# Patient Record
Sex: Male | Born: 1950
Health system: Southern US, Community
[De-identification: ages and names within clinical notes are randomized; demographics above are authoritative.]

## PROBLEM LIST (undated history)

## (undated) DIAGNOSIS — M159 Polyosteoarthritis, unspecified: Secondary | ICD-10-CM

## (undated) DIAGNOSIS — M1611 Unilateral primary osteoarthritis, right hip: Secondary | ICD-10-CM

## (undated) DIAGNOSIS — S63106A Unspecified dislocation of unspecified thumb, initial encounter: Secondary | ICD-10-CM

## (undated) DIAGNOSIS — M879 Osteonecrosis, unspecified: Secondary | ICD-10-CM

## (undated) DIAGNOSIS — M1990 Unspecified osteoarthritis, unspecified site: Secondary | ICD-10-CM

## (undated) DIAGNOSIS — R011 Cardiac murmur, unspecified: Secondary | ICD-10-CM

## (undated) DIAGNOSIS — K219 Gastro-esophageal reflux disease without esophagitis: Secondary | ICD-10-CM

## (undated) DIAGNOSIS — I10 Essential (primary) hypertension: Secondary | ICD-10-CM

## (undated) HISTORY — DX: Cardiac murmur, unspecified: R01.1

## (undated) HISTORY — DX: Gastro-esophageal reflux disease without esophagitis: K21.9

## (undated) HISTORY — DX: Polyosteoarthritis, unspecified: M15.9

## (undated) HISTORY — DX: Unspecified dislocation of unspecified thumb, initial encounter: S63.106A

## (undated) HISTORY — PX: NO PAST SURGERIES: SHX2092

## (undated) HISTORY — DX: Unilateral primary osteoarthritis, right hip: M16.11

## (undated) HISTORY — DX: Essential (primary) hypertension: I10

## (undated) HISTORY — DX: Unspecified osteoarthritis, unspecified site: M19.90

## (undated) HISTORY — DX: Osteonecrosis, unspecified: M87.9

---

## 2016-06-19 ENCOUNTER — Encounter: Payer: Self-pay | Admitting: Family Medicine

## 2016-06-19 ENCOUNTER — Ambulatory Visit (INDEPENDENT_AMBULATORY_CARE_PROVIDER_SITE_OTHER): Payer: BLUE CROSS/BLUE SHIELD | Admitting: Family Medicine

## 2016-06-19 VITALS — BP 162/66 | HR 81 | Temp 98.2°F | Resp 20 | Ht 66.0 in | Wt 219.0 lb

## 2016-06-19 DIAGNOSIS — R030 Elevated blood-pressure reading, without diagnosis of hypertension: Secondary | ICD-10-CM | POA: Diagnosis not present

## 2016-06-19 DIAGNOSIS — Z7689 Persons encountering health services in other specified circumstances: Secondary | ICD-10-CM

## 2016-06-19 DIAGNOSIS — Z6835 Body mass index (BMI) 35.0-35.9, adult: Secondary | ICD-10-CM | POA: Diagnosis not present

## 2016-06-19 DIAGNOSIS — L989 Disorder of the skin and subcutaneous tissue, unspecified: Secondary | ICD-10-CM | POA: Diagnosis not present

## 2016-06-19 DIAGNOSIS — E669 Obesity, unspecified: Secondary | ICD-10-CM | POA: Insufficient documentation

## 2016-06-19 DIAGNOSIS — Z87891 Personal history of nicotine dependence: Secondary | ICD-10-CM | POA: Insufficient documentation

## 2016-06-19 NOTE — Patient Instructions (Signed)
It was a pleasure meeting you today.  I hope you have a great holiday season.  I will place a referral to dermatology for you and they will call to set up an appt.   If you would like to have your fasting labs drawn before your physical appt let us know and schedule a lab appt 2 days prior to physical appt.   Please help [place] help you:  It is a privilege to be able to take care of great patients such as yourself. We are honored you have chosen [facility] for your Primary Care home. Below you will find basic instructions that you may need to access in the future. Please help [place] help you by reading the instructions, which cover many of the frequent questions we experience.   Prescription refills and request:  -In order to allow more efficient response time, please call your pharmacy for all refills. They will forward the request electronically to [place]. This allows for the quickest possible response. Request left on a nurse line can take longer to refill, since these are checked as time allows between office patients and other phone calls.  - refill request can take up to 3-5 working days to complete.  - If request is sent electronically and request is appropiate, it is usually completed in 1-2 business days.  - all patients will need to be seen routinely for all chronic medical conditions requiring prescription medications (see follow-up below). If you are overdue for follow up on your condition, you will be asked to make an appointment and we will call in enough medication to cover you until your appointment (up to 30 days).  - all controlled substances will require a face to face visit to request/refill.  - if you desire your prescriptions to go through a new pharmacy, and have an active script at original pharmacy, you will need to call your pharmacy and have scripts transferred to new pharmacy. This is completed between the pharmacy locations and not by your provider.    Results: If any images  or labs were ordered, it can take up to 1 week to get results depending on the test ordered and the lab/facility running and resulting the test. - Normal or stable results, which do not need further discussion, will be released to your mychart immediately with attached note to you. A call will not be generated for normal results. Please make certain to sign up for mychart. If you have questions on how to activate your mychart you can call the front office.  - If your results need further discussion, our office will attempt to contact you via phone, and if unable to reach you after 2 attempts, we will release your abnormal result to your mychart with instructions.  - All results will be automatically released in mychart after 1 week.  - Your provider will provide you with explanation and instruction on all relevant material in your results. Please keep in mind, results and labs may appear confusing or abnormal to the untrained eye, but it does not mean they are actually abnormal for you personally. If you have any questions about your results that are not covered, or you desire more detailed explanation than what was provided, you should make an appointment with your provider to do so.   Our office handles many outgoing and incoming calls daily. If we have not contacted you within 1 week about your results, please check your mychart to see if there is a  message first and if not, then contact our office.  In helping with this matter, you help decrease call volume, and therefore allow [place] to be able to respond to patients needs more efficiently.   Acute office visits (sick visit):  An acute visit is intended for a new problem and are scheduled in shorter time slots to allow schedule openings for patients with new problems. This is the appropriate visit to discuss a new problem. In order to provide you with excellent quality medical care with proper time for you to explain your problem, have an exam and receive  treatment with instructions, these appointments should be limited to one new problem per visit. If you experience a new problem, in which you desire to be addressed, please make an acute office visit, we save openings on the schedule to accommodate you. Please do not save your new problem for any other type of visit, let us take care of it properly and quickly for you.   Follow up visits:  Depending on your condition(s) your provider will need to see you routinely in order to provide you with quality care and prescribe medication(s). Most chronic conditions (Example: hypertension, Diabetes, depression/anxiety... etc), require visits a couple times a year. Your provider will instruct you on proper follow up for your personal medical conditions and history. Please make certain to make follow up appointments for your condition as instructed. Failing to do so could result in lapse in your medication treatment/refills. If you request a refill, and are overdue to be seen on a condition, we will always provide you with a 30 day script (once) to allow you time to schedule.    Medicare wellness (well visit): - we have a wonderful Nurse Maudie Mercury), that will meet with you and provide you will yearly medicare wellness visits. These visits should occur yearly (can not be scheduled less than 1 calendar year apart) and cover preventive health, immunizations, advance directives and screenings you are entitled to yearly through your medicare benefits. Do not miss out on your entitled benefits, this is when medicare will pay for these benefits to be ordered for you.  These are strongly encouraged by your provider and is the appropriate type of visit to make certain you are up to date with all preventive health benefits. If you have not had your medicare wellness exam in the last 12 months, please make certain to schedule one by calling the office and schedule your medicare wellness with Maudie Mercury as soon as possible.   Yearly physical  (well visit):  - Adults are recommended to be seen yearly for physicals. Check with your insurance and date of your last physical, most insurances require one calendar year between physicals. Physicals include all preventive health topics, screenings, medical exam and labs that are appropriate for gender/age and history. You may have fasting labs needed at this visit. This is a well visit (not a sick visit), acute topics should not be covered during this visit.  - Pediatric patients are seen more frequently when they are younger. Your provider will advise you on well child visit timing that is appropriate for your their age. - This is not a medicare wellness visit. Medicare wellness exams do not have an exam portion to the visit. Some medicare companies allow for a physical, some do not allow a yearly physical. If your medicare allows a yearly physical you can schedule the medicare wellness with our nurse Maudie Mercury and have your physical with your provider after, on the  same day. Please check with insurance for your full benefits.   Late Policy/No Shows:  - all new patients should arrive 15-30 minutes earlier than appointment to allow [place] time  to  obtain all personal demographics,  insurance information and for you to complete office paperwork. - All established patients should arrive 10-15 minutes earlier than appointment time to update all information and be checked in .  - In our best efforts to run on time, if you are late for your appointment you will be asked to either reschedule or if able, we will work you back into the schedule. There will be a wait time to work you back in the schedule,  depending on availability.  - If you are unable to make it to your appointment as scheduled, please call 24 hours ahead of time to allow [place] to fill the time slot with someone else who needs to be seen. If you do not cancel your appointment ahead of time, you may be charged a no show fee.

## 2016-06-19 NOTE — Progress Notes (Signed)
Patient ID: Paul Phillips, male  DOB: January 29, 1951, 65 y.o.   MRN: [id number] Patient Care Team    Relationship Specialty Notifications Start End  Ma Hillock, DO PCP - General Family Medicine  06/19/16     Subjective:  Paul Phillips is a 65 y.o.  male present for new patient establishment with complaints of skin lesion of his forehead.  All past medical history, surgical history, allergies, family history, immunizations, medications and social history were obtained and entered in the electronic medical record today. All recent labs, ED visits and hospitalizations within the last year were reviewed.  Patient states he has had a skin lesion of his left forehead for at least 2.5 years. He was seen in 2015 for the lesions by prior provider and told it was likely precancerous lesion. He was prescribed aldara cream he used twice a week for 12 weeks for multiple skin lesions of his face. He feels it has become more crusty and a little larger since that time. He denies prior personal hx or family hx of skin cancer. He admits to a good deal of sun exposure in is life. He also has a similar lesion on his right shoulder area.   Health maintenance:  Colonoscopy: Never. Immunizations: only UTD with influenza 2017 Infectious disease screening: HIV and Hep C  Needs offered at [facility]PE PSA: No results found for: PSA   Depression screen [facility] 2/9 06/19/2016  Decreased Interest 0  Down, Depressed, Hopeless 0  PHQ - 2 Score 0     There is no immunization history on file for this patient.   Past Medical History:  Diagnosis Date  . Heart murmur    No Known Allergies Past Surgical History:  Procedure Laterality Date  . NO PAST SURGERIES     Family History  Problem Relation Age of Onset  . Arthritis Mother   . Diabetes Mother   . Hearing loss Mother   . Stroke Mother   . Hearing loss Father   . Heart disease Father   . Aneurysm Sister     brain  . Heart attack Brother     . Arthritis Maternal Grandmother   . Cancer Maternal Grandfather    Social History   Social History  . Marital status: Married    Spouse name: Paul Phillips  . Number of children: 1  . Years of education: HS   Occupational History  . Electrician    Social History Main Topics  . Smoking status: Former Smoker    Packs/day: 1.00    Years: 30.00    Types: Cigarettes    Quit date: 07/07/1999  . Smokeless tobacco: Never Used  . Alcohol use 6.0 oz/week    10 Cans of beer per week  . Drug use: No  . Sexual activity: Yes    Partners: Female     Comment: married   Other Topics Concern  . Not on file   Social History Narrative   Married to [place]. 1 adult child Paul Phillips.   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    Former smoker, quit 2001   Allergies as of 06/19/2016   No Known Allergies     Medication List    as of 06/19/2016 11:11 AM   You have not been prescribed any medications.      No results found for this or any previous visit (from the past 2160 hour(s)).  Patient was never admitted.   ROS: 14 pt  review of systems performed and negative (unless mentioned in an HPI)  Objective: BP (!) 162/66 (BP Location: Left Arm, Patient Position: Sitting, Cuff Size: Large)   Pulse 81   Temp 98.2 F (36.8 C)   Resp 20   Ht 5\' 6"  (1.676 m)   Wt 219 lb (99.3 kg)   SpO2 97%   BMI 35.35 kg/m  Gen: Afebrile. No acute distress. Nontoxic in appearance, well-developed, well-nourished, obese male.  HEAD/SKIN: [place]. Multiple small scaly plaques forehead. Left upper forehead with 1 cm by 1.25 cm thick scaly lesion. Right shoulder with approximately 1.5 cm x 1 cm red hyperpigmented lesion with excoriations. Eyes:Pupils Equal Round Reactive to light, Extraocular movements intact,  Conjunctiva without redness, discharge or icterus. Neck/lymp/endocrine: Supple,no lymphadenopathy CV: RRR no murmur, no edema, +2/4 P posterior tibialis pulses.  Chest: CTAB, no wheeze, rhonchi or  crackles.  Abd: Soft. NTND. BS present.tenderness or guarding. Neuro/Msk: Normal gait. PERLA. EOMi. Alert. Oriented x3.   Psych: Normal affect, dress and demeanor. Normal speech. Normal thought content and judgment.   Assessment/plan: Paul Phillips is a 65 y.o. male present for new pt establishment with concerns of forehead lesion.  Skin lesion of face - Appears to be multiple actinic keratosis, with a concerning left forehead lesion and right shoulder lesion for skin cancer. Discussed dermatology referral with patient today, and he is agreeable. - Ambulatory referral to Dermatology  BMI 35.0-35.9,adult Elevated blood pressure reading - Discussed with patient diet and exercise. He reports normal blood pressures at home, and checks them a few times a week. Patient will have physical scheduled within the next few weeks, blood pressure will be rechecked at that time. - He is also to monitor at home if any routine blood pressure readings consistently above 140/90 he is to be seen sooner.    Return in about 2 weeks (around 07/03/2016).  Electronically signed by: Howard Pouch, DO [facility]

## 2016-07-01 ENCOUNTER — Other Ambulatory Visit (INDEPENDENT_AMBULATORY_CARE_PROVIDER_SITE_OTHER): Payer: BLUE CROSS/BLUE SHIELD

## 2016-07-01 DIAGNOSIS — Z1159 Encounter for screening for other viral diseases: Secondary | ICD-10-CM | POA: Diagnosis not present

## 2016-07-01 DIAGNOSIS — Z114 Encounter for screening for human immunodeficiency virus [HIV]: Secondary | ICD-10-CM | POA: Diagnosis not present

## 2016-07-01 DIAGNOSIS — Z0000 Encounter for general adult medical examination without abnormal findings: Secondary | ICD-10-CM | POA: Diagnosis not present

## 2016-07-01 DIAGNOSIS — Z130 Encounter for screening for diseases of the blood and blood-forming organs and certain disorders involving the immune mechanism: Secondary | ICD-10-CM | POA: Diagnosis not present

## 2016-07-01 DIAGNOSIS — Z125 Encounter for screening for malignant neoplasm of prostate: Secondary | ICD-10-CM

## 2016-07-01 DIAGNOSIS — Z13220 Encounter for screening for lipoid disorders: Secondary | ICD-10-CM

## 2016-07-01 DIAGNOSIS — Z131 Encounter for screening for diabetes mellitus: Secondary | ICD-10-CM

## 2016-07-01 LAB — LIPID PANEL
Cholesterol: 178 mg/dL (ref 0–200)
HDL: 55.5 mg/dL (ref >39.00)
LDL Cholesterol: 105 mg/dL — ABNORMAL HIGH (ref 0–99)
NONHDL: 122.23
Total CHOL/HDL Ratio: 3
Triglycerides: 84 mg/dL (ref 0.0–149.0)
VLDL: 16.8 mg/dL (ref 0.0–40.0)

## 2016-07-01 LAB — CBC WITH DIFFERENTIAL/PLATELET
Basophils Absolute: 0 K/uL (ref 0.0–0.1)
Basophils Relative: 0.4 % (ref 0.0–3.0)
Eosinophils Absolute: 0.1 K/uL (ref 0.0–0.7)
Eosinophils Relative: 1.6 % (ref 0.0–5.0)
HCT: 46.5 % (ref 39.0–52.0)
Hemoglobin: 15.9 g/dL (ref 13.0–17.0)
Lymphocytes Relative: 32.2 % (ref 12.0–46.0)
Lymphs Abs: 1.6 K/uL (ref 0.7–4.0)
MCHC: 34.3 g/dL (ref 30.0–36.0)
MCV: 88.5 fl (ref 78.0–100.0)
Monocytes Absolute: 0.6 K/uL (ref 0.1–1.0)
Monocytes Relative: 12 % (ref 3.0–12.0)
Neutro Abs: 2.7 K/uL (ref 1.4–7.7)
Neutrophils Relative %: 53.8 % (ref 43.0–77.0)
Platelets: 130 K/uL — ABNORMAL LOW (ref 150.0–400.0)
RBC: 5.26 Mil/uL (ref 4.22–5.81)
RDW: 13.8 % (ref 11.5–15.5)
WBC: 5.1 K/uL (ref 4.0–10.5)

## 2016-07-01 LAB — COMPREHENSIVE METABOLIC PANEL
ALBUMIN: 4.1 g/dL (ref 3.5–5.2)
ALK PHOS: 62 U/L (ref 39–117)
ALT: 13 U/L (ref 0–53)
AST: 12 U/L (ref 0–37)
BILIRUBIN TOTAL: 0.6 mg/dL (ref 0.2–1.2)
BUN: 15 mg/dL (ref 6–23)
CO2: 26 mEq/L (ref 19–32)
Calcium: 9.7 mg/dL (ref 8.4–10.5)
Chloride: 105 mEq/L (ref 96–112)
Creatinine, Ser: 0.94 mg/dL (ref 0.40–1.50)
GFR: 86.06 mL/min (ref >60.00)
Glucose, Bld: 93 mg/dL (ref 70–99)
POTASSIUM: 4.8 meq/L (ref 3.5–5.1)
SODIUM: 138 meq/L (ref 135–145)
TOTAL PROTEIN: 6.4 g/dL (ref 6.0–8.3)

## 2016-07-01 LAB — HEMOGLOBIN A1C: HEMOGLOBIN A1C: 5.2 % (ref 4.6–6.5)

## 2016-07-01 LAB — HEPATITIS C ANTIBODY: HCV Ab: NEGATIVE

## 2016-07-01 LAB — PSA: PSA: 1.39 ng/mL (ref 0.10–4.00)

## 2016-07-01 NOTE — Progress Notes (Signed)
Labs drawn

## 2016-07-02 LAB — HIV ANTIBODY (ROUTINE TESTING W REFLEX): HIV: NONREACTIVE

## 2016-07-03 ENCOUNTER — Ambulatory Visit (INDEPENDENT_AMBULATORY_CARE_PROVIDER_SITE_OTHER): Payer: BLUE CROSS/BLUE SHIELD | Admitting: Family Medicine

## 2016-07-03 ENCOUNTER — Encounter: Payer: Self-pay | Admitting: Family Medicine

## 2016-07-03 VITALS — BP 139/79 | HR 79 | Temp 98.5°F | Resp 20 | Ht 66.0 in | Wt 223.8 lb

## 2016-07-03 DIAGNOSIS — R030 Elevated blood-pressure reading, without diagnosis of hypertension: Secondary | ICD-10-CM

## 2016-07-03 DIAGNOSIS — Z23 Encounter for immunization: Secondary | ICD-10-CM | POA: Diagnosis not present

## 2016-07-03 DIAGNOSIS — Z0000 Encounter for general adult medical examination without abnormal findings: Secondary | ICD-10-CM | POA: Insufficient documentation

## 2016-07-03 DIAGNOSIS — Z6835 Body mass index (BMI) 35.0-35.9, adult: Secondary | ICD-10-CM

## 2016-07-03 DIAGNOSIS — Z87891 Personal history of nicotine dependence: Secondary | ICD-10-CM

## 2016-07-03 DIAGNOSIS — Z1211 Encounter for screening for malignant neoplasm of colon: Secondary | ICD-10-CM

## 2016-07-03 MED ORDER — ZOSTER VACCINE LIVE 19400 UNT/0.65ML ~~LOC~~ SUSR: 0.6500 mL | Freq: Once | SUBCUTANEOUS | 0 refills | Status: AC

## 2016-07-03 NOTE — Patient Instructions (Signed)
It was a pleasure seeing you again today.  Low salt/carbohydrate diet.  Monitor BP when able and make certain below [id number], if you see higher results routinely would want to see you.   I referred you to gastroenterology to have your colonoscopy completed. They will call you to schedule.   Try starting flonase nasal spray daily to help with nose/allergies. Avoid use of Afrin.   You received your pneumonia vaccine (sedond part next year) and Tetanus today.  Prescription for your shingles shot printed today.  Call multiple Pharmacy locations and compare prices.   I would recommend a baby aspirin daily.  Tylenol for arthritis.    Health Maintenance, Male A healthy lifestyle and preventative care can promote health and wellness.  Maintain regular health, dental, and eye exams.  Eat a healthy diet. Foods like vegetables, fruits, whole grains, low-fat dairy products, and lean protein foods contain the nutrients you need and are low in calories. Decrease your intake of foods high in solid fats, added sugars, and salt. Get information about a proper diet from your health care provider, if necessary.  Regular physical exercise is one of the most important things you can do for your health. Most adults should get at least 150 minutes of moderate-intensity exercise (any activity that increases your heart rate and causes you to sweat) each week. In addition, most adults need muscle-strengthening exercises on 2 or more days a week.   Maintain a healthy weight. The body mass index (BMI) is a screening tool to identify possible weight problems. It provides an estimate of body fat based on height and weight. Your health care provider can find your BMI and can help you achieve or maintain a healthy weight. For males 20 years and older:  A BMI below 18.5 is considered underweight.  A BMI of 18.5 to 24.9 is normal.  A BMI of 25 to 29.9 is considered overweight.  A BMI of 30 and above is considered  obese.  Maintain normal blood lipids and cholesterol by exercising and minimizing your intake of saturated fat. Eat a balanced diet with plenty of fruits and vegetables. Blood tests for lipids and cholesterol should begin at age 69 and be repeated every 5 years. If your lipid or cholesterol levels are high, you are over age 21, or you are at high risk for heart disease, you may need your cholesterol levels checked more frequently.Ongoing high lipid and cholesterol levels should be treated with medicines if diet and exercise are not working.  If you smoke, find out from your health care provider how to quit. If you do not use tobacco, do not start.  Lung cancer screening is recommended for adults aged 28-80 years who are at high risk for developing lung cancer because of a history of smoking. A yearly low-dose CT scan of the lungs is recommended for people who have at least a 30-pack-year history of smoking and are current smokers or have quit within the past 15 years. A pack year of smoking is smoking an average of 1 pack of cigarettes a day for 1 year (for example, a 30-pack-year history of smoking could mean smoking 1 pack a day for 30 years or 2 packs a day for 15 years). Yearly screening should continue until the smoker has stopped smoking for at least 15 years. Yearly screening should be stopped for people who develop a health problem that would prevent them from having lung cancer treatment.  If you choose to drink alcohol, do  not have more than 2 drinks per day. One drink is considered to be 12 oz (360 mL) of beer, 5 oz (150 mL) of wine, or 1.5 oz (45 mL) of liquor.  Avoid the use of street drugs. Do not share needles with anyone. Ask for help if you need support or instructions about stopping the use of drugs.  High blood pressure causes heart disease and increases the risk of stroke. High blood pressure is more likely to develop in:  People who have blood pressure in the end of the normal  range (100-139/85-89 mm Hg).  People who are overweight or obese.  People who are African American.  If you are 64-71 years of age, have your blood pressure checked every 3-5 years. If you are 61 years of age or older, have your blood pressure checked every year. You should have your blood pressure measured twice-once when you are at a hospital or clinic, and once when you are not at a hospital or clinic. Record the average of the two measurements. To check your blood pressure when you are not at a hospital or clinic, you can use:  An automated blood pressure machine at a pharmacy.  A home blood pressure monitor.  If you are 44-24 years old, ask your health care provider if you should take aspirin to prevent heart disease.  Diabetes screening involves taking a blood sample to check your fasting blood sugar level. This should be done once every 3 years after age 30 if you are at a normal weight and without risk factors for diabetes. Testing should be considered at a younger age or be carried out more frequently if you are overweight and have at least 1 risk factor for diabetes.  Colorectal cancer can be detected and often prevented. Most routine colorectal cancer screening begins at the age of 67 and continues through age 86. However, your health care provider may recommend screening at an earlier age if you have risk factors for colon cancer. On a yearly basis, your health care provider may provide home test kits to check for hidden blood in the stool. A small camera at the end of a tube may be used to directly examine the colon (sigmoidoscopy or colonoscopy) to detect the earliest forms of colorectal cancer. Talk to your health care provider about this at age 40 when routine screening begins. A direct exam of the colon should be repeated every 5-10 years through age 10, unless early forms of precancerous polyps or small growths are found.  People who are at an increased risk for hepatitis B should  be screened for this virus. You are considered at high risk for hepatitis B if:  You were born in a country where hepatitis B occurs often. Talk with your health care provider about which countries are considered high risk.  Your parents were born in a high-risk country and you have not received a shot to protect against hepatitis B (hepatitis B vaccine).  You have HIV or AIDS.  You use needles to inject street drugs.  You live with, or have sex with, someone who has hepatitis B.  You are a man who has sex with other men (MSM).  You get hemodialysis treatment.  You take certain medicines for conditions like cancer, organ transplantation, and autoimmune conditions.  Hepatitis C blood testing is recommended for all people born from [birth year] through 1963 and any individual with known risk factors for hepatitis C.  Healthy men should no longer receive  prostate-specific antigen (PSA) blood tests as part of routine cancer screening. Talk to your health care provider about prostate cancer screening.  Testicular cancer screening is not recommended for adolescents or adult males who have no symptoms. Screening includes self-exam, a health care provider exam, and other screening tests. Consult with your health care provider about any symptoms you have or any concerns you have about testicular cancer.  Practice safe sex. Use condoms and avoid high-risk sexual practices to reduce the spread of sexually transmitted infections (STIs).  You should be screened for STIs, including gonorrhea and chlamydia if:  You are sexually active and are younger than 24 years.  You are older than 24 years, and your health care provider tells you that you are at risk for this type of infection.  Your sexual activity has changed since you were last screened, and you are at an increased risk for chlamydia or gonorrhea. Ask your health care provider if you are at risk.  If you are at risk of being infected with HIV, it  is recommended that you take a prescription medicine daily to prevent HIV infection. This is called pre-exposure prophylaxis (PrEP). You are considered at risk if:  You are a man who has sex with other men (MSM).  You are a heterosexual man who is sexually active with multiple partners.  You take drugs by injection.  You are sexually active with a partner who has HIV.  Talk with your health care provider about whether you are at high risk of being infected with HIV. If you choose to begin PrEP, you should first be tested for HIV. You should then be tested every 3 months for as long as you are taking PrEP.  Use sunscreen. Apply sunscreen liberally and repeatedly throughout the day. You should seek shade when your shadow is shorter than you. Protect yourself by wearing long sleeves, pants, a wide-brimmed hat, and sunglasses year round whenever you are outdoors.  Tell your health care provider of new moles or changes in moles, especially if there is a change in shape or color. Also, tell your health care provider if a mole is larger than the size of a pencil eraser.  A one-time screening for abdominal aortic aneurysm (AAA) and surgical repair of large AAAs by ultrasound is recommended for men aged 11-75 years who are current or former smokers.  Stay current with your vaccines (immunizations). This information is not intended to replace advice given to you by your health care provider. Make sure you discuss any questions you have with your health care provider. Document Released: 12/19/2007 Document Revised: 07/13/2014 Document Reviewed: 03/26/2015 Elsevier Interactive Patient Education  2017 [employer].  Please help [place] help you:  It is a privilege to be able to take care of great patients such as yourself. We are honored you have chosen [facility] for your Primary Care home. Below you will find basic instructions that you may need to access in the future. Please help [place] help you by  reading the instructions, which cover many of the frequent questions we experience.   Prescription refills and request:  -In order to allow more efficient response time, please call your pharmacy for all refills. They will forward the request electronically to [place]. This allows for the quickest possible response. Request left on a nurse line can take longer to refill, since these are checked as time allows between office patients and other phone calls.  - refill request can take up to  3-5 working days to complete.  - If request is sent electronically and request is appropiate, it is usually completed in 1-2 business days.  - all patients will need to be seen routinely for all chronic medical conditions requiring prescription medications (see follow-up below). If you are overdue for follow up on your condition, you will be asked to make an appointment and we will call in enough medication to cover you until your appointment (up to 30 days).  - all controlled substances will require a face to face visit to request/refill.  - if you desire your prescriptions to go through a new pharmacy, and have an active script at original pharmacy, you will need to call your pharmacy and have scripts transferred to new pharmacy. This is completed between the pharmacy locations and not by your provider.    Results: If any images or labs were ordered, it can take up to 1 week to get results depending on the test ordered and the lab/facility running and resulting the test. - Normal or stable results, which do not need further discussion, will be released to your mychart immediately with attached note to you. A call will not be generated for normal results. Please make certain to sign up for mychart. If you have questions on how to activate your mychart you can call the front office.  - If your results need further discussion, our office will attempt to contact you via phone, and if unable to reach you after 2 attempts, we  will release your abnormal result to your mychart with instructions.  - All results will be automatically released in mychart after 1 week.  - Your provider will provide you with explanation and instruction on all relevant material in your results. Please keep in mind, results and labs may appear confusing or abnormal to the untrained eye, but it does not mean they are actually abnormal for you personally. If you have any questions about your results that are not covered, or you desire more detailed explanation than what was provided, you should make an appointment with your provider to do so.   Our office handles many outgoing and incoming calls daily. If we have not contacted you within 1 week about your results, please check your mychart to see if there is a message first and if not, then contact our office.  In helping with this matter, you help decrease call volume, and therefore allow [place] to be able to respond to patients needs more efficiently.   Acute office visits (sick visit):  An acute visit is intended for a new problem and are scheduled in shorter time slots to allow schedule openings for patients with new problems. This is the appropriate visit to discuss a new problem. In order to provide you with excellent quality medical care with proper time for you to explain your problem, have an exam and receive treatment with instructions, these appointments should be limited to one new problem per visit. If you experience a new problem, in which you desire to be addressed, please make an acute office visit, we save openings on the schedule to accommodate you. Please do not save your new problem for any other type of visit, let us take care of it properly and quickly for you.   Follow up visits:  Depending on your condition(s) your provider will need to see you routinely in order to provide you with quality care and prescribe medication(s). Most chronic conditions (Example: hypertension, Diabetes,  depression/anxiety... etc), require  visits a couple times a year. Your provider will instruct you on proper follow up for your personal medical conditions and history. Please make certain to make follow up appointments for your condition as instructed. Failing to do so could result in lapse in your medication treatment/refills. If you request a refill, and are overdue to be seen on a condition, we will always provide you with a 30 day script (once) to allow you time to schedule.    Medicare wellness (well visit): - we have a wonderful Nurse Maudie Mercury), that will meet with you and provide you will yearly medicare wellness visits. These visits should occur yearly (can not be scheduled less than 1 calendar year apart) and cover preventive health, immunizations, advance directives and screenings you are entitled to yearly through your medicare benefits. Do not miss out on your entitled benefits, this is when medicare will pay for these benefits to be ordered for you.  These are strongly encouraged by your provider and is the appropriate type of visit to make certain you are up to date with all preventive health benefits. If you have not had your medicare wellness exam in the last 12 months, please make certain to schedule one by calling the office and schedule your medicare wellness with Maudie Mercury as soon as possible.   Yearly physical (well visit):  - Adults are recommended to be seen yearly for physicals. Check with your insurance and date of your last physical, most insurances require one calendar year between physicals. Physicals include all preventive health topics, screenings, medical exam and labs that are appropriate for gender/age and history. You may have fasting labs needed at this visit. This is a well visit (not a sick visit), acute topics should not be covered during this visit.  - Pediatric patients are seen more frequently when they are younger. Your provider will advise you on well child visit timing that  is appropriate for your their age. - This is not a medicare wellness visit. Medicare wellness exams do not have an exam portion to the visit. Some medicare companies allow for a physical, some do not allow a yearly physical. If your medicare allows a yearly physical you can schedule the medicare wellness with our nurse Maudie Mercury and have your physical with your provider after, on the same day. Please check with insurance for your full benefits.   Late Policy/No Shows:  - all new patients should arrive 15-30 minutes earlier than appointment to allow [place] time  to  obtain all personal demographics,  insurance information and for you to complete office paperwork. - All established patients should arrive 10-15 minutes earlier than appointment time to update all information and be checked in .  - In our best efforts to run on time, if you are late for your appointment you will be asked to either reschedule or if able, we will work you back into the schedule. There will be a wait time to work you back in the schedule,  depending on availability.  - If you are unable to make it to your appointment as scheduled, please call 24 hours ahead of time to allow [place] to fill the time slot with someone else who needs to be seen. If you do not cancel your appointment ahead of time, you may be charged a no show fee.

## 2016-07-03 NOTE — Progress Notes (Signed)
Patient ID: Paul Phillips, male  DOB: [birth year], 65 y.o.   MRN: [id number] Patient Care Team    Relationship Specialty Notifications Start End  Ma Hillock, DO PCP - General Family Medicine  06/19/16     Subjective:  Paul Phillips is a 65 y.o. male present for CPE. All past medical history, surgical history, allergies, family history, immunizations, medications and social history were in the electronic medical record today. All recent labs, ED visits and hospitalizations within the last year were reviewed.  Health maintenance:  Colonoscopy: Never.  No fhx. Referral made time.  Immunizations:  tdap given today, influenza UTD 2017, Prevnar 13, zostavax printed today Infectious disease screening: HIV and Hep C negative. Lab Results  Component Value Date   PSA 1.39 07/01/2016  , pt was counseled on prostate cancer screenings. Urinary stream unchanged.  Assistive device: none Oxygen LUN:GBMB Patient has a Dental home. Hospitalizations/ED visits: none  Immunization History  Administered Date(s) Administered  . Hepatitis B, ped/adol 09/22/2013  . Pneumococcal Conjugate-13 07/03/2016  . Tdap 07/03/2016     Past Medical History:  Diagnosis Date  . Heart murmur    No Known Allergies Past Surgical History:  Procedure Laterality Date  . NO PAST SURGERIES     Family History  Problem Relation Age of Onset  . Arthritis Mother   . Diabetes Mother   . Hearing loss Mother   . Stroke Mother   . Hearing loss Father   . Heart disease Father   . AAA (abdominal aortic aneurysm) Father   . Aneurysm Sister 22    brain, died from aneurysm suddenly.   [name] attack Brother   . Arthritis/Rheumatoid Maternal Grandmother   . Brain cancer Maternal Grandfather    Social History   Social History  . Marital status: Married    Spouse name: Paul Phillips  . Number of children: 1  . Years of education: 13   Occupational History  . Electrician    Social History  Main Topics  . Smoking status: Former Smoker    Packs/day: 1.00    Years: 30.00    Types: Cigarettes    Quit date: 07/07/1999  . Smokeless tobacco: Never Used  . Alcohol use 6.0 oz/week    10 Cans of beer per week  . Drug use: No  . Sexual activity: Yes    Partners: Female     Comment: married   Other Topics Concern  . Not on file   Social History Narrative   Married to [place]. 1 adult child [place].   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    Former smoker, quit 2001   Drinks caffeine, takes a daily vitamin.   Wear seatbelt. Smoke detector in the home. Firearms in the home.   safe in his relationships.   Allergies as of 07/03/2016   No Known Allergies     Medication List       Accurate as of 07/03/16 12:17 PM. Always use your most recent med list.          Zoster Vaccine Live (PF) 19400 UNT/0.65ML injection Commonly known as:  ZOSTAVAX Inject 19,400 Units into the skin once.        Recent Results (from the past 2160 hour(s))  Hepatitis C antibody     Status: None   Collection Time: 07/01/16 11:28 AM  Result Value Ref Range   HCV Ab NEGATIVE NEGATIVE  HIV antibody     Status:  None   Collection Time: 07/01/16 11:28 AM  Result Value Ref Range   HIV 1&2 Ab, 4th Generation NONREACTIVE NONREACTIVE    Comment:   HIV-1 antigen and HIV-1/HIV-2 antibodies were not detected.  There is no laboratory evidence of HIV infection.   HIV-1/2 Antibody Diff        Not indicated. HIV-1 RNA, Qual TMA          Not indicated.     PLEASE NOTE: This information has been disclosed to you from records whose confidentiality may be protected by state law. If your state requires such protection, then the state law prohibits you from making any further disclosure of the information without the specific written consent of the person to whom it pertains, or as otherwise permitted by law. A general authorization for the release of medical or other information is NOT sufficient  for this purpose.   The performance of this assay has not been clinically validated in patients less than 42 years old.   For additional information please refer to http://education.[facility].com/faq/FAQ106.  (This link is being provided for informational/educational purposes only.)     PSA     Status: None   Collection Time: 07/01/16 11:28 AM  Result Value Ref Range   PSA 1.39 0.10 - 4.00 ng/mL  CBC w/Diff     Status: Abnormal   Collection Time: 07/01/16 11:28 AM  Result Value Ref Range   WBC 5.1 4.0 - 10.5 K/uL   RBC 5.26 4.22 - 5.81 Mil/uL   Hemoglobin 15.9 13.0 - 17.0 g/dL   HCT 46.5 39.0 - 52.0 %   MCV 88.5 78.0 - 100.0 fl   MCHC 34.3 30.0 - 36.0 g/dL   RDW 13.8 11.5 - 15.5 %   Platelets 130.0 (L) 150.0 - 400.0 K/uL   Neutrophils Relative % 53.8 43.0 - 77.0 %   Lymphocytes Relative 32.2 12.0 - 46.0 %   Monocytes Relative 12.0 3.0 - 12.0 %   Eosinophils Relative 1.6 0.0 - 5.0 %   Basophils Relative 0.4 0.0 - 3.0 %   Neutro Abs 2.7 1.4 - 7.7 K/uL   Lymphs Abs 1.6 0.7 - 4.0 K/uL   Monocytes Absolute 0.6 0.1 - 1.0 K/uL   Eosinophils Absolute 0.1 0.0 - 0.7 K/uL   Basophils Absolute 0.0 0.0 - 0.1 K/uL  Comp Met (CMET)     Status: None   Collection Time: 07/01/16 11:28 AM  Result Value Ref Range   Sodium 138 135 - 145 mEq/L   Potassium 4.8 3.5 - 5.1 mEq/L   Chloride 105 96 - 112 mEq/L   CO2 26 19 - 32 mEq/L   Glucose, Bld 93 70 - 99 mg/dL   BUN 15 6 - 23 mg/dL   Creatinine, Ser 0.94 0.40 - 1.50 mg/dL   Total Bilirubin 0.6 0.2 - 1.2 mg/dL   Alkaline Phosphatase 62 39 - 117 U/L   AST 12 0 - 37 U/L   ALT 13 0 - 53 U/L   Total Protein 6.4 6.0 - 8.3 g/dL   Albumin 4.1 3.5 - 5.2 g/dL   Calcium 9.7 8.4 - 10.5 mg/dL   GFR 86.06 >60.00 mL/min  Lipid panel     Status: Abnormal   Collection Time: 07/01/16 11:28 AM  Result Value Ref Range   Cholesterol 178 0 - 200 mg/dL    Comment: ATP III Classification       Desirable:  < 200 mg/dL  Borderline High:   200 - 239 mg/dL          High:  > = 240 mg/dL   Triglycerides 84.0 0.0 - 149.0 mg/dL    Comment: Normal:  <150 mg/dLBorderline High:  150 - 199 mg/dL   HDL 55.50 >39.00 mg/dL   VLDL 16.8 0.0 - 40.0 mg/dL   LDL Cholesterol 105 (H) 0 - 99 mg/dL   Total CHOL/HDL Ratio 3     Comment:                Men          Women1/2 Average Risk     3.4          3.3Average Risk          5.0          4.42X Average Risk          9.6          7.13X Average Risk          15.0          11.0                       NonHDL 122.23     Comment: NOTE:  Non-HDL goal should be 30 mg/dL higher than patient's LDL goal (i.e. LDL goal of < 70 mg/dL, would have non-HDL goal of < 100 mg/dL)  HgB A1c     Status: None   Collection Time: 07/01/16 11:28 AM  Result Value Ref Range   Hgb A1c MFr Bld 5.2 4.6 - 6.5 %    Comment: Glycemic Control Guidelines for People with Diabetes:Non Diabetic:  <6%Goal of Therapy: <7%Additional Action Suggested:  >8%     Patient was never admitted.   ROS: 14 pt review of systems performed and negative (unless mentioned in an HPI)  Objective: BP 139/79 (BP Location: Right Arm, Patient Position: Sitting, Cuff Size: Large)   Pulse 79   Temp 98.5 F (36.9 C)   Resp 20   Ht '5\' 6"'$  (1.676 m)   Wt 223 lb 12 oz (101.5 kg)   SpO2 97%   BMI 36.11 kg/m  Gen: Afebrile. No acute distress. Nontoxic in appearance, well-developed, well-nourished,  Obese, pleasant male.  HENT: AT. [place]. Bilateral TM visualized and normal in appearance, normal external auditory canal. MMM, no oral lesions, adequate dentition. Bilateral nares within normal limits (mild erythema) . Throat without erythema, ulcerations or exudates. no Cough on exam, no hoarseness on exam. Eyes:Pupils Equal Round Reactive to light, Extraocular movements intact,  Conjunctiva without redness, discharge or icterus. Neck/lymp/endocrine: Supple,no lymphadenopathy, no thyromegaly CV: RRR , no edema, +2/4 P posterior tibialis pulses. no carotid bruits.  No JVD. Chest: CTAB, no wheeze, rhonchi or crackles. normal Respiratory effort. good Air movement. Abd: Soft. obese. NTND. BS present. no Masses palpated. No hepatosplenomegaly. No rebound tenderness or guarding. Skin: no rashes, purpura or petechiae. Warm and well-perfused. Skin intact. Neuro/Msk:  Normal gait. PERLA. EOMi. Alert. Oriented x3.  Cranial nerves II through XII intact. Muscle strength 5/5 upper/lower extremity. DTRs equal bilaterally. Psych: Normal affect, dress and demeanor. Normal speech. Normal thought content and judgment.   Assessment/plan: Manolo Bosket is a 65 y.o. male present for CPE. Encounter for preventive health examination Patient was encouraged to exercise greater than 150 minutes a week. Patient was encouraged to choose a diet filled with fresh fruits and vegetables, and lean meats. AVS provided to patient today  for education/recommendation on gender specific health and safety maintenance. Health maintenance:  Colonoscopy: Never.  No fhx. Referral made time.  Immunizations:  tdap given today, influenza UTD 2017, Prevnar 13 today, zostavax printed today Infectious disease screening: HIV and Hep C negative. Elevated blood pressure reading/BMI 35.0-35.9,adult - low salt, lower carb, exercise > 150 min week.  - pt to monitor and if routinely above 140/80 he is to make an appt.  - He would like to try to lose weight first, if he does not start to lose weight by March or gains weight, I would like to see him in March.  Former heavy tobacco smoker/FHX AAA - discussed AAA screen at 65 yo - consider baby ASA, if does not increase bleeding.  Colon cancer screening - Ambulatory referral to Gastroenterology; never screened  Return in about 1 year (around 07/03/2017) for CPE.  Electronically signed by: Howard Pouch, DO [place]

## 2016-08-12 DIAGNOSIS — L570 Actinic keratosis: Secondary | ICD-10-CM | POA: Diagnosis not present

## 2016-08-12 DIAGNOSIS — C44329 Squamous cell carcinoma of skin of other parts of face: Secondary | ICD-10-CM | POA: Diagnosis not present

## 2016-08-12 DIAGNOSIS — D2239 Melanocytic nevi of other parts of face: Secondary | ICD-10-CM | POA: Diagnosis not present

## 2016-08-12 DIAGNOSIS — L821 Other seborrheic keratosis: Secondary | ICD-10-CM | POA: Diagnosis not present

## 2016-08-14 ENCOUNTER — Encounter: Payer: Self-pay | Admitting: Gastroenterology

## 2016-10-12 ENCOUNTER — Ambulatory Visit (AMBULATORY_SURGERY_CENTER): Payer: Self-pay

## 2016-10-12 VITALS — Ht 67.0 in | Wt 215.0 lb

## 2016-10-12 DIAGNOSIS — Z1211 Encounter for screening for malignant neoplasm of colon: Secondary | ICD-10-CM

## 2016-10-12 MED ORDER — SUPREP BOWEL PREP KIT 17.5-3.13-1.6 GM/177ML PO SOLN: 1.0000 | Freq: Once | ORAL | 0 refills | Status: AC

## 2016-10-12 NOTE — Progress Notes (Signed)
No allergies to eggs or soy No diet meds No home oxygen No past problems with anesthesia  Registered emmi 

## 2016-10-16 ENCOUNTER — Encounter: Payer: Self-pay | Admitting: Gastroenterology

## 2016-10-26 ENCOUNTER — Encounter: Payer: Self-pay | Admitting: Gastroenterology

## 2016-10-26 ENCOUNTER — Ambulatory Visit (AMBULATORY_SURGERY_CENTER): Payer: BLUE CROSS/BLUE SHIELD | Admitting: Gastroenterology

## 2016-10-26 VITALS — BP 127/68 | HR 66 | Temp 99.1°F | Resp 11 | Ht 66.0 in | Wt 223.0 lb

## 2016-10-26 DIAGNOSIS — D122 Benign neoplasm of ascending colon: Secondary | ICD-10-CM | POA: Diagnosis not present

## 2016-10-26 DIAGNOSIS — K621 Rectal polyp: Secondary | ICD-10-CM

## 2016-10-26 DIAGNOSIS — K635 Polyp of colon: Secondary | ICD-10-CM | POA: Diagnosis not present

## 2016-10-26 DIAGNOSIS — D125 Benign neoplasm of sigmoid colon: Secondary | ICD-10-CM

## 2016-10-26 DIAGNOSIS — D123 Benign neoplasm of transverse colon: Secondary | ICD-10-CM | POA: Diagnosis not present

## 2016-10-26 DIAGNOSIS — Z1212 Encounter for screening for malignant neoplasm of rectum: Secondary | ICD-10-CM

## 2016-10-26 DIAGNOSIS — Z1211 Encounter for screening for malignant neoplasm of colon: Secondary | ICD-10-CM

## 2016-10-26 DIAGNOSIS — D124 Benign neoplasm of descending colon: Secondary | ICD-10-CM

## 2016-10-26 DIAGNOSIS — D128 Benign neoplasm of rectum: Secondary | ICD-10-CM

## 2016-10-26 HISTORY — PX: COLONOSCOPY: SHX174

## 2016-10-26 MED ORDER — SODIUM CHLORIDE 0.9 % IV SOLN: 500.0000 mL | INTRAVENOUS | Status: DC

## 2016-10-26 NOTE — Progress Notes (Signed)
A/ox3, pleased with MAC, report to RN 

## 2016-10-26 NOTE — Progress Notes (Signed)
Pt. Reports no change in medical or surgical history since pre-visit 10/12/2016.

## 2016-10-26 NOTE — Progress Notes (Signed)
Called to room to assist during endoscopic procedure.  Patient ID and intended procedure confirmed with present staff. Received instructions for my participation in the procedure from the performing physician.  

## 2016-10-26 NOTE — Patient Instructions (Signed)
YOU HAD AN ENDOSCOPIC PROCEDURE TODAY AT THE [facility]:   Refer to the procedure report that was given to you for any specific questions about what was found during the examination.  If the procedure report does not answer your questions, please call your gastroenterologist to clarify.  If you requested that your care partner not be given the details of your procedure findings, then the procedure report has been included in a sealed envelope for you to review at your convenience later.  YOU SHOULD EXPECT: Some feelings of bloating in the abdomen. Passage of more gas than usual.  Walking can help get rid of the air that was put into your GI tract during the procedure and reduce the bloating. If you had a lower endoscopy (such as a colonoscopy or flexible sigmoidoscopy) you may notice spotting of blood in your stool or on the toilet paper. If you underwent a bowel prep for your procedure, you may not have a normal bowel movement for a few days.  Please Note:  You might notice some irritation and congestion in your nose or some drainage.  This is from the oxygen used during your procedure.  There is no need for concern and it should clear up in a day or so.  SYMPTOMS TO REPORT IMMEDIATELY:   Following lower endoscopy (colonoscopy or flexible sigmoidoscopy):  Excessive amounts of blood in the stool  Significant tenderness or worsening of abdominal pains  Swelling of the abdomen that is new, acute  Fever of 100F or higher  For urgent or emergent issues, a gastroenterologist can be reached at any hour by calling [phone].   DIET:  We do recommend a small meal at first, but then you may proceed to your regular diet.  Drink plenty of fluids but you should avoid alcoholic beverages for 24 hours.  MEDICATIONS:  Continue present medications.  ACTIVITY:  You should plan to take it easy for the rest of today and you should NOT DRIVE or use heavy machinery until tomorrow (because of the  sedation medicines used during the test).    FOLLOW UP: Our staff will call the number listed on your records the next business day following your procedure to check on you and address any questions or concerns that you may have regarding the information given to you following your procedure. If we do not reach you, we will leave a message.  However, if you are feeling well and you are not experiencing any problems, there is no need to return our call.  We will assume that you have returned to your regular daily activities without incident.  If any biopsies were taken you will be contacted by phone or by letter within the next 1-3 weeks.  Please call us at [phone] if you have not heard about the biopsies in 3 weeks.   Thank you for allowing us to provide for your healthcare needs today.   SIGNATURES/CONFIDENTIALITY: You and/or your care partner have signed paperwork which will be entered into your electronic medical record.  These signatures attest to the fact that that the information above on your After Visit Summary has been reviewed and is understood.  Full responsibility of the confidentiality of this discharge information lies with you and/or your care-partner. 

## 2016-10-26 NOTE — Op Note (Signed)
[facility] Patient Name: Paul Phillips Procedure Date: 10/26/2016 10:30 AM MRN: [id number] Endoscopist: Ladene Artist , MD Age: 66 Referring MD:  Date of Birth: 07/24/1950 Gender: Male Account #: [id number] Procedure:                Colonoscopy Indications:              Screening for colorectal malignant neoplasm Medicines:                Monitored Anesthesia Care Procedure:                Pre-Anesthesia Assessment:                           - Prior to the procedure, a History and Physical                            was performed, and patient medications and                            allergies were reviewed. The patient's tolerance of                            previous anesthesia was also reviewed. The risks                            and benefits of the procedure and the sedation                            options and risks were discussed with the patient.                            All questions were answered, and informed consent                            was obtained. Prior Anticoagulants: The patient has                            taken no previous anticoagulant or antiplatelet                            agents. ASA Grade Assessment: II - A patient with                            mild systemic disease. After reviewing the risks                            and benefits, the patient was deemed in                            satisfactory condition to undergo the procedure.                           After obtaining informed consent, the colonoscope  was passed under direct vision. Throughout the                            procedure, the patient's blood pressure, pulse, and                            oxygen saturations were monitored continuously. The                            Model PCF-H190DL [id number]) scope was introduced                            through the anus and advanced to the the cecum,   identified by appendiceal orifice and ileocecal                            valve. The ileocecal valve, appendiceal orifice,                            and rectum were photographed. The quality of the                            bowel preparation was good. The colonoscopy was                            performed without difficulty. The patient tolerated                            the procedure well. Scope In: 10:35:24 AM Scope Out: 10:49:46 AM Scope Withdrawal Time: 0 hours 13 minutes 8 seconds  Total Procedure Duration: 0 hours 14 minutes 22 seconds  Findings:                 The perianal and digital rectal examinations were                            normal.                           Five sessile polyps were found in the rectum,                            sigmoid colon, descending colon, transverse colon                            and ascending colon. The polyps were 6 to 8 mm in                            size. These polyps were removed with a cold snare.                            Resection and retrieval were complete.                           Internal hemorrhoids were found during  retroflexion. The hemorrhoids were small and Grade                            I (internal hemorrhoids that do not prolapse).                           The exam was otherwise without abnormality on                            direct and retroflexion views. Complications:            No immediate complications. Estimated blood loss:                            None. Estimated Blood Loss:     Estimated blood loss: none. Impression:               - Five 6 to 8 mm polyps in the rectum, in the                            sigmoid colon, in the descending colon, in the                            transverse colon and in the ascending colon,                            removed with a cold snare. Resected and retrieved.                           - Internal hemorrhoids.                            - The examination was otherwise normal on direct                            and retroflexion views. Recommendation:           - Repeat colonoscopy in 3 - 5 years for                            surveillance pending pathology review.                           - Patient has a contact number available for                            emergencies. The signs and symptoms of potential                            delayed complications were discussed with the                            patient. Return to normal activities tomorrow.                            Written  discharge instructions were provided to the                            patient.                           - Resume previous diet.                           - Continue present medications.                           - Await pathology results. Ladene Artist, MD 10/26/2016 10:54:40 AM This report has been signed electronically.

## 2016-10-27 ENCOUNTER — Telehealth: Payer: Self-pay | Admitting: *Deleted

## 2016-10-27 NOTE — Telephone Encounter (Signed)
  Follow up Call-  Call back number 10/26/2016  Post procedure Call Back phone  # [phone]  or [phone]  Permission to leave phone message Yes  Some recent data might be hidden    [facility]

## 2016-10-27 NOTE — Telephone Encounter (Signed)
Message left at [phone] and [phone].

## 2016-11-12 ENCOUNTER — Encounter: Payer: Self-pay | Admitting: Gastroenterology

## 2016-11-16 ENCOUNTER — Encounter: Payer: Self-pay | Admitting: Family Medicine

## 2016-11-16 DIAGNOSIS — K635 Polyp of colon: Secondary | ICD-10-CM | POA: Insufficient documentation

## 2017-03-02 DIAGNOSIS — Z08 Encounter for follow-up examination after completed treatment for malignant neoplasm: Secondary | ICD-10-CM | POA: Diagnosis not present

## 2017-03-02 DIAGNOSIS — D485 Neoplasm of uncertain behavior of skin: Secondary | ICD-10-CM | POA: Diagnosis not present

## 2017-03-02 DIAGNOSIS — C44329 Squamous cell carcinoma of skin of other parts of face: Secondary | ICD-10-CM | POA: Diagnosis not present

## 2017-03-02 DIAGNOSIS — Z85828 Personal history of other malignant neoplasm of skin: Secondary | ICD-10-CM | POA: Diagnosis not present

## 2017-03-02 DIAGNOSIS — L570 Actinic keratosis: Secondary | ICD-10-CM | POA: Diagnosis not present

## 2017-03-02 DIAGNOSIS — L821 Other seborrheic keratosis: Secondary | ICD-10-CM | POA: Diagnosis not present

## 2017-05-04 DIAGNOSIS — C44329 Squamous cell carcinoma of skin of other parts of face: Secondary | ICD-10-CM | POA: Diagnosis not present

## 2017-07-05 ENCOUNTER — Encounter: Payer: BLUE CROSS/BLUE SHIELD | Admitting: Family Medicine

## 2017-07-07 ENCOUNTER — Ambulatory Visit (INDEPENDENT_AMBULATORY_CARE_PROVIDER_SITE_OTHER): Payer: BLUE CROSS/BLUE SHIELD | Admitting: Family Medicine

## 2017-07-07 ENCOUNTER — Encounter: Payer: Self-pay | Admitting: Family Medicine

## 2017-07-07 VITALS — BP 140/60 | HR 98 | Temp 99.1°F | Ht 65.8 in | Wt 228.0 lb

## 2017-07-07 DIAGNOSIS — Z13220 Encounter for screening for lipoid disorders: Secondary | ICD-10-CM

## 2017-07-07 DIAGNOSIS — Z0000 Encounter for general adult medical examination without abnormal findings: Secondary | ICD-10-CM

## 2017-07-07 DIAGNOSIS — K635 Polyp of colon: Secondary | ICD-10-CM | POA: Diagnosis not present

## 2017-07-07 DIAGNOSIS — E781 Pure hyperglyceridemia: Secondary | ICD-10-CM | POA: Insufficient documentation

## 2017-07-07 DIAGNOSIS — R030 Elevated blood-pressure reading, without diagnosis of hypertension: Secondary | ICD-10-CM | POA: Diagnosis not present

## 2017-07-07 DIAGNOSIS — Z125 Encounter for screening for malignant neoplasm of prostate: Secondary | ICD-10-CM

## 2017-07-07 DIAGNOSIS — Z87891 Personal history of nicotine dependence: Secondary | ICD-10-CM | POA: Diagnosis not present

## 2017-07-07 DIAGNOSIS — Z130 Encounter for screening for diseases of the blood and blood-forming organs and certain disorders involving the immune mechanism: Secondary | ICD-10-CM

## 2017-07-07 DIAGNOSIS — E669 Obesity, unspecified: Secondary | ICD-10-CM

## 2017-07-07 DIAGNOSIS — Z131 Encounter for screening for diabetes mellitus: Secondary | ICD-10-CM | POA: Diagnosis not present

## 2017-07-07 MED ORDER — ZOSTER VAC RECOMB ADJUVANTED 50 MCG/0.5ML IM SUSR: 0.5000 mL | Freq: Once | INTRAMUSCULAR | 1 refills | Status: AC

## 2017-07-07 NOTE — Progress Notes (Signed)
Patient ID: Paul Phillips, male  DOB: 09/07/1950, 67 y.o.   MRN: [id number] Patient Care Team    Relationship Specialty Notifications Start End  Ma Hillock, DO PCP - General Family Medicine  06/19/16     Subjective:  Paul Phillips is a 67 y.o. male present for CPE. All past medical history, surgical history, allergies, family history, immunizations, medications and social history were updated in the electronic medical record today. All recent labs, ED visits and hospitalizations within the last year were reviewed.  Health maintenance: Updated 07/07/2017 Colonoscopy: Completed 10/26/2016, by Dr. Fuller Plan. Multiple polyps, 3 year follow-up recommended. Immunizations:  tdap UTD 2017, influenza UTD 2018, Prevnar 13 07/03/2016 (PSV23 next year at 10) , shingrix printed today Infectious disease screening: HIV and Hep C negative. Lab Results  Component Value Date   PSA 1.2 07/07/2017   PSA 1.39 07/01/2016  , pt was counseled on prostate cancer screenings. Urinary stream unchanged.  Assistive device: none Oxygen GMW:NUUV Patient has a Dental home. Hospitalizations/ED visits: Reviewed  Immunization History  Administered Date(s) Administered  . Hepatitis B, ped/adol 09/22/2013  . Influenza-Unspecified 04/05/2017  . Pneumococcal Conjugate-13 07/03/2016  . Tdap 07/03/2016     Past Medical History:  Diagnosis Date  . Arthritis   . Dislocated thumb   . GERD (gastroesophageal reflux disease)   . Heart murmur    No Known Allergies Past Surgical History:  Procedure Laterality Date  . NO PAST SURGERIES     Family History  Problem Relation Age of Onset  . Arthritis Mother   . Diabetes Mother   . Hearing loss Mother   . Stroke Mother   . Hearing loss Father   . Heart disease Father   . AAA (abdominal aortic aneurysm) Father   . Aneurysm Sister 44       brain, died from aneurysm suddenly.   [name] attack Brother   . Arthritis/Rheumatoid Maternal  Grandmother   . Brain cancer Maternal Grandfather   . Colon cancer Neg Hx    Social History   Socioeconomic History  . Marital status: Married    Spouse name: Paul Phillips  . Number of children: 1  . Years of education: 58  . Highest education level: Not on file  Social Needs  . Financial resource strain: Not on file  . Food insecurity - worry: Not on file  . Food insecurity - inability: Not on file  . Transportation needs - medical: Not on file  . Transportation needs - non-medical: Not on file  Occupational History  . Occupation: Clinical biochemist  Tobacco Use  . Smoking status: Former Smoker    Packs/day: 1.00    Years: 30.00    Pack years: 30.00    Types: Cigarettes    Last attempt to quit: 07/07/1999    Years since quitting: 18.0  . Smokeless tobacco: Never Used  Substance and Sexual Activity  . Alcohol use: Yes    Alcohol/week: 6.0 oz    Types: 10 Cans of beer per week  . Drug use: No  . Sexual activity: Yes    Partners: Female    Comment: married  Other Topics Concern  . Not on file  Social History Narrative   Married to [place]. 1 adult child [place].   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    Former smoker, quit 2001   Drinks caffeine, takes a daily vitamin.   Wear seatbelt. Smoke detector in the home. Firearms in the  home.   safe in his relationships.   Allergies as of 07/07/2017   No Known Allergies     Medication List        Accurate as of 07/07/17 11:59 PM. Always use your most recent med list.          acetaminophen 325 MG tablet Commonly known as:  TYLENOL Take 650 mg by mouth every 8 (eight) hours.   aspirin EC 81 MG tablet Take 81 mg by mouth daily.   Biotin 10 MG Tabs Take by mouth.   ranitidine 150 MG capsule Commonly known as:  ZANTAC Take 150 mg by mouth 2 (two) times daily.   vitamin B-12 1000 MCG tablet Commonly known as:  CYANOCOBALAMIN Take 1,000 mcg by mouth daily.   Zoster Vaccine Adjuvanted injection Commonly known as:   SHINGRIX Inject 0.5 mLs into the muscle once for 1 dose. Repeat dose once in 2-6 months .        Recent Results (from the past 2160 hour(s))  CBC w/Diff     Status: Abnormal   Collection Time: 07/07/17  3:50 PM  Result Value Ref Range   WBC 6.7 3.8 - 10.8 Thousand/uL   RBC 5.42 4.20 - 5.80 Million/uL   Hemoglobin 16.3 13.2 - 17.1 g/dL   HCT 46.7 38.5 - 50.0 %   MCV 86.2 80.0 - 100.0 fL   MCH 30.1 27.0 - 33.0 pg   MCHC 34.9 32.0 - 36.0 g/dL   RDW 13.1 11.0 - 15.0 %   Platelets 124 (L) 140 - 400 Thousand/uL   MPV 11.3 7.5 - 12.5 fL   Neutro Abs 4,281 1,500 - 7,800 cells/uL   Lymphs Abs 1,588 850 - 3,900 cells/uL   WBC mixed population 724 200 - 950 cells/uL   Eosinophils Absolute 74 15 - 500 cells/uL   Basophils Absolute 34 0 - 200 cells/uL   Neutrophils Relative % 63.9 %   Total Lymphocyte 23.7 %   Monocytes Relative 10.8 %   Eosinophils Relative 1.1 %   Basophils Relative 0.5 %   Smear Review      Comment: No platelet clumps seen. Review of peripheral smear confirms automated results.   Comp Met (CMET)     Status: Abnormal   Collection Time: 07/07/17  3:50 PM  Result Value Ref Range   Glucose, Bld 98 65 - 99 mg/dL    Comment: .            Fasting reference interval .    BUN 27 (H) 7 - 25 mg/dL   Creat 1.16 0.70 - 1.25 mg/dL    Comment: For patients >44 years of age, the reference limit for Creatinine is approximately 13% higher for people identified as African-American. .    BUN/Creatinine Ratio 23 (H) 6 - 22 (calc)   Sodium 138 135 - 146 mmol/L   Potassium 4.3 3.5 - 5.3 mmol/L   Chloride 109 98 - 110 mmol/L   CO2 21 20 - 32 mmol/L   Calcium 10.1 8.6 - 10.3 mg/dL   Total Protein 6.9 6.1 - 8.1 g/dL   Albumin 4.3 3.6 - 5.1 g/dL   Globulin 2.6 1.9 - 3.7 g/dL (calc)   AG Ratio 1.7 1.0 - 2.5 (calc)   Total Bilirubin 0.9 0.2 - 1.2 mg/dL   Alkaline phosphatase (APISO) 53 40 - 115 U/L   AST 16 10 - 35 U/L   ALT 14 9 - 46 U/L  Lipid panel     Status:  Abnormal     Collection Time: 07/07/17  3:50 PM  Result Value Ref Range   Cholesterol 198 <200 mg/dL   HDL 49 >40 mg/dL   Triglycerides 250 (H) <150 mg/dL   LDL Cholesterol (Calc) 113 (H) mg/dL (calc)    Comment: Reference range: <100 . Desirable range <100 mg/dL for primary prevention;   <70 mg/dL for patients with CHD or diabetic patients  with > or = 2 CHD risk factors. Marland Kitchen LDL-C is now calculated using the Martin-Hopkins  calculation, which is a validated novel method providing  better accuracy than the Friedewald equation in the  estimation of LDL-C.  Cresenciano Genre et al. Annamaria Helling. 5809;983(38): 2061-2068  (http://education.[facility].com/faq/FAQ164)    Total CHOL/HDL Ratio 4.0 <5.0 (calc)   Non-HDL Cholesterol (Calc) 149 (H) <130 mg/dL (calc)    Comment: For patients with diabetes plus 1 major ASCVD risk  factor, treating to a non-HDL-C goal of <100 mg/dL  (LDL-C of <70 mg/dL) is considered a therapeutic  option.   HgB A1c     Status: None   Collection Time: 07/07/17  3:50 PM  Result Value Ref Range   Hgb A1c MFr Bld 5.0 <5.7 % of total Hgb    Comment: For the purpose of screening for the presence of diabetes: . <5.7%       Consistent with the absence of diabetes 5.7-6.4%    Consistent with increased risk for diabetes             (prediabetes) > or =6.5%  Consistent with diabetes . This assay result is consistent with a decreased risk of diabetes. . Currently, no consensus exists regarding use of hemoglobin A1c for diagnosis of diabetes in children. . According to American Diabetes Association (ADA) guidelines, hemoglobin A1c <7.0% represents optimal control in non-pregnant diabetic patients. Different metrics may apply to specific patient populations.  Standards of Medical Care in Diabetes(ADA). .    Mean Plasma Glucose 97 (calc)   eAG (mmol/L) 5.4 (calc)  PSA     Status: None   Collection Time: 07/07/17  3:50 PM  Result Value Ref Range   PSA 1.2 < OR = 4.0 ng/mL     Comment: The total PSA value from this assay system is  standardized against the WHO standard. The test  result will be approximately 20% lower when compared  to the equimolar-standardized total PSA (Beckman  Coulter). Comparison of serial PSA results should be  interpreted with this fact in mind. . This test was performed using the Siemens  chemiluminescent method. Values obtained from  different assay methods cannot be used interchangeably. PSA levels, regardless of value, should not be interpreted as absolute evidence of the presence or absence of disease.     Patient was never admitted.   ROS: 14 pt review of systems performed and negative (unless mentioned in an HPI)  Objective: BP 140/60 (BP Location: Left Arm, Cuff Size: Normal)   Pulse 98   Temp 99.1 F (37.3 C) (Oral)   Ht 5' 5.8" (1.671 m)   Wt 228 lb (103.4 kg)   SpO2 96%   BMI 37.02 kg/m  Gen: Afebrile. No acute distress. Nontoxic in appearance, well-developed, well-nourished, Caucasian male. Obese. HENT: AT. [place]. Bilateral TM visualized and normal in appearance. MMM. Bilateral nares without erythema or bulging. Throat without erythema or exudates. No cough, no hoarseness. Eyes:Pupils Equal Round Reactive to light, Extraocular movements intact,  Conjunctiva without redness, discharge or icterus. Neck/lymp/endocrine: Supple, no lymphadenopathy, no thyromegaly CV: RRR  no murmur, no edema, +2/4 P posterior tibialis pulses Chest: CTAB, no wheeze or crackles Abd: Soft. Obese. NTND. BS present. No Masses palpated.  Skin: No rashes, purpura or petechiae. Intact. Warm , well-perfused. Neuro/msk:  Normal gait. PERLA. EOMi. Alert. Oriented x3. Cranial nerves II through XII intact.  DTRs equal bilaterally. Psych: Normal affect, dress and demeanor. Normal speech. Normal thought content and judgment.   Assessment/plan: Marselino Slayton is a 67 y.o. male present for CPE. Obesity (BMI 30-39.9) - exercise and dietary  modifications encouraged. Pt reports vague symptoms of shortness of breath without chest pain. Mostly when bending over to tie shoe.  Possibly secondary to weight gain. Encourage increasing exercise slowly to > 150 min a week. If not noticing improvement, or if worsening he is to be seen immediately. Monitor for red flags: chest pain, dizziness, syncope etc.  - Lipid panel - HgB A1c - TSH Elevated blood pressure reading Blood pressure reading today is borderline. Patient will monitor and outpatient setting and if routinely above 140/90 will call to make an appointment to be seen. - CBC w/Diff - Comp Met (CMET) - TSH Polyp of colon, unspecified part of colon, unspecified type Follow-up colonoscopy due 2021 Diabetes mellitus screening - HgB A1c Screening for deficiency anemia - CBC w/Diff Prostate cancer screening - PSA Encounter for preventive health examination Patient was encouraged to exercise greater than 150 minutes a week. Patient was encouraged to choose a diet filled with fresh fruits and vegetables, and lean meats. AVS provided to patient today for education/recommendation on gender specific health and safety maintenance. Colonoscopy: Completed 10/26/2016, by Dr. Fuller Plan. Multiple polyps, 3 year follow-up recommended. Immunizations:  tdap UTD 2017, influenza UTD 2018, Prevnar 13 07/03/2016 (PSV23 next year at 55) , shingrix printed today Infectious disease screening: HIV and Hep C negative. Former heavy smoker ( quit 2001)/fhx of AAA in father--> offer AAA screen at 38 (next year)  Return in about 6 months (around 01/04/2018) for CPE.  Electronically signed by: Howard Pouch, DO [place]

## 2017-07-07 NOTE — Patient Instructions (Addendum)
Your blood pressure is borderline. Increase your exercise > 150 minute a week. Lose of weight will help with your complaints today and improve your BP.  Monitor Blood pressure and if > 140/90 routinely please be seen to start medicine.  Low salt, high fiber diet.    Health Maintenance, Male A healthy lifestyle and preventive care is important for your health and wellness. Ask your health care provider about what schedule of regular examinations is right for you. What should I know about weight and diet? Eat a Healthy Diet  Eat plenty of vegetables, fruits, whole grains, low-fat dairy products, and lean protein.  Do not eat a lot of foods high in solid fats, added sugars, or salt.  Maintain a Healthy Weight Regular exercise can help you achieve or maintain a healthy weight. You should:  Do at least 150 minutes of exercise each week. The exercise should increase your heart rate and make you sweat (moderate-intensity exercise).  Do strength-training exercises at least twice a week.  Watch Your Levels of Cholesterol and Blood Lipids  Have your blood tested for lipids and cholesterol every 5 years starting at 65 years of age. If you are at high risk for heart disease, you should start having your blood tested when you are 65 years old. You may need to have your cholesterol levels checked more often if: ? Your lipid or cholesterol levels are high. ? You are older than 65 years of age. ? You are at high risk for heart disease.  What should I know about cancer screening? Many types of cancers can be detected early and may often be prevented. Lung Cancer  You should be screened every year for lung cancer if: ? You are a current smoker who has smoked for at least 30 years. ? You are a former smoker who has quit within the past 15 years.  Talk to your health care provider about your screening options, when you should start screening, and how often you should be screened.  Colorectal  Cancer  Routine colorectal cancer screening usually begins at 65 years of age and should be repeated every 5-10 years until you are 65 years old. You may need to be screened more often if early forms of precancerous polyps or small growths are found. Your health care provider may recommend screening at an earlier age if you have risk factors for colon cancer.  Your health care provider may recommend using home test kits to check for hidden blood in the stool.  A small camera at the end of a tube can be used to examine your colon (sigmoidoscopy or colonoscopy). This checks for the earliest forms of colorectal cancer.  Prostate and Testicular Cancer  Depending on your age and overall health, your health care provider may do certain tests to screen for prostate and testicular cancer.  Talk to your health care provider about any symptoms or concerns you have about testicular or prostate cancer.  Skin Cancer  Check your skin from head to toe regularly.  Tell your health care provider about any new moles or changes in moles, especially if: ? There is a change in a mole's size, shape, or color. ? You have a mole that is larger than a pencil eraser.  Always use sunscreen. Apply sunscreen liberally and repeat throughout the day.  Protect yourself by wearing long sleeves, pants, a wide-brimmed hat, and sunglasses when outside.  What should I know about heart disease, diabetes, and high blood pressure?  If you are 67-58 years of age, have your blood pressure checked every 3-5 years. If you are 21 years of age or older, have your blood pressure checked every year. You should have your blood pressure measured twice-once when you are at a hospital or clinic, and once when you are not at a hospital or clinic. Record the average of the two measurements. To check your blood pressure when you are not at a hospital or clinic, you can use: ? An automated blood pressure machine at a pharmacy. ? A home blood  pressure monitor.  Talk to your health care provider about your target blood pressure.  If you are between 13-53 years old, ask your health care provider if you should take aspirin to prevent heart disease.  Have regular diabetes screenings by checking your fasting blood sugar level. ? If you are at a normal weight and have a low risk for diabetes, have this test once every three years after the age of 53. ? If you are overweight and have a high risk for diabetes, consider being tested at a younger age or more often.  A one-time screening for abdominal aortic aneurysm (AAA) by ultrasound is recommended for men aged 36-75 years who are current or former smokers. What should I know about preventing infection? Hepatitis B If you have a higher risk for hepatitis B, you should be screened for this virus. Talk with your health care provider to find out if you are at risk for hepatitis B infection. Hepatitis C Blood testing is recommended for:  Everyone born from [birth year] through 1963.  Anyone with known risk factors for hepatitis C.  Sexually Transmitted Diseases (STDs)  You should be screened each year for STDs including gonorrhea and chlamydia if: ? You are sexually active and are younger than 65 years of age. ? You are older than 65 years of age and your health care provider tells you that you are at risk for this type of infection. ? Your sexual activity has changed since you were last screened and you are at an increased risk for chlamydia or gonorrhea. Ask your health care provider if you are at risk.  Talk with your health care provider about whether you are at high risk of being infected with HIV. Your health care provider may recommend a prescription medicine to help prevent HIV infection.  What else can I do?  Schedule regular health, dental, and eye exams.  Stay current with your vaccines (immunizations).  Do not use any tobacco products, such as cigarettes, chewing tobacco, and  e-cigarettes. If you need help quitting, ask your health care provider.  Limit alcohol intake to no more than 2 drinks per day. One drink equals 12 ounces of beer, 5 ounces of wine, or 1 ounces of hard liquor.  Do not use street drugs.  Do not share needles.  Ask your health care provider for help if you need support or information about quitting drugs.  Tell your health care provider if you often feel depressed.  Tell your health care provider if you have ever been abused or do not feel safe at home. This information is not intended to replace advice given to you by your health care provider. Make sure you discuss any questions you have with your health care provider. Document Released: 12/19/2007 Document Revised: 02/19/2016 Document Reviewed: 03/26/2015 Elsevier Interactive Patient Education  [employer].

## 2017-07-08 LAB — PSA: PSA: 1.2 ng/mL (ref <=4.0)

## 2017-07-08 LAB — COMPREHENSIVE METABOLIC PANEL
AG RATIO: 1.7 (calc) (ref 1.0–2.5)
ALT: 14 U/L (ref 9–46)
AST: 16 U/L (ref 10–35)
Albumin: 4.3 g/dL (ref 3.6–5.1)
Alkaline phosphatase (APISO): 53 U/L (ref 40–115)
BUN/Creatinine Ratio: 23 (calc) — ABNORMAL HIGH (ref 6–22)
BUN: 27 mg/dL — ABNORMAL HIGH (ref 7–25)
CO2: 21 mmol/L (ref 20–32)
CREATININE: 1.16 mg/dL (ref 0.70–1.25)
Calcium: 10.1 mg/dL (ref 8.6–10.3)
Chloride: 109 mmol/L (ref 98–110)
GLOBULIN: 2.6 g/dL (ref 1.9–3.7)
GLUCOSE: 98 mg/dL (ref 65–99)
Potassium: 4.3 mmol/L (ref 3.5–5.3)
SODIUM: 138 mmol/L (ref 135–146)
Total Bilirubin: 0.9 mg/dL (ref 0.2–1.2)
Total Protein: 6.9 g/dL (ref 6.1–8.1)

## 2017-07-08 LAB — TSH: TSH: 0.85 u[IU]/mL (ref 0.35–4.50)

## 2017-07-08 LAB — LIPID PANEL
Cholesterol: 198 mg/dL (ref <200)
HDL: 49 mg/dL (ref >40)
LDL CHOLESTEROL (CALC): 113 mg/dL — AB
Non-HDL Cholesterol (Calc): 149 mg/dL (calc) — ABNORMAL HIGH (ref <130)
TRIGLYCERIDES: 250 mg/dL — AB (ref <150)
Total CHOL/HDL Ratio: 4 (calc) (ref <5.0)

## 2017-07-08 LAB — CBC WITH DIFFERENTIAL/PLATELET
BASOS ABS: 34 {cells}/uL (ref 0–200)
BASOS PCT: 0.5 %
EOS ABS: 74 {cells}/uL (ref 15–500)
Eosinophils Relative: 1.1 %
HEMATOCRIT: 46.7 % (ref 38.5–50.0)
Hemoglobin: 16.3 g/dL (ref 13.2–17.1)
Lymphs Abs: 1588 cells/uL (ref 850–3900)
MCH: 30.1 pg (ref 27.0–33.0)
MCHC: 34.9 g/dL (ref 32.0–36.0)
MCV: 86.2 fL (ref 80.0–100.0)
MPV: 11.3 fL (ref 7.5–12.5)
Monocytes Relative: 10.8 %
NEUTROS ABS: 4281 {cells}/uL (ref 1500–7800)
Neutrophils Relative %: 63.9 %
Platelets: 124 10*3/uL — ABNORMAL LOW (ref 140–400)
RBC: 5.42 10*6/uL (ref 4.20–5.80)
RDW: 13.1 % (ref 11.0–15.0)
TOTAL LYMPHOCYTE: 23.7 %
WBC: 6.7 10*3/uL (ref 3.8–10.8)
WBCMIX: 724 {cells}/uL (ref 200–950)

## 2017-07-08 LAB — HEMOGLOBIN A1C
EAG (MMOL/L): 5.4 (calc)
Hgb A1c MFr Bld: 5 % of total Hgb (ref <5.7)
MEAN PLASMA GLUCOSE: 97 (calc)

## 2017-07-09 ENCOUNTER — Telehealth: Payer: Self-pay | Admitting: Family Medicine

## 2017-07-09 NOTE — Telephone Encounter (Signed)
Spoke with patient reviewed lab results and instructions. Patient verbalized understanding. Scheduled patient's follow up appt.

## 2017-07-09 NOTE — Telephone Encounter (Signed)
Please call patient: -His labs look good with the exception of his triglycerides, which is a portion of his cholesterol panel. His triglycerides have always been normal in the past. - Diet and exercise as we discussed would help lower his triglycerides. Eating more fiber will also help. I recommend he start a fish oil supplement 2000-3000 milligrams a day. I think he reported taking an over-the-counter fish oil, however dosage was not known. - Please remind him to monitor his blood pressure as we discussed and if elevated above 135/85 routinely then to make appointment to be seen for evaluation. - If his stomach and breathing concerns do not improve, or worsen with the start of dietary changes and exercise then I would like to see him concerning those matters. This is especially important given his family history of heart disease and his father's history of AAA. - Follow-up appointment with provider in 6 months to recheck cholesterol and follow-up on blood pressure. Please make sure he is aware to be fasting for that appointment.

## 2017-07-28 DIAGNOSIS — Z85828 Personal history of other malignant neoplasm of skin: Secondary | ICD-10-CM | POA: Diagnosis not present

## 2017-07-28 DIAGNOSIS — L821 Other seborrheic keratosis: Secondary | ICD-10-CM | POA: Diagnosis not present

## 2017-07-28 DIAGNOSIS — Z08 Encounter for follow-up examination after completed treatment for malignant neoplasm: Secondary | ICD-10-CM | POA: Diagnosis not present

## 2017-07-28 DIAGNOSIS — D0439 Carcinoma in situ of skin of other parts of face: Secondary | ICD-10-CM | POA: Diagnosis not present

## 2017-07-28 DIAGNOSIS — L570 Actinic keratosis: Secondary | ICD-10-CM | POA: Diagnosis not present

## 2017-09-17 ENCOUNTER — Encounter (HOSPITAL_BASED_OUTPATIENT_CLINIC_OR_DEPARTMENT_OTHER): Payer: Self-pay | Admitting: Emergency Medicine

## 2017-09-17 ENCOUNTER — Other Ambulatory Visit: Payer: Self-pay

## 2017-09-17 ENCOUNTER — Emergency Department (HOSPITAL_BASED_OUTPATIENT_CLINIC_OR_DEPARTMENT_OTHER)
Admission: EM | Admit: 2017-09-17 | Discharge: 2017-09-17 | Disposition: A | Discharge status: Home / Self Care | Payer: Worker's Compensation | Attending: Emergency Medicine | Admitting: Emergency Medicine

## 2017-09-17 DIAGNOSIS — Z79899 Other long term (current) drug therapy: Secondary | ICD-10-CM | POA: Insufficient documentation

## 2017-09-17 DIAGNOSIS — S61210A Laceration without foreign body of right index finger without damage to nail, initial encounter: Secondary | ICD-10-CM | POA: Insufficient documentation

## 2017-09-17 DIAGNOSIS — Y990 Civilian activity done for income or pay: Secondary | ICD-10-CM | POA: Diagnosis not present

## 2017-09-17 DIAGNOSIS — Y9389 Activity, other specified: Secondary | ICD-10-CM | POA: Diagnosis not present

## 2017-09-17 DIAGNOSIS — Y929 Unspecified place or not applicable: Secondary | ICD-10-CM | POA: Diagnosis not present

## 2017-09-17 DIAGNOSIS — Z7982 Long term (current) use of aspirin: Secondary | ICD-10-CM | POA: Diagnosis not present

## 2017-09-17 DIAGNOSIS — Z87891 Personal history of nicotine dependence: Secondary | ICD-10-CM | POA: Diagnosis not present

## 2017-09-17 DIAGNOSIS — W230XXA Caught, crushed, jammed, or pinched between moving objects, initial encounter: Secondary | ICD-10-CM | POA: Insufficient documentation

## 2017-09-17 DIAGNOSIS — S6991XA Unspecified injury of right wrist, hand and finger(s), initial encounter: Secondary | ICD-10-CM | POA: Diagnosis present

## 2017-09-17 MED ORDER — TETANUS-DIPHTH-ACELL PERTUSSIS 5-2.5-18.5 LF-MCG/0.5 IM SUSP
0.5000 mL | Freq: Once | INTRAMUSCULAR | Status: DC
  Filled 2017-09-17: qty 0.5

## 2017-09-17 MED ORDER — LIDOCAINE HCL 2 % IJ SOLN
10.0000 mL | Freq: Once | INTRAMUSCULAR | Status: AC
  Administered 2017-09-17: 200 mg
  Filled 2017-09-17: qty 20

## 2017-09-17 MED ORDER — CEPHALEXIN 500 MG PO CAPS: 500.0000 mg | ORAL_CAPSULE | Freq: Four times a day (QID) | ORAL | 0 refills | Status: DC

## 2017-09-17 MED ORDER — CEPHALEXIN 250 MG PO CAPS
500.0000 mg | ORAL_CAPSULE | Freq: Once | ORAL | Status: AC
  Administered 2017-09-17: 500 mg via ORAL
  Filled 2017-09-17: qty 2

## 2017-09-17 MED FILL — CEPHALEXIN 500 MG CAPSULE: 500 | 5 days supply | Qty: 20 | Fill #0

## 2017-09-17 NOTE — ED Notes (Signed)
ED Provider at bedside. 

## 2017-09-17 NOTE — Discharge Instructions (Signed)
Keep your wound covered for 24 hours. After this time, change your dressing daily and clean with warm water and mild soap. Do not apply peroxide to the area as this could prolong healing. Take Keflex as prescribed to prevent infection. You may return to the ED for worsening symptoms or if signs of infection develop. You stitches do not need to be removed as they will dissolve on their own.

## 2017-09-17 NOTE — ED Triage Notes (Signed)
Patient states that he went to the urgent care and was seen for a finger laceration to his right hand. They sent him here for further evaluation. He reports that he had his work UDS at the [facility] - the patient finger is wrapped.

## 2017-09-17 NOTE — ED Provider Notes (Signed)
[facility] EMERGENCY DEPARTMENT Provider Note   CSN: [id number] Arrival date & time: 09/17/17  1204     History   Chief Complaint Chief Complaint  Patient presents with  . Finger Injury    HPI Paul Phillips is a 67 y.o. male.  67 year old male presents to the emergency department for evaluation of a laceration to the index finger of his right hand.  He was sent from [facility]. for additional evaluation.  He reports that the injury occurred while he was at work.  He was using different machinery and a piece of machinery caught his finger causing his laceration.  He has no associated decreased range of motion, numbness, paresthesias.  Bleeding controlled with pressure prior to arrival.  Mild discomfort at laceration site aggravated by palpation.  Last Tdap was 2 years ago.      Past Medical History:  Diagnosis Date  . Arthritis   . Dislocated thumb   . GERD (gastroesophageal reflux disease)   . Heart murmur     Patient Active Problem List   Diagnosis Date Noted  . Hypertriglyceridemia 07/07/2017  . Colon polyps 11/16/2016  . Encounter for preventive health examination 07/03/2016  . Obesity (BMI 30-39.9) 06/19/2016  . Elevated blood pressure reading 06/19/2016  . Former heavy tobacco smoker 06/19/2016    Past Surgical History:  Procedure Laterality Date  . NO PAST SURGERIES         Home Medications    Prior to Admission medications   Medication Sig Start Date End Date Taking? Authorizing Provider  acetaminophen (TYLENOL) 325 MG tablet Take 650 mg by mouth every 8 (eight) hours.    [provider]  aspirin EC 81 MG tablet Take 81 mg by mouth daily.    [provider]  Biotin 10 MG TABS Take by mouth.    [provider]  cephALEXin (KEFLEX) 500 MG capsule Take 1 capsule (500 mg total) by mouth 4 (four) times daily. 09/17/17   Antonietta Breach, PA-C  ranitidine (ZANTAC) 150 MG capsule Take 150 mg by mouth 2 (two)  times daily.    [provider]  vitamin B-12 (CYANOCOBALAMIN) 1000 MCG tablet Take 1,000 mcg by mouth daily.    [provider]    Family History Family History  Problem Relation Age of Onset  . Arthritis Mother   . Diabetes Mother   . Hearing loss Mother   . Stroke Mother   . Hearing loss Father   . Heart disease Father   . AAA (abdominal aortic aneurysm) Father   . Aneurysm Sister 58       brain, died from aneurysm suddenly.   [name] attack Brother   . Arthritis/Rheumatoid Maternal Grandmother   . Brain cancer Maternal Grandfather   . Colon cancer Neg Hx     Social History Social History   Tobacco Use  . Smoking status: Former Smoker    Packs/day: 1.00    Years: 30.00    Pack years: 30.00    Types: Cigarettes    Last attempt to quit: 07/07/1999    Years since quitting: 18.2  . Smokeless tobacco: Never Used  Substance Use Topics  . Alcohol use: Yes    Alcohol/week: 6.0 oz    Types: 10 Cans of beer per week  . Drug use: No     Allergies   Patient has no known allergies.   Review of Systems Review of Systems Ten systems reviewed and are negative for  acute change, except as noted in the HPI.    Physical Exam Updated Vital Signs BP 137/64 (BP Location: Left Arm)   Pulse 90   Temp 99 F (37.2 C) (Oral)   Resp 18   Ht 5\' 7"  (1.702 m)   Wt 99.8 kg (220 lb)   SpO2 100%   BMI 34.46 kg/m   Physical Exam  Constitutional: He is oriented to person, place, and time. He appears well-developed and well-nourished. No distress.  Nontoxic appearing and in NAD  HENT:  Head: Normocephalic and atraumatic.  Eyes: Conjunctivae and EOM are normal. No scleral icterus.  Neck: Normal range of motion.  Cardiovascular: Normal rate, regular rhythm and intact distal pulses.  Capillary refill brisk at the distal tip of the affected digit.  Pulmonary/Chest: Effort normal. No stridor. No respiratory distress.  Musculoskeletal: Normal range of motion.        Right hand: He exhibits laceration. He exhibits normal range of motion, no bony tenderness and no swelling. Normal sensation noted.       Hands: Laceration to the fat pad of the R index finger; approx 1cm. Bleeding controlled. Normal ROM the affected digit.  Neurological: He is alert and oriented to person, place, and time. He exhibits normal muscle tone. Coordination normal.  Sensation to light touch intact in all digits of the R hand.  Skin: Skin is warm and dry. No rash noted. He is not diaphoretic. No erythema. No pallor.  Psychiatric: He has a normal mood and affect. His behavior is normal.  Nursing note and vitals reviewed.    ED Treatments / Results  Labs (all labs ordered are listed, but only abnormal results are displayed) Labs Reviewed - No data to display  EKG  EKG Interpretation None       Radiology No results found.  Procedures Procedures (including critical care time)  LACERATION REPAIR Performed by: Antonietta Breach Authorized by: Antonietta Breach Consent: Verbal consent obtained. Risks and benefits: risks, benefits and alternatives were discussed Consent given by: patient Patient identity confirmed: provided demographic data Prepped and Draped in normal sterile fashion Wound explored  Laceration Location: R index finger   Laceration Length: 1cm  No Foreign Bodies seen or palpated  Anesthesia: digital block  Local anesthetic: lidocaine 2% without epinephrine  Anesthetic total: 5 ml  Irrigation method: syringe Amount of cleaning: standard  Skin closure: 5-0 chromic  Number of sutures: 4  Technique: simple interrupted  Patient tolerance: Patient tolerated the procedure well with no immediate complications.   Medications Ordered in ED Medications  lidocaine (XYLOCAINE) 2 % (with pres) injection 200 mg (200 mg Infiltration Given 09/17/17 1239)  cephALEXin (KEFLEX) capsule 500 mg (500 mg Oral Given 09/17/17 1323)     Initial Impression / Assessment  and Plan / ED Course  I have reviewed the triage vital signs and the nursing notes.  Pertinent labs & imaging results that were available during my care of the patient were reviewed by me and considered in my medical decision making (see chart for details).     Tdap booster UTD. Patient neurovascularly intact. Laceration occurred < 8 hours prior to repair which was well tolerated. Pt has no comorbidities to effect normal wound healing. Discussed sutured wound care with patient and answered questions. Pt to follow up for wound recheck in 1 week with is PCP. Pt is hemodynamically stable with no complaints prior to discharge.    Final Clinical Impressions(s) / ED Diagnoses   Final diagnoses:  Laceration  of right index finger without foreign body without damage to nail, initial encounter    ED Discharge Orders        Ordered    cephALEXin (KEFLEX) 500 MG capsule  4 times daily     09/17/17 1345       Antonietta Breach, PA-C 09/17/17 1348    Hayden Rasmussen, MD 09/18/17 [phone]

## 2017-09-20 ENCOUNTER — Ambulatory Visit: Payer: Self-pay

## 2017-09-20 ENCOUNTER — Encounter: Payer: Self-pay | Admitting: Family Medicine

## 2017-09-20 ENCOUNTER — Ambulatory Visit: Payer: BLUE CROSS/BLUE SHIELD | Admitting: Family Medicine

## 2017-09-20 VITALS — BP 138/65 | HR 89 | Temp 98.8°F | Resp 20 | Ht 67.0 in | Wt 224.0 lb

## 2017-09-20 DIAGNOSIS — H1131 Conjunctival hemorrhage, right eye: Secondary | ICD-10-CM

## 2017-09-20 MED ORDER — ERYTHROMYCIN 5 MG/GM OP OINT: 1.0000 "application " | TOPICAL_OINTMENT | Freq: Every day | OPHTHALMIC | 0 refills | Status: DC

## 2017-09-20 NOTE — Telephone Encounter (Signed)
Patient called, left VM to return call to the office to discuss the red, swollen eye.

## 2017-09-20 NOTE — Telephone Encounter (Addendum)
Pt called with complaints of the sclera of his right eye is red; this has been worsening since Thursday 09/16/17; pt states that he is at work and can not remove contact lenses;  recommendations made per nurse protocol to include seeing a physician within 3 days; pt offered and accepted appointment with Dr Raoul Pitch at [facility] today at 1515; pt verbalizes understanding; will route to [place] for notification of upcoming appointment.  Answer Assessment - Initial Assessment Questions 1. LOCATION: Location: "What's red, the eyeball or the outer eyelids?" (Note: when callers say the eye is red, they usually mean the sclera is red)       Right eyeball in corner next to nose 2. REDNESS OF SCLERA: "Is the redness in one or both eyes?" "When did the redness start?"      Thursday 09/16/17 3. ONSET: "When did the eye become red?" (e.g., hours, days)      days 4. EYELIDS: "Are the eyelids red or swollen?" If so, ask: "How much?"      no 5. VISION: "Is there any difficulty seeing clearly?"      yes 6. ITCHING: "Does it feel itchy?" If so ask: "How bad is it" (e.g., Scale 1-10; or mild, moderate, severe)     no 7. PAIN: "Is there any pain? If so, ask: "How bad is it?" (e.g., Scale 1-10; or mild, moderate, severe)     no 8. CONTACT LENS: "Do you wear contacts?"     yes 9. CAUSE: "What do you think is causing the redness?"     unknown 10. OTHER SYMPTOMS: "Do you have any other symptoms?" (e.g., fever, runny nose, cough, vomiting)        no  Protocols used: EYE - RED WITHOUT PUS-A-AH

## 2017-09-20 NOTE — Progress Notes (Signed)
Paul Phillips, Paul Phillips June 08, 1951, 67 y.o., male MRN: [id number] Patient Care Team    Relationship Specialty Notifications Start End  Ma Hillock, DO PCP - General Family Medicine  06/19/16     Chief Complaint  Patient presents with  . Eye Problem     right     Subjective: Pt presents for an OV with complaints of right thigh redness of 4 days duration.  Associated symptoms include nothing.  Patient denies drainage, visual changes or tenderness.  States if he had not seen it in the mirror he would not even though his eye was red.  Denies any current/recent injury.  He does admit to something flying into his eye approximately 1-1/2 months ago.  He recently sustained an injury to his finger, he states that the eye symptoms occurred the day before so it is not contributory.  Patient did contact lens wearer.  He Does not have an ophthalmologist.  Depression screen [facility] 2/9 07/07/2017 07/03/2016 06/19/2016  Decreased Interest 0 0 0  Down, Depressed, Hopeless 0 0 0  PHQ - 2 Score 0 0 0   No Known Allergies Social History   Tobacco Use  . Smoking status: Former Smoker    Packs/day: 1.00    Years: 30.00    Pack years: 30.00    Types: Cigarettes    Last attempt to quit: 07/07/1999    Years since quitting: 18.2  . Smokeless tobacco: Never Used  Substance Use Topics  . Alcohol use: Yes    Alcohol/week: 6.0 oz    Types: 10 Cans of beer per week   Past Medical History:  Diagnosis Date  . Arthritis   . Dislocated thumb   . GERD (gastroesophageal reflux disease)   . Heart murmur    Past Surgical History:  Procedure Laterality Date  . NO PAST SURGERIES     Family History  Problem Relation Age of Onset  . Arthritis Mother   . Diabetes Mother   . Hearing loss Mother   . Stroke Mother   . Hearing loss Father   . Heart disease Father   . AAA (abdominal aortic aneurysm) Father   . Aneurysm Sister 58       brain, died from aneurysm suddenly.   [name] attack Brother   .  Arthritis/Rheumatoid Maternal Grandmother   . Brain cancer Maternal Grandfather   . Colon cancer Neg Hx    Allergies as of 09/20/2017   No Known Allergies     Medication List        Accurate as of 09/20/17  3:21 PM. Always use your most recent med list.          acetaminophen 325 MG tablet Commonly known as:  TYLENOL Take 650 mg by mouth every 8 (eight) hours.   aspirin EC 81 MG tablet Take 81 mg by mouth daily.   Biotin 10 MG Tabs Take by mouth.   cephALEXin 500 MG capsule Commonly known as:  KEFLEX Take 1 capsule (500 mg total) by mouth 4 (four) times daily.   ranitidine 150 MG capsule Commonly known as:  ZANTAC Take 150 mg by mouth 2 (two) times daily.   vitamin B-12 1000 MCG tablet Commonly known as:  CYANOCOBALAMIN Take 1,000 mcg by mouth daily.       All past medical history, surgical history, allergies, family history, immunizations andmedications were updated in the EMR today and reviewed under the history and medication portions of their EMR.  ROS: Negative, with the exception of above mentioned in HPI   Objective:  BP 138/65 (BP Location: Left Arm, Patient Position: Sitting, Cuff Size: Large)   Pulse 89   Temp 98.8 F (37.1 C)   Resp 20   Ht 5\' 7"  (1.702 m)   Wt 224 lb (101.6 kg)   SpO2 97%   BMI 35.08 kg/m  Body mass index is 35.08 kg/m. Gen: Afebrile. No acute distress. Nontoxic in appearance, well developed, well nourished.  HENT: AT. [place].MMM, no oral lesions.  Eyes:Pupils Equal Round Reactive to light, Extraocular movements intact,  Conjunctiva with medial subconjunctival l hemorrhage left eye.  2 small punctate areas conjunctival.  Possible small ulceration.  No discharge or icterus.  No tenderness.  No sensitivity to light.   No exam data present No results found. No results found for this or any previous visit (from the past 24 hour(s)).  Assessment/Plan: Paul Phillips is a 67 y.o. male present for OV for    Subconjunctival hemorrhage of right eye -Concern for eye injury.  Patient reports foreign body of the eye approximately 1 month ago.  On exam possible small area of ulceration along with 2 punctate areas surrounding subconjunctival hemorrhage. -Considered possible blood pressure as a cause, however patient's blood pressures have always been normal to borderline. -Erythromycin ophthalmic ointment provided nightly. - Avoid contact use until evaluated by ophthalmology. - Ambulatory referral to Ophthalmology -Follow-up as needed   Reviewed expectations re: course of current medical issues.  Discussed self-management of symptoms.  Outlined signs and symptoms indicating need for more acute intervention.  Patient verbalized understanding and all questions were answered.  Patient received an After-Visit Summary.    No orders of the defined types were placed in this encounter.    Note is dictated utilizing voice recognition software. Although note has been proof read prior to signing, occasional typographical errors still can be missed. If any questions arise, please do not hesitate to call for verification.   electronically signed by:  Howard Pouch, DO  [place]

## 2017-09-20 NOTE — Telephone Encounter (Signed)
  Reason for Disposition . [1] Only 1 eye is red AND [2] present > 48 hours  Protocols used: EYE - RED WITHOUT PUS-A-AH

## 2017-09-20 NOTE — Patient Instructions (Signed)
Place ointment in eye before bed.  Avoid contacts.  They will call you to set up eye doctor appt to have magnified exam.    Subconjunctival Hemorrhage Subconjunctival hemorrhage is bleeding that happens between the white part of your eye (sclera) and the clear membrane that covers the outside of your eye (conjunctiva). There are many tiny blood vessels near the surface of your eye. A subconjunctival hemorrhage happens when one or more of these vessels breaks and bleeds, causing a red patch to appear on your eye. This is similar to a bruise. Depending on the amount of bleeding, the red patch may only cover a small area of your eye or it may cover the entire visible part of the sclera. If a lot of blood collects under the conjunctiva, there may also be swelling. Subconjunctival hemorrhages do not affect your vision or cause pain, but your eye may feel irritated if there is swelling. Subconjunctival hemorrhages usually do not require treatment, and they disappear on their own within two weeks. What are the causes? This condition may be caused by:  Mild trauma, such as rubbing your eye too hard.  Severe trauma or blunt injuries.  Coughing, sneezing, or vomiting.  Straining, such as when lifting a heavy object.  High blood pressure.  Recent eye surgery.  A history of diabetes.  Certain medicines, especially blood thinners (anticoagulants).  Other conditions, such as eye tumors, bleeding disorders, or blood vessel abnormalities.  Subconjunctival hemorrhages can happen without an obvious cause. What are the signs or symptoms? Symptoms of this condition include:  A bright red or dark red patch on the white part of the eye. ? The red area may spread out to cover a larger area of the eye before it goes away. ? The red area may turn brownish-yellow before it goes away.  Swelling.  Mild eye irritation.  How is this diagnosed? This condition is diagnosed with a physical exam. If your  subconjunctival hemorrhage was caused by trauma, your health care provider may refer you to an eye specialist (ophthalmologist) or another specialist to check for other injuries. You may have other tests, including:  An eye exam.  A blood pressure check.  Blood tests to check for bleeding disorders.  If your subconjunctival hemorrhage was caused by trauma, X-rays or a CT scan may be done to check for other injuries. How is this treated? Usually, no treatment is needed. Your health care provider may recommend eye drops or cold compresses to help with discomfort. Follow these instructions at home:  Take over-the-counter and prescription medicines only as directed by your health care provider.  Use eye drops or cold compresses to help with discomfort as directed by your health care provider.  Avoid activities, things, and environments that may irritate or injure your eye.  Keep all follow-up visits as told by your health care provider. This is important. Contact a health care provider if:  You have pain in your eye.  The bleeding does not go away within 3 weeks.  You keep getting new subconjunctival hemorrhages. Get help right away if:  Your vision changes or you have difficulty seeing.  You suddenly develop severe sensitivity to light.  You develop a severe headache, persistent vomiting, confusion, or abnormal tiredness (lethargy).  Your eye seems to bulge or protrude from your eye socket.  You develop unexplained bruises on your body.  You have unexplained bleeding in another area of your body. This information is not intended to replace advice given  to you by your health care provider. Make sure you discuss any questions you have with your health care provider. Document Released: 06/22/2005 Document Revised: 02/16/2016 Document Reviewed: 08/29/2014 Elsevier Interactive Patient Education  [employer].

## 2017-09-22 DIAGNOSIS — H1131 Conjunctival hemorrhage, right eye: Secondary | ICD-10-CM | POA: Diagnosis not present

## 2017-09-22 DIAGNOSIS — H25813 Combined forms of age-related cataract, bilateral: Secondary | ICD-10-CM | POA: Diagnosis not present

## 2017-09-22 DIAGNOSIS — H02834 Dermatochalasis of left upper eyelid: Secondary | ICD-10-CM | POA: Diagnosis not present

## 2017-09-22 DIAGNOSIS — H02831 Dermatochalasis of right upper eyelid: Secondary | ICD-10-CM | POA: Diagnosis not present

## 2017-09-23 ENCOUNTER — Encounter: Payer: Self-pay | Admitting: Family Medicine

## 2017-10-18 DIAGNOSIS — L570 Actinic keratosis: Secondary | ICD-10-CM | POA: Diagnosis not present

## 2017-12-06 DIAGNOSIS — L820 Inflamed seborrheic keratosis: Secondary | ICD-10-CM | POA: Diagnosis not present

## 2017-12-06 DIAGNOSIS — Z85828 Personal history of other malignant neoplasm of skin: Secondary | ICD-10-CM | POA: Diagnosis not present

## 2017-12-06 DIAGNOSIS — Z08 Encounter for follow-up examination after completed treatment for malignant neoplasm: Secondary | ICD-10-CM | POA: Diagnosis not present

## 2017-12-10 ENCOUNTER — Encounter: Payer: Self-pay | Admitting: Family Medicine

## 2017-12-10 ENCOUNTER — Ambulatory Visit: Payer: BLUE CROSS/BLUE SHIELD | Admitting: Family Medicine

## 2017-12-10 VITALS — BP 162/73 | HR 82 | Resp 16 | Ht 67.0 in | Wt 212.0 lb

## 2017-12-10 DIAGNOSIS — D696 Thrombocytopenia, unspecified: Secondary | ICD-10-CM | POA: Insufficient documentation

## 2017-12-10 DIAGNOSIS — I10 Essential (primary) hypertension: Secondary | ICD-10-CM

## 2017-12-10 DIAGNOSIS — Z8249 Family history of ischemic heart disease and other diseases of the circulatory system: Secondary | ICD-10-CM | POA: Diagnosis not present

## 2017-12-10 DIAGNOSIS — Z87891 Personal history of nicotine dependence: Secondary | ICD-10-CM | POA: Insufficient documentation

## 2017-12-10 DIAGNOSIS — E781 Pure hyperglyceridemia: Secondary | ICD-10-CM | POA: Diagnosis not present

## 2017-12-10 MED ORDER — LISINOPRIL 10 MG PO TABS: 10.0000 mg | ORAL_TABLET | Freq: Every day | ORAL | 0 refills | Status: DC

## 2017-12-10 NOTE — Progress Notes (Signed)
Paul Phillips, Paul Phillips 07/07/1950, 67 y.o., male MRN: [id number] Patient Care Team    Relationship Specialty Notifications Start End  Ma Hillock, DO PCP - General Family Medicine  06/19/16     Chief Complaint  Patient presents with  . Hypertension     Subjective:   HTN/HLD/Morbid obesity/thrombocytopenia: Blood pressures ranges at home not routinely checked, but elevated above normal when they are.. Patient denies chest pain, shortness of breath or lower extremity edema. Pt takes a daily baby ASA. Pt is not prescribed statin.  He is taking fish oil supplementation since his last visit. BMP: 07/07/2017 creatinine 1.16 CBC: 07/07/2017, normal limits with exception of chronic thrombocytopenia with platelets 124 Lipids: 07/07/2017 total cholesterol 198, HDL 49, LDL 113, triglycerides 250. TSH: 07/07/2017 within normal limits Diet: Started watching his diet very closely. Exercise: Is exercising routinely.  He has lost 12 pounds. RF: Hypertension, hypertriglyceridemia, former smoker, strong family history of heart disease, stroke and aneurysms  AAA screen:  FHX AAA in father. Brain aneurysm in his sister.  Former smoker.  67 year old male.  Shared decision making appointment completed concerning AAA screening once he turns 76 which will be at the end of August.  Patient is asymptomatic.  Depression screen [facility] 2/9 12/10/2017 07/07/2017 07/03/2016 06/19/2016  Decreased Interest 0 0 0 0  Down, Depressed, Hopeless - 0 0 0  PHQ - 2 Score 0 0 0 0    No Known Allergies Social History   Tobacco Use  . Smoking status: Former Smoker    Packs/day: 1.00    Years: 30.00    Pack years: 30.00    Types: Cigarettes    Last attempt to quit: 07/07/1999    Years since quitting: 18.4  . Smokeless tobacco: Never Used  Substance Use Topics  . Alcohol use: Yes    Alcohol/week: 6.0 oz    Types: 10 Cans of beer per week   Past Medical History:  Diagnosis Date  . Arthritis   . Dislocated thumb   .  GERD (gastroesophageal reflux disease)   . Heart murmur    Past Surgical History:  Procedure Laterality Date  . NO PAST SURGERIES     Family History  Problem Relation Age of Onset  . Arthritis Mother   . Diabetes Mother   . Hearing loss Mother   . Stroke Mother   . Hearing loss Father   . Heart disease Father   . AAA (abdominal aortic aneurysm) Father   . Aneurysm Sister 64       brain, died from aneurysm suddenly.   [name] attack Brother   . Arthritis/Rheumatoid Maternal Grandmother   . Brain cancer Maternal Grandfather   . Colon cancer Neg Hx    Allergies as of 12/10/2017   No Known Allergies     Medication List        Accurate as of 12/10/17 11:59 PM. Always use your most recent med list.          aspirin EC 81 MG tablet Take 81 mg by mouth daily.   Biotin 10 MG Tabs Take by mouth.   lisinopril 10 MG tablet Commonly known as:  PRINIVIL,ZESTRIL Take 1 tablet (10 mg total) by mouth daily.   ranitidine 150 MG capsule Commonly known as:  ZANTAC Take 150 mg by mouth 2 (two) times daily.   vitamin B-12 1000 MCG tablet Commonly known as:  CYANOCOBALAMIN Take 1,000 mcg by mouth daily.  All past medical history, surgical history, allergies, family history, immunizations andmedications were updated in the EMR today and reviewed under the history and medication portions of their EMR.     ROS: Negative, with the exception of above mentioned in HPI   Objective:  BP (!) 162/73   Pulse 82   Resp 16   Ht 5\' 7"  (1.702 m)   Wt 212 lb (96.2 kg)   SpO2 96%   BMI 33.20 kg/m  Body mass index is 33.2 kg/m. Gen: Afebrile. No acute distress. Nontoxic in appearance, well developed, well nourished.  HENT: AT. [place]. Marland Kitchen MMM Eyes:Pupils Equal Round Reactive to light, Extraocular movements intact,  Conjunctiva without redness, discharge or icterus. Neck/lymp/endocrine: Supple,no lymphadenopathy CV: RRR no murmur, no edema, no carotid bruit  Chest: CTAB, no wheeze or  crackles. .  Abd: Soft. NTND. BS present. no Masses palpated.  Skin: no rashes, purpura or petechiae.  Neuro:  Normal gait. PERLA. EOMi. Alert. Oriented x3   No exam data present No results found. No results found for this or any previous visit (from the past 24 hour(s)).  Assessment/Plan: Paul Phillips is a 67 y.o. male present for OV for   Hypertriglyceridemia/Morbid obesity (HCC)/Essential hypertension Declined statin, taking fish oil. Lost 12 lbs. Would benefit from statin if normal give fhx.  - Lipid panel -continue diet and exercise, doing great.  Lost 12 pounds. -Start lisinopril 10 mg daily -Aspirin continue -Consider statin for CV protection given family history, patient had declined in the past -Follow-up 2 weeks on recheck after starting lisinopril. Former smoker/Family history of abdominal aortic aneurysm (AAA) - Shared decision-making with patient completed today.  He is agreeable to AAA screening in September when he will be 67 years old. Asymptomatic.  - US ABDOMINAL AORTA SCREENING AAA; Future Thrombocytopenia (HCC) - Chronic. Stable. Continue to monitor yearly.    Reviewed expectations re: course of current medical issues.  Discussed self-management of symptoms.  Outlined signs and symptoms indicating need for more acute intervention.  Patient verbalized understanding and all questions were answered.  Patient received an After-Visit Summary.    Orders Placed This Encounter  Procedures  . US ABDOMINAL AORTA SCREENING AAA  . Lipid panel     Note is dictated utilizing voice recognition software. Although note has been proof read prior to signing, occasional typographical errors still can be missed. If any questions arise, please do not hesitate to call for verification.   electronically signed by:  Howard Pouch, DO  [place]

## 2017-12-10 NOTE — Patient Instructions (Addendum)
Good job on weight loss.  Continue fish oil supplement.  If still elevated today will discuss medications.   Start lisinopril 1 tab daily.  Follow up in 2 weeks.     Hypertension Hypertension is another name for high blood pressure. High blood pressure forces your heart to work harder to pump blood. This can cause problems over time. There are two numbers in a blood pressure reading. There is a top number (systolic) over a bottom number (diastolic). It is best to have a blood pressure below 120/80. Healthy choices can help lower your blood pressure. You may need medicine to help lower your blood pressure if:  Your blood pressure cannot be lowered with healthy choices.  Your blood pressure is higher than 130/80.  Follow these instructions at home: Eating and drinking  If directed, follow the DASH eating plan. This diet includes: ? Filling half of your plate at each meal with fruits and vegetables. ? Filling one quarter of your plate at each meal with whole grains. Whole grains include whole wheat pasta, brown rice, and whole grain bread. ? Eating or drinking low-fat dairy products, such as skim milk or low-fat yogurt. ? Filling one quarter of your plate at each meal with low-fat (lean) proteins. Low-fat proteins include fish, skinless chicken, eggs, beans, and tofu. ? Avoiding fatty meat, cured and processed meat, or chicken with skin. ? Avoiding premade or processed food.  Eat less than 1,500 mg of salt (sodium) a day.  Limit alcohol use to no more than 1 drink a day for nonpregnant women and 2 drinks a day for men. One drink equals 12 oz of beer, 5 oz of wine, or 1 oz of hard liquor. Lifestyle  Work with your doctor to stay at a healthy weight or to lose weight. Ask your doctor what the best weight is for you.  Get at least 30 minutes of exercise that causes your heart to beat faster (aerobic exercise) most days of the week. This may include walking, swimming, or biking.  Get at  least 30 minutes of exercise that strengthens your muscles (resistance exercise) at least 3 days a week. This may include lifting weights or pilates.  Do not use any products that contain nicotine or tobacco. This includes cigarettes and e-cigarettes. If you need help quitting, ask your doctor.  Check your blood pressure at home as told by your doctor.  Keep all follow-up visits as told by your doctor. This is important. Medicines  Take over-the-counter and prescription medicines only as told by your doctor. Follow directions carefully.  Do not skip doses of blood pressure medicine. The medicine does not work as well if you skip doses. Skipping doses also puts you at risk for problems.  Ask your doctor about side effects or reactions to medicines that you should watch for. Contact a doctor if:  You think you are having a reaction to the medicine you are taking.  You have headaches that keep coming back (recurring).  You feel dizzy.  You have swelling in your ankles.  You have trouble with your vision. Get help right away if:  You get a very bad headache.  You start to feel confused.  You feel weak or numb.  You feel faint.  You get very bad pain in your: ? Chest. ? Belly (abdomen).  You throw up (vomit) more than once.  You have trouble breathing. Summary  Hypertension is another name for high blood pressure.  Making healthy choices can  help lower blood pressure. If your blood pressure cannot be controlled with healthy choices, you may need to take medicine. This information is not intended to replace advice given to you by your health care provider. Make sure you discuss any questions you have with your health care provider. Document Released: 12/09/2007 Document Revised: 05/20/2016 Document Reviewed: 05/20/2016 Elsevier Interactive Patient Education  [employer].

## 2017-12-11 LAB — LIPID PANEL
CHOL/HDL RATIO: 3.3 (calc) (ref <5.0)
Cholesterol: 167 mg/dL (ref <200)
HDL: 51 mg/dL (ref >40)
LDL CHOLESTEROL (CALC): 97 mg/dL
Non-HDL Cholesterol (Calc): 116 mg/dL (calc) (ref <130)
TRIGLYCERIDES: 92 mg/dL (ref <150)

## 2017-12-13 ENCOUNTER — Encounter: Payer: Self-pay | Admitting: Family Medicine

## 2017-12-24 ENCOUNTER — Ambulatory Visit: Payer: BLUE CROSS/BLUE SHIELD | Admitting: Family Medicine

## 2017-12-27 ENCOUNTER — Encounter: Payer: Self-pay | Admitting: Family Medicine

## 2017-12-27 ENCOUNTER — Ambulatory Visit: Payer: BLUE CROSS/BLUE SHIELD | Admitting: Family Medicine

## 2017-12-27 VITALS — BP 128/54 | HR 68 | Temp 98.4°F | Resp 20 | Ht 67.0 in | Wt 221.0 lb

## 2017-12-27 DIAGNOSIS — Z87891 Personal history of nicotine dependence: Secondary | ICD-10-CM | POA: Diagnosis not present

## 2017-12-27 DIAGNOSIS — I10 Essential (primary) hypertension: Secondary | ICD-10-CM | POA: Insufficient documentation

## 2017-12-27 DIAGNOSIS — E781 Pure hyperglyceridemia: Secondary | ICD-10-CM | POA: Diagnosis not present

## 2017-12-27 DIAGNOSIS — D696 Thrombocytopenia, unspecified: Secondary | ICD-10-CM

## 2017-12-27 DIAGNOSIS — Z8249 Family history of ischemic heart disease and other diseases of the circulatory system: Secondary | ICD-10-CM | POA: Diagnosis not present

## 2017-12-27 MED ORDER — LISINOPRIL 10 MG PO TABS
10.0000 mg | ORAL_TABLET | Freq: Every day | ORAL | 0 refills | Status: DC
Start: 2017-12-27 — End: 2018-03-09

## 2017-12-27 NOTE — Patient Instructions (Signed)
Your repeat BP looked great.  128/54!! Continue the lisinopril 10 mg a day. There is a refill up at the pharmacy when you finish current bottle, but I have them holding it for you.    See ya in 6 months! Keep up the good work.    Hypertension Hypertension is another name for high blood pressure. High blood pressure forces your heart to work harder to pump blood. This can cause problems over time. There are two numbers in a blood pressure reading. There is a top number (systolic) over a bottom number (diastolic). It is best to have a blood pressure below 120/80. Healthy choices can help lower your blood pressure. You may need medicine to help lower your blood pressure if:  Your blood pressure cannot be lowered with healthy choices.  Your blood pressure is higher than 130/80.  Follow these instructions at home: Eating and drinking  If directed, follow the DASH eating plan. This diet includes: ? Filling half of your plate at each meal with fruits and vegetables. ? Filling one quarter of your plate at each meal with whole grains. Whole grains include whole wheat pasta, brown rice, and whole grain bread. ? Eating or drinking low-fat dairy products, such as skim milk or low-fat yogurt. ? Filling one quarter of your plate at each meal with low-fat (lean) proteins. Low-fat proteins include fish, skinless chicken, eggs, beans, and tofu. ? Avoiding fatty meat, cured and processed meat, or chicken with skin. ? Avoiding premade or processed food.  Eat less than 1,500 mg of salt (sodium) a day.  Limit alcohol use to no more than 1 drink a day for nonpregnant women and 2 drinks a day for men. One drink equals 12 oz of beer, 5 oz of wine, or 1 oz of hard liquor. Lifestyle  Work with your doctor to stay at a healthy weight or to lose weight. Ask your doctor what the best weight is for you.  Get at least 30 minutes of exercise that causes your heart to beat faster (aerobic exercise) most days of  the week. This may include walking, swimming, or biking.  Get at least 30 minutes of exercise that strengthens your muscles (resistance exercise) at least 3 days a week. This may include lifting weights or pilates.  Do not use any products that contain nicotine or tobacco. This includes cigarettes and e-cigarettes. If you need help quitting, ask your doctor.  Check your blood pressure at home as told by your doctor.  Keep all follow-up visits as told by your doctor. This is important. Medicines  Take over-the-counter and prescription medicines only as told by your doctor. Follow directions carefully.  Do not skip doses of blood pressure medicine. The medicine does not work as well if you skip doses. Skipping doses also puts you at risk for problems.  Ask your doctor about side effects or reactions to medicines that you should watch for. Contact a doctor if:  You think you are having a reaction to the medicine you are taking.  You have headaches that keep coming back (recurring).  You feel dizzy.  You have swelling in your ankles.  You have trouble with your vision. Get help right away if:  You get a very bad headache.  You start to feel confused.  You feel weak or numb.  You feel faint.  You get very bad pain in your: ? Chest. ? Belly (abdomen).  You throw up (vomit) more than once.  You have trouble  Summary  Hypertension is another name for high blood pressure.  Making healthy choices can help lower blood pressure. If your blood pressure cannot be controlled with healthy choices, you may need to take medicine. This information is not intended to replace advice given to you by your health care provider. Make sure you discuss any questions you have with your health care provider. Document Released: 12/09/2007 Document Revised: 05/20/2016 Document Reviewed: 05/20/2016 Elsevier Interactive Patient Education  2018 Elsevier Inc.  

## 2017-12-27 NOTE — Progress Notes (Signed)
Paul Phillips, Paul Phillips 1950/11/27, 67 y.o., male MRN: [id number] Patient Care Team    Relationship Specialty Notifications Start End  Ma Hillock, DO PCP - General Family Medicine  06/19/16     Chief Complaint  Patient presents with  . Hypertension     Subjective:   HTN/HLD/Morbid obesity/thrombocytopenia: Blood pressures ranges at home not routinely checked. Tolerating the lisnopril 10 mg QD. Patient denies chest pain, shortness of breath, dizziness or lower extremity edema.  Pt takes a daily baby ASA. Pt is not prescribed statin.  He is taking fish oil supplementation since his last visit. BMP: 07/07/2017 creatinine 1.16 CBC: 07/07/2017, normal limits with exception of chronic thrombocytopenia with platelets 124 Lipids:12/10/2017 Lipdids 167 ch, 51, HDL, 97 LDL, tg 92 TSH: 07/07/2017 within normal limits Diet: Started watching his diet very closely. Exercise: Is exercising routinely.  He has lost 12 pounds. RF: Hypertension, hypertriglyceridemia, former smoker, strong family history of heart disease, stroke and aneurysms  AAA screen:  [place] AAA in father. Brain aneurysm in his sister.  Former smoker.  67 year old male.  Shared decision making appointment completed concerning AAA screening once he turns 75 which will be at the end of August.  Patient is asymptomatic.  Depression screen [facility] 2/9 12/10/2017 07/07/2017 07/03/2016 06/19/2016  Decreased Interest 0 0 0 0  Down, Depressed, Hopeless - 0 0 0  PHQ - 2 Score 0 0 0 0    No Known Allergies Social History   Tobacco Use  . Smoking status: Former Smoker    Packs/day: 1.00    Years: 30.00    Pack years: 30.00    Types: Cigarettes    Last attempt to quit: 07/07/1999    Years since quitting: 18.4  . Smokeless tobacco: Never Used  Substance Use Topics  . Alcohol use: Yes    Alcohol/week: 6.0 oz    Types: 10 Cans of beer per week   Past Medical History:  Diagnosis Date  . Arthritis   . Dislocated thumb   . GERD  (gastroesophageal reflux disease)   . Heart murmur    Past Surgical History:  Procedure Laterality Date  . NO PAST SURGERIES     Family History  Problem Relation Age of Onset  . Arthritis Mother   . Diabetes Mother   . Hearing loss Mother   . Stroke Mother   . Hearing loss Father   . Heart disease Father   . AAA (abdominal aortic aneurysm) Father   . Aneurysm Sister 53       brain, died from aneurysm suddenly.   [name] attack Brother   . Arthritis/Rheumatoid Maternal Grandmother   . Brain cancer Maternal Grandfather   . Colon cancer Neg Hx    Allergies as of 12/27/2017   No Known Allergies     Medication List        Accurate as of 12/27/17  4:37 PM. Always use your most recent med list.          aspirin EC 81 MG tablet Take 81 mg by mouth daily.   Biotin 10 MG Tabs Take by mouth.   lisinopril 10 MG tablet Commonly known as:  PRINIVIL,ZESTRIL Take 1 tablet (10 mg total) by mouth daily.   ranitidine 150 MG capsule Commonly known as:  ZANTAC Take 150 mg by mouth 2 (two) times daily.   vitamin B-12 1000 MCG tablet Commonly known as:  CYANOCOBALAMIN Take 1,000 mcg by mouth daily.  All past medical history, surgical history, allergies, family history, immunizations andmedications were updated in the EMR today and reviewed under the history and medication portions of their EMR.     ROS: Negative, with the exception of above mentioned in HPI   Objective:  BP (!) 128/54 (BP Location: Right Arm, Patient Position: Sitting, Cuff Size: Large)   Pulse 68   Temp 98.4 F (36.9 C)   Resp 20   Ht 5\' 7"  (1.702 m)   Wt 221 lb (100.2 kg)   SpO2 97%   BMI 34.61 kg/m  Body mass index is 34.61 kg/m. Gen: Afebrile. No acute distress. Nontoxic in appearance, well developed, well nourished.  HENT: AT. [place].MMM.  Eyes:Pupils Equal Round Reactive to light, Extraocular movements intact,  Conjunctiva without redness, discharge or icterus. Neck/lymp/endocrine: Supple,no  lymphadenopathy, no thyromegaly CV: RRR no murmur, no edema, +2/4 P posterior tibialis pulses Chest: CTAB, no wheeze or crackles Abd: Soft. NTND. BS present. no Masses palpated.  Skin: no rashes, purpura or petechiae.  Neuro:  Normal gait. PERLA. EOMi. Alert. Oriented x3    No exam data present No results found. No results found for this or any previous visit (from the past 24 hour(s)).  Assessment/Plan: [name] is a 67 y.o. male present for OV for   Hypertriglyceridemia/Morbid obesity (HCC)/Essential hypertension - Bp much improved on lisinopril 10 mg QD. Declined statin, taking fish oil. Lost 12 lbs. Would benefit from statin if normal give fhx.  -continue diet and exercise, doing great.  Lost 12 pounds. continue lisinopril 10 mg daily Qd, refills provided today (on hold) -Aspirin continue - f/u 6 months Former smoker/Family history of abdominal aortic aneurysm (AAA) - Shared decision-making with patient completed today.  He is agreeable to AAA screening in September when he will be 67 years old. Asymptomatic.  - [place] ABDOMINAL AORTA SCREENING AAA; Future Thrombocytopenia (HCC) - Chronic. Stable. Continue to monitor yearly.    Reviewed expectations re: course of current medical issues.  Discussed self-management of symptoms.  Outlined signs and symptoms indicating need for more acute intervention.  Patient verbalized understanding and all questions were answered.  Patient received an After-Visit Summary.    No orders of the defined types were placed in this encounter.    Note is dictated utilizing voice recognition software. Although note has been proof read prior to signing, occasional typographical errors still can be missed. If any questions arise, please do not hesitate to call for verification.   electronically signed by:  Howard Pouch, DO  [place]

## 2018-02-15 DIAGNOSIS — C44329 Squamous cell carcinoma of skin of other parts of face: Secondary | ICD-10-CM | POA: Diagnosis not present

## 2018-03-09 ENCOUNTER — Telehealth: Payer: Self-pay | Admitting: Family Medicine

## 2018-03-09 MED ORDER — LISINOPRIL 10 MG PO TABS: 10.0000 mg | ORAL_TABLET | Freq: Every day | ORAL | 0 refills | Status: DC

## 2018-03-09 NOTE — Telephone Encounter (Signed)
Copied from [place] [id number]. Topic: Quick Communication - Rx Refill/Question >> Mar 09, 2018  1:19 PM Cecelia Byars, NT wrote: Medication: lisinopril (PRINIVIL,ZESTRIL) 10 MG tablet  Has the patient contacted their pharmacy? no: (Agent: If no, request that the patient contact the pharmacy for the refill. (Agent: If yes, when and what did the pharmacy advise?  Preferred Pharmacy (with phone number or street name [facility] [address], [place] - [place] [phone] (Phone) [phone] (Fax)    Agent: Please be advised that RX refills may take up to 3 business days. We ask that you follow-up with your pharmacy.

## 2018-03-21 ENCOUNTER — Telehealth: Payer: Self-pay | Admitting: Family Medicine

## 2018-03-21 ENCOUNTER — Ambulatory Visit
Admission: RE | Admit: 2018-03-21 | Discharge: 2018-03-21 | Disposition: A | Discharge status: Home / Self Care | Payer: BLUE CROSS/BLUE SHIELD | Source: Ambulatory Visit | Attending: Family Medicine | Admitting: Family Medicine

## 2018-03-21 DIAGNOSIS — Z87891 Personal history of nicotine dependence: Secondary | ICD-10-CM

## 2018-03-21 DIAGNOSIS — Z136 Encounter for screening for cardiovascular disorders: Secondary | ICD-10-CM | POA: Diagnosis not present

## 2018-03-21 DIAGNOSIS — Z8249 Family history of ischemic heart disease and other diseases of the circulatory system: Secondary | ICD-10-CM

## 2018-03-21 DIAGNOSIS — C44329 Squamous cell carcinoma of skin of other parts of face: Secondary | ICD-10-CM | POA: Diagnosis not present

## 2018-03-21 NOTE — Telephone Encounter (Signed)
Called patient left message for patient to return call. Ok for [facility] to review results.

## 2018-03-21 NOTE — Telephone Encounter (Signed)
Please inform patient the following information: His AAA screen resulted with very mild dilation of the aorta over the normal. Recommendations are rpt [place] in 5 years to keep close monitoring.

## 2018-03-22 NOTE — Telephone Encounter (Signed)
Pt given results per notes of Dr Raoul Pitch on 03/21/18. Unable to document in result note due to result note not being routed to [facility].

## 2018-03-28 DIAGNOSIS — L905 Scar conditions and fibrosis of skin: Secondary | ICD-10-CM | POA: Diagnosis not present

## 2018-04-18 DIAGNOSIS — L570 Actinic keratosis: Secondary | ICD-10-CM | POA: Diagnosis not present

## 2018-04-18 DIAGNOSIS — Z85828 Personal history of other malignant neoplasm of skin: Secondary | ICD-10-CM | POA: Diagnosis not present

## 2018-04-18 DIAGNOSIS — Z08 Encounter for follow-up examination after completed treatment for malignant neoplasm: Secondary | ICD-10-CM | POA: Diagnosis not present

## 2018-06-22 ENCOUNTER — Ambulatory Visit: Payer: BLUE CROSS/BLUE SHIELD | Admitting: Family Medicine

## 2018-06-22 ENCOUNTER — Encounter: Payer: Self-pay | Admitting: Family Medicine

## 2018-06-22 VITALS — BP 124/60 | HR 71 | Temp 98.2°F | Resp 16 | Ht 67.0 in | Wt 217.0 lb

## 2018-06-22 DIAGNOSIS — D696 Thrombocytopenia, unspecified: Secondary | ICD-10-CM

## 2018-06-22 DIAGNOSIS — I10 Essential (primary) hypertension: Secondary | ICD-10-CM | POA: Diagnosis not present

## 2018-06-22 DIAGNOSIS — M1990 Unspecified osteoarthritis, unspecified site: Secondary | ICD-10-CM | POA: Insufficient documentation

## 2018-06-22 DIAGNOSIS — Z23 Encounter for immunization: Secondary | ICD-10-CM

## 2018-06-22 DIAGNOSIS — E781 Pure hyperglyceridemia: Secondary | ICD-10-CM

## 2018-06-22 DIAGNOSIS — G479 Sleep disorder, unspecified: Secondary | ICD-10-CM | POA: Insufficient documentation

## 2018-06-22 DIAGNOSIS — Z8249 Family history of ischemic heart disease and other diseases of the circulatory system: Secondary | ICD-10-CM

## 2018-06-22 HISTORY — DX: Sleep disorder, unspecified: G47.9

## 2018-06-22 MED ORDER — DICLOFENAC SODIUM 75 MG PO TBEC: 75.0000 mg | DELAYED_RELEASE_TABLET | Freq: Two times a day (BID) | ORAL | 5 refills | Status: DC

## 2018-06-22 MED ORDER — LISINOPRIL 10 MG PO TABS: 10.0000 mg | ORAL_TABLET | Freq: Every day | ORAL | 1 refills | Status: DC

## 2018-06-22 NOTE — Progress Notes (Signed)
Paul Phillips, Paul Phillips 1951/06/23, 67 y.o., male MRN: [id number] Patient Care Team    Relationship Specialty Notifications Start End  Ma Hillock, DO PCP - General Family Medicine  06/19/16     Chief Complaint  Patient presents with  . Follow-up    HTN, Lipid, Pt is fasting. he notes that he has problems falling asleep and staying      Subjective:  HTN/HLD/Morbid obesity/thrombocytopenia: Pt reports compliance with  lisnopril 10 mg QD. Patient denies chest pain, shortness of breath, dizziness or lower extremity edema.  Pt takes a daily baby ASA. Pt is not prescribed statin-he refused.  He is taking fish oil supplementation since his last visit. BMP: 07/07/2017 creatinine 1.16 CBC: 07/07/2017, normal limits with exception of chronic thrombocytopenia with platelets 124 Lipids:12/10/2017 Lipdids 167 ch, 51, HDL, 97 LDL, tg 92 TSH: 07/07/2017 within normal limits Diet: Started watching his diet very closely. Exercise: Is exercising routinely.   RF: Hypertension, hypertriglyceridemia, former smoker, strong family history of heart disease, stroke and aneurysms   Sleep disturbance: pt reports he is not able to sleep well. He is waking many times throughout the night secondary to joint discomfort.  He states sometimes it is his back, neck or legs that wake him. He does not sleep on his back because he snores and feels he can not breath well. He takes multiple doses of advil and tylenol throughout the day. He has a fhx of rheumatoid arthritis.  Body mass index is 33.99 kg/m.   AAA screen:  [place] AAA in father. Brain aneurysm in his sister.  Former smoker. Screen completed for him, next due 03/2023.  Depression screen [facility] 2/9 12/10/2017 07/07/2017 07/03/2016 06/19/2016  Decreased Interest 0 0 0 0  Down, Depressed, Hopeless - 0 0 0  PHQ - 2 Score 0 0 0 0    No Known Allergies Social History   Tobacco Use  . Smoking status: Former Smoker    Packs/day: 1.00    Years: 30.00    Pack years:  30.00    Types: Cigarettes    Last attempt to quit: 07/07/1999    Years since quitting: 18.9  . Smokeless tobacco: Never Used  Substance Use Topics  . Alcohol use: Yes    Alcohol/week: 10.0 standard drinks    Types: 10 Cans of beer per week   Past Medical History:  Diagnosis Date  . Arthritis   . Dislocated thumb   . GERD (gastroesophageal reflux disease)   . Heart murmur    Past Surgical History:  Procedure Laterality Date  . NO PAST SURGERIES     Family History  Problem Relation Age of Onset  . Arthritis Mother   . Diabetes Mother   . Hearing loss Mother   . Stroke Mother   . Hearing loss Father   . Heart disease Father   . AAA (abdominal aortic aneurysm) Father   . Aneurysm Sister 61       brain, died from aneurysm suddenly.   [name] attack Brother   . Arthritis/Rheumatoid Maternal Grandmother   . Brain cancer Maternal Grandfather   . Colon cancer Neg Hx    Allergies as of 06/22/2018   No Known Allergies     Medication List       Accurate as of June 22, 2018  2:45 PM. Always use your most recent med list.        aspirin EC 81 MG tablet Take 81 mg by mouth daily.  Biotin 10 MG Tabs Take by mouth.   lisinopril 10 MG tablet Commonly known as:  PRINIVIL,ZESTRIL Take 1 tablet (10 mg total) by mouth daily.   ranitidine 150 MG capsule Commonly known as:  ZANTAC Take 150 mg by mouth 2 (two) times daily.   vitamin B-12 1000 MCG tablet Commonly known as:  CYANOCOBALAMIN Take 1,000 mcg by mouth daily.       All past medical history, surgical history, allergies, family history, immunizations andmedications were updated in the EMR today and reviewed under the history and medication portions of their EMR.     ROS: Negative, with the exception of above mentioned in HPI   Objective:  BP 124/60 (BP Location: Left Arm, Patient Position: Sitting, Cuff Size: Large)   Pulse 71   Temp 98.2 F (36.8 C) (Oral)   Resp 16   Ht 5\' 7"  (1.702 m)   Wt 217 lb  (98.4 kg)   SpO2 97%   BMI 33.99 kg/m  Body mass index is 33.99 kg/m. Gen: Afebrile. No acute distress. Nontoxic in appearance. Well developed caucasian obese male.  HENT: AT. [place].  MMM.  Eyes:Pupils Equal Round Reactive to light, Extraocular movements intact,  Conjunctiva without redness, discharge or icterus. Neck/lymp/endocrine: Supple,no lymphadenopathy, no thyromegaly CV: RRR no murmur, no edema, +2/4 P posterior tibialis pulses Chest: CTAB, no wheeze or crackles Abd: Soft. obese. NTND. BS present. no Masses palpated.  Neuro:  Normal gait. PERLA. EOMi. Alert. Oriented x3    No exam data present No results found. No results found for this or any previous visit (from the past 24 hour(s)).  Assessment/Plan: Dearius Hoffmann is a 67 y.o. male present for OV for   Hypertriglyceridemia/Morbid obesity (HCC)/Essential hypertension - Bp much improved on lisinopril 10 mg QD. Declined statin, taking fish oil. Lost 12 lbs. Would benefit from statin given fhx.  -continue diet and exercise continue lisinopril 10 mg daily Qd, refills provided today -Aspirin continue - CBC, TSH, lipid, CMP - f/u 6 months Former smoker/Family history of abdominal aortic aneurysm (AAA) - completed, rpt due 03/2023 Thrombocytopenia (HCC) - Mild Chronic. Has been stable despite daily use of NSAID.  - CBC collected today Arthritis/Sleep disturbance - discussed different options with him today. He is at high risk of OSA by STOP BANG criteria, however he is not interested in pursuing work up at this time.  He seems to be awakening because of discomfort mostly- so discussed stopping all tylenol and advil--> start diclofenac BID. - if not helping he needs to follow up sooner- not waiting until next appt. Would consider rheumatoid work up.  Immunization due - Pneumococcal polysaccharide vaccine 23-valent greater than or equal to 2yo subcutaneous/IM  F/U 6 months [facility]   Reviewed expectations re: course of  current medical issues.  Discussed self-management of symptoms.  Outlined signs and symptoms indicating need for more acute intervention.  Patient verbalized understanding and all questions were answered.  Patient received an After-Visit Summary.    No orders of the defined types were placed in this encounter.    Note is dictated utilizing voice recognition software. Although note has been proof read prior to signing, occasional typographical errors still can be missed. If any questions arise, please do not hesitate to call for verification.   electronically signed by:  Howard Pouch, DO  [place]

## 2018-06-22 NOTE — Patient Instructions (Addendum)
Start diclofenac every 12 hours daily- this is a daily arthritis medicine. This may help you sleep better. Stop tylenol and ibuprofen.   Your Blood pressure looks really good. I have refilled your meds.   Follow up in 6 months.   Please help [place] help you:  We are honored you have chosen [facility] for your Primary Care home. Below you will find basic instructions that you may need to access in the future. Please help [place] help you by reading the instructions, which cover many of the frequent questions we experience.   Prescription refills and request:  -In order to allow more efficient response time, please call your pharmacy for all refills. They will forward the request electronically to [place]. This allows for the quickest possible response. Request left on a nurse line can take longer to refill, since these are checked as time allows between office patients and other phone calls.  - refill request can take up to 3-5 working days to complete.  - If request is sent electronically and request is appropiate, it is usually completed in 1-2 business days.  - all patients will need to be seen routinely for all chronic medical conditions requiring prescription medications (see follow-up below). If you are overdue for follow up on your condition, you will be asked to make an appointment and we will call in enough medication to cover you until your appointment (up to 30 days).  - all controlled substances will require a face to face visit to request/refill.  - if you desire your prescriptions to go through a new pharmacy, and have an active script at original pharmacy, you will need to call your pharmacy and have scripts transferred to new pharmacy. This is completed between the pharmacy locations and not by your provider.    Results: If any images or labs were ordered, it can take up to 1 week to get results depending on the test ordered and the lab/facility running and resulting the test. - Normal or  stable results, which do not need further discussion, may be released to your mychart immediately with attached note to you. A call may not be generated for normal results. Please make certain to sign up for mychart. If you have questions on how to activate your mychart you can call the front office.  - If your results need further discussion, our office will attempt to contact you via phone, and if unable to reach you after 2 attempts, we will release your abnormal result to your mychart with instructions.  - All results will be automatically released in mychart after 1 week.  - Your provider will provide you with explanation and instruction on all relevant material in your results. Please keep in mind, results and labs may appear confusing or abnormal to the untrained eye, but it does not mean they are actually abnormal for you personally. If you have any questions about your results that are not covered, or you desire more detailed explanation than what was provided, you should make an appointment with your provider to do so.   Our office handles many outgoing and incoming calls daily. If we have not contacted you within 1 week about your results, please check your mychart to see if there is a message first and if not, then contact our office.  In helping with this matter, you help decrease call volume, and therefore allow [place] to be able to respond to patients needs more efficiently.   Acute office visits (  sick visit):  An acute visit is intended for a new problem and are scheduled in shorter time slots to allow schedule openings for patients with new problems. This is the appropriate visit to discuss a new problem. Problems will not be addressed by phone call or Echart message. Appointment is needed if requesting treatment. In order to provide you with excellent quality medical care with proper time for you to explain your problem, have an exam and receive treatment with instructions, these appointments  should be limited to one new problem per visit. If you experience a new problem, in which you desire to be addressed, please make an acute office visit, we save openings on the schedule to accommodate you. Please do not save your new problem for any other type of visit, let us take care of it properly and quickly for you.   Follow up visits:  Depending on your condition(s) your provider will need to see you routinely in order to provide you with quality care and prescribe medication(s). Most chronic conditions (Example: hypertension, Diabetes, depression/anxiety... etc), require visits a couple times a year. Your provider will instruct you on proper follow up for your personal medical conditions and history. Please make certain to make follow up appointments for your condition as instructed. Failing to do so could result in lapse in your medication treatment/refills. If you request a refill, and are overdue to be seen on a condition, we will always provide you with a 30 day script (once) to allow you time to schedule.    Medicare wellness (well visit): - we have a wonderful Nurse Maudie Mercury), that will meet with you and provide you will yearly medicare wellness visits. These visits should occur yearly (can not be scheduled less than 1 calendar year apart) and cover preventive health, immunizations, advance directives and screenings you are entitled to yearly through your medicare benefits. Do not miss out on your entitled benefits, this is when medicare will pay for these benefits to be ordered for you.  These are strongly encouraged by your provider and is the appropriate type of visit to make certain you are up to date with all preventive health benefits. If you have not had your medicare wellness exam in the last 12 months, please make certain to schedule one by calling the office and schedule your medicare wellness with Maudie Mercury as soon as possible.   Yearly physical (well visit):  - Adults are recommended to be  seen yearly for physicals. Check with your insurance and date of your last physical, most insurances require one calendar year between physicals. Physicals include all preventive health topics, screenings, medical exam and labs that are appropriate for gender/age and history. You may have fasting labs needed at this visit. This is a well visit (not a sick visit), new problems should not be covered during this visit (see acute visit).  - Pediatric patients are seen more frequently when they are younger. Your provider will advise you on well child visit timing that is appropriate for your their age. - This is not a medicare wellness visit. Medicare wellness exams do not have an exam portion to the visit. Some medicare companies allow for a physical, some do not allow a yearly physical. If your medicare allows a yearly physical you can schedule the medicare wellness with our nurse Maudie Mercury and have your physical with your provider after, on the same day. Please check with insurance for your full benefits.   Late Policy/No Shows:  - all new  patients should arrive 15-30 minutes earlier than appointment to allow [place] time  to  obtain all personal demographics,  insurance information and for you to complete office paperwork. - All established patients should arrive 10-15 minutes earlier than appointment time to update all information and be checked in .  - In our best efforts to run on time, if you are late for your appointment you will be asked to either reschedule or if able, we will work you back into the schedule. There will be a wait time to work you back in the schedule,  depending on availability.  - If you are unable to make it to your appointment as scheduled, please call 24 hours ahead of time to allow [place] to fill the time slot with someone else who needs to be seen. If you do not cancel your appointment ahead of time, you may be charged a no show fee.

## 2018-06-23 LAB — CBC
HCT: 45 % (ref 38.5–50.0)
Hemoglobin: 15.1 g/dL (ref 13.2–17.1)
MCH: 29.4 pg (ref 27.0–33.0)
MCHC: 33.6 g/dL (ref 32.0–36.0)
MCV: 87.7 fL (ref 80.0–100.0)
MPV: 10.7 fL (ref 7.5–12.5)
PLATELETS: 169 10*3/uL (ref 140–400)
RBC: 5.13 10*6/uL (ref 4.20–5.80)
RDW: 13.5 % (ref 11.0–15.0)
WBC: 5.9 10*3/uL (ref 3.8–10.8)

## 2018-06-23 LAB — COMPREHENSIVE METABOLIC PANEL
AG RATIO: 1.6 (calc) (ref 1.0–2.5)
ALT: 12 U/L (ref 9–46)
AST: 13 U/L (ref 10–35)
Albumin: 4.1 g/dL (ref 3.6–5.1)
Alkaline phosphatase (APISO): 58 U/L (ref 40–115)
BILIRUBIN TOTAL: 0.7 mg/dL (ref 0.2–1.2)
BUN: 24 mg/dL (ref 7–25)
CHLORIDE: 110 mmol/L (ref 98–110)
CO2: 21 mmol/L (ref 20–32)
Calcium: 10.1 mg/dL (ref 8.6–10.3)
Creat: 1.04 mg/dL (ref 0.70–1.25)
GLOBULIN: 2.6 g/dL (ref 1.9–3.7)
GLUCOSE: 81 mg/dL (ref 65–99)
POTASSIUM: 4.7 mmol/L (ref 3.5–5.3)
SODIUM: 140 mmol/L (ref 135–146)
TOTAL PROTEIN: 6.7 g/dL (ref 6.1–8.1)

## 2018-06-23 LAB — LIPID PANEL
Cholesterol: 179 mg/dL (ref <200)
HDL: 47 mg/dL (ref >40)
LDL Cholesterol (Calc): 111 mg/dL (calc) — ABNORMAL HIGH
Non-HDL Cholesterol (Calc): 132 mg/dL (calc) — ABNORMAL HIGH (ref <130)
TRIGLYCERIDES: 105 mg/dL (ref <150)
Total CHOL/HDL Ratio: 3.8 (calc) (ref <5.0)

## 2018-06-23 LAB — TSH: TSH: 0.87 m[IU]/L (ref 0.40–4.50)

## 2018-11-22 ENCOUNTER — Telehealth: Payer: Self-pay | Admitting: Family Medicine

## 2018-11-22 NOTE — Telephone Encounter (Signed)
Pt wife said social security needs on our letterhead the pt full name, dob , our address and patient phone number and his full address. Please mail letter to home address of patient

## 2018-11-22 NOTE — Telephone Encounter (Signed)
Patient's wife states she spoke with our office yesterday afternoon about paperwork for social security. Please call patient back.

## 2018-11-30 NOTE — Telephone Encounter (Signed)
Pt's wife stated to call her when ready and she will come and pick it up.

## 2018-11-30 NOTE — Telephone Encounter (Signed)
Spoke with patient, I told him I was unaware of the form that he needed to provide to Medicare. He explained to me  Medicare did not have his sir name as being a "Paul Phillips."  He is now eligible for Medicare. I  He will verify form details with Medicare and call me back this afternoon.

## 2018-12-02 NOTE — Telephone Encounter (Signed)
Patient brought in form from [employer] requesting patient print out from PCP office. Patient mailing form. Printed after visit summary from last OV. Scanned form to documents.

## 2018-12-20 DIAGNOSIS — D485 Neoplasm of uncertain behavior of skin: Secondary | ICD-10-CM | POA: Diagnosis not present

## 2018-12-20 DIAGNOSIS — E508 Other manifestations of vitamin A deficiency: Secondary | ICD-10-CM | POA: Diagnosis not present

## 2018-12-20 DIAGNOSIS — C44329 Squamous cell carcinoma of skin of other parts of face: Secondary | ICD-10-CM | POA: Diagnosis not present

## 2018-12-20 DIAGNOSIS — L570 Actinic keratosis: Secondary | ICD-10-CM | POA: Diagnosis not present

## 2018-12-21 ENCOUNTER — Other Ambulatory Visit: Payer: Self-pay

## 2018-12-21 ENCOUNTER — Ambulatory Visit: Payer: Self-pay | Admitting: Family Medicine

## 2018-12-21 ENCOUNTER — Encounter: Payer: Self-pay | Admitting: Family Medicine

## 2018-12-21 ENCOUNTER — Ambulatory Visit (INDEPENDENT_AMBULATORY_CARE_PROVIDER_SITE_OTHER): Payer: BC Managed Care – PPO | Admitting: Family Medicine

## 2018-12-21 VITALS — BP 122/60 | HR 76 | Temp 98.6°F | Resp 18 | Wt 221.4 lb

## 2018-12-21 DIAGNOSIS — E781 Pure hyperglyceridemia: Secondary | ICD-10-CM | POA: Diagnosis not present

## 2018-12-21 DIAGNOSIS — I10 Essential (primary) hypertension: Secondary | ICD-10-CM

## 2018-12-21 DIAGNOSIS — Z8249 Family history of ischemic heart disease and other diseases of the circulatory system: Secondary | ICD-10-CM

## 2018-12-21 DIAGNOSIS — D696 Thrombocytopenia, unspecified: Secondary | ICD-10-CM

## 2018-12-21 DIAGNOSIS — Z5320 Procedure and treatment not carried out because of patient's decision for unspecified reasons: Secondary | ICD-10-CM

## 2018-12-21 DIAGNOSIS — M1990 Unspecified osteoarthritis, unspecified site: Secondary | ICD-10-CM

## 2018-12-21 MED ORDER — DICLOFENAC SODIUM 75 MG PO TBEC: 75.0000 mg | DELAYED_RELEASE_TABLET | Freq: Two times a day (BID) | ORAL | 1 refills | Status: DC

## 2018-12-21 MED ORDER — LISINOPRIL 10 MG PO TABS: 10.0000 mg | ORAL_TABLET | Freq: Every day | ORAL | 1 refills | Status: DC

## 2018-12-21 NOTE — Patient Instructions (Addendum)
Follow up in 6 months for physical and chronic conditions It was nice to see you  Today.  Your BP looks great! I refilled meds.   Stay safe!  Please help [place] help you:  We are honored you have chosen [facility] for your Primary Care home. Below you will find basic instructions that you may need to access in the future. Please help [place] help you by reading the instructions, which cover many of the frequent questions we experience.   Prescription refills and request:  -In order to allow more efficient response time, please call your pharmacy for all refills. They will forward the request electronically to [place]. This allows for the quickest possible response. Request left on a nurse line can take longer to refill, since these are checked as time allows between office patients and other phone calls.  - refill request can take up to 3-5 working days to complete.  - If request is sent electronically and request is appropiate, it is usually completed in 1-2 business days.  - all patients will need to be seen routinely for all chronic medical conditions requiring prescription medications (see follow-up below). If you are overdue for follow up on your condition, you will be asked to make an appointment and we will call in enough medication to cover you until your appointment (up to 30 days).  - all controlled substances will require a face to face visit to request/refill.  - if you desire your prescriptions to go through a new pharmacy, and have an active script at original pharmacy, you will need to call your pharmacy and have scripts transferred to new pharmacy. This is completed between the pharmacy locations and not by your provider.    Results: If any images or labs were ordered, it can take up to 1 week to get results depending on the test ordered and the lab/facility running and resulting the test. - Normal or stable results, which do not need further discussion, may be released to your mychart  immediately with attached note to you. A call may not be generated for normal results. Please make certain to sign up for mychart. If you have questions on how to activate your mychart you can call the front office.  - If your results need further discussion, our office will attempt to contact you via phone, and if unable to reach you after 2 attempts, we will release your abnormal result to your mychart with instructions.  - All results will be automatically released in mychart after 1 week.  - Your provider will provide you with explanation and instruction on all relevant material in your results. Please keep in mind, results and labs may appear confusing or abnormal to the untrained eye, but it does not mean they are actually abnormal for you personally. If you have any questions about your results that are not covered, or you desire more detailed explanation than what was provided, you should make an appointment with your provider to do so.   Our office handles many outgoing and incoming calls daily. If we have not contacted you within 1 week about your results, please check your mychart to see if there is a message first and if not, then contact our office.  In helping with this matter, you help decrease call volume, and therefore allow [place] to be able to respond to patients needs more efficiently.   Acute office visits (sick visit):  An acute visit is intended for a new problem and  are scheduled in shorter time slots to allow schedule openings for patients with new problems. This is the appropriate visit to discuss a new problem. Problems will not be addressed by phone call or Echart message. Appointment is needed if requesting treatment. In order to provide you with excellent quality medical care with proper time for you to explain your problem, have an exam and receive treatment with instructions, these appointments should be limited to one new problem per visit. If you experience a new problem, in  which you desire to be addressed, please make an acute office visit, we save openings on the schedule to accommodate you. Please do not save your new problem for any other type of visit, let us take care of it properly and quickly for you.   Follow up visits:  Depending on your condition(s) your provider will need to see you routinely in order to provide you with quality care and prescribe medication(s). Most chronic conditions (Example: hypertension, Diabetes, depression/anxiety... etc), require visits a couple times a year. Your provider will instruct you on proper follow up for your personal medical conditions and history. Please make certain to make follow up appointments for your condition as instructed. Failing to do so could result in lapse in your medication treatment/refills. If you request a refill, and are overdue to be seen on a condition, we will always provide you with a 30 day script (once) to allow you time to schedule.    Medicare wellness (well visit): - we have a wonderful Nurse Maudie Mercury), that will meet with you and provide you will yearly medicare wellness visits. These visits should occur yearly (can not be scheduled less than 1 calendar year apart) and cover preventive health, immunizations, advance directives and screenings you are entitled to yearly through your medicare benefits. Do not miss out on your entitled benefits, this is when medicare will pay for these benefits to be ordered for you.  These are strongly encouraged by your provider and is the appropriate type of visit to make certain you are up to date with all preventive health benefits. If you have not had your medicare wellness exam in the last 12 months, please make certain to schedule one by calling the office and schedule your medicare wellness with Maudie Mercury as soon as possible.   Yearly physical (well visit):  - Adults are recommended to be seen yearly for physicals. Check with your insurance and date of your last physical,  most insurances require one calendar year between physicals. Physicals include all preventive health topics, screenings, medical exam and labs that are appropriate for gender/age and history. You may have fasting labs needed at this visit. This is a well visit (not a sick visit), new problems should not be covered during this visit (see acute visit).  - Pediatric patients are seen more frequently when they are younger. Your provider will advise you on well child visit timing that is appropriate for your their age. - This is not a medicare wellness visit. Medicare wellness exams do not have an exam portion to the visit. Some medicare companies allow for a physical, some do not allow a yearly physical. If your medicare allows a yearly physical you can schedule the medicare wellness with our nurse Maudie Mercury and have your physical with your provider after, on the same day. Please check with insurance for your full benefits.   Late Policy/No Shows:  - all new patients should arrive 15-30 minutes earlier than appointment to allow [place] time  to  obtain all personal demographics,  insurance information and for you to complete office paperwork. - All established patients should arrive 10-15 minutes earlier than appointment time to update all information and be checked in .  - In our best efforts to run on time, if you are late for your appointment you will be asked to either reschedule or if able, we will work you back into the schedule. There will be a wait time to work you back in the schedule,  depending on availability.  - If you are unable to make it to your appointment as scheduled, please call 24 hours ahead of time to allow [place] to fill the time slot with someone else who needs to be seen. If you do not cancel your appointment ahead of time, you may be charged a no show fee.

## 2018-12-21 NOTE — Progress Notes (Signed)
Paul Phillips, [place] 04/09/1951, 68 y.o., male MRN: [id number] Patient Care Team    Relationship Specialty Notifications Start End  Ma Hillock, DO PCP - General Family Medicine  06/19/16   Orbie Hurst, MD Referring Physician Dermatology  12/21/18     Chief Complaint  Patient presents with  . 6 month f/u     Subjective:  HTN/HLD/Morbid obesity/thrombocytopenia: Pt reports compliance with  lisnopril 10 mg QD.Patient denies chest pain, shortness of breath, dizziness or lower extremity edema.  Pt takes a daily baby ASA. Pt is not prescribed statin-he refused.  He is taking fish oil supplementation since his last visit. BMP: 06/2018 WNL CBC: 06/2018, normal limits Lipids: 06/2018  TSH: 06/2018 within normal limits Diet: Started watching his diet very closely. Exercise: Is exercising routinely.   RF: Hypertension, hypertriglyceridemia, former smoker, strong family history of heart disease, stroke and aneurysms   Depression screen [facility] 2/9 12/21/2018 12/10/2017 07/07/2017 07/03/2016 06/19/2016  Decreased Interest 0 0 0 0 0  Down, Depressed, Hopeless 0 - 0 0 0  PHQ - 2 Score 0 0 0 0 0    No Known Allergies Social History   Tobacco Use  . Smoking status: Former Smoker    Packs/day: 1.00    Years: 30.00    Pack years: 30.00    Types: Cigarettes    Quit date: 07/07/1999    Years since quitting: 19.4  . Smokeless tobacco: Never Used  Substance Use Topics  . Alcohol use: Yes    Alcohol/week: 10.0 standard drinks    Types: 10 Cans of beer per week   Past Medical History:  Diagnosis Date  . Arthritis   . Dislocated thumb   . GERD (gastroesophageal reflux disease)   . Heart murmur    Past Surgical History:  Procedure Laterality Date  . NO PAST SURGERIES     Family History  Problem Relation Age of Onset  . Arthritis Mother   . Diabetes Mother   . Hearing loss Mother   . Stroke Mother   . Hearing loss Father   . Heart disease Father   . AAA (abdominal aortic  aneurysm) Father   . Aneurysm Sister 37       brain, died from aneurysm suddenly.   [name] attack Brother   . Arthritis/Rheumatoid Maternal Grandmother   . Brain cancer Maternal Grandfather   . Colon cancer Neg Hx    Allergies as of 12/21/2018   No Known Allergies     Medication List       Accurate as of December 21, 2018  2:55 PM. If you have any questions, ask your nurse or doctor.        aspirin EC 81 MG tablet Take 81 mg by mouth daily.   Biotin 10 MG Tabs Take by mouth.   diclofenac 75 MG EC tablet Commonly known as: VOLTAREN Take 1 tablet (75 mg total) by mouth 2 (two) times daily.   lisinopril 10 MG tablet Commonly known as: ZESTRIL Take 1 tablet (10 mg total) by mouth daily.   vitamin B-12 1000 MCG tablet Commonly known as: CYANOCOBALAMIN Take 1,000 mcg by mouth daily.       All past medical history, surgical history, allergies, family history, immunizations andmedications were updated in the EMR today and reviewed under the history and medication portions of their EMR.     ROS: Negative, with the exception of above mentioned in HPI   Objective:  BP 122/60 (BP  Location: Left Arm, Patient Position: Sitting, Cuff Size: Normal)   Pulse 76   Temp 98.6 F (37 C) (Oral)   Resp 18   Wt 221 lb 6 oz (100.4 kg)   SpO2 97%   BMI 34.67 kg/m  Body mass index is 34.67 kg/m. Gen: Afebrile. No acute distress.  HENT: AT. .MMM.  Eyes:Pupils Equal Round Reactive to light, Extraocular movements intact,  Conjunctiva without redness, discharge or icterus. Neck/lymp/endocrine: Supple,no lymphadenopathy, no thyromegaly CV: RRR no  murmur, no edema, +2/4 P posterior tibialis pulses Chest: CTAB, no wheeze or crackles Abd: Soft. obese. NTND. BS present.  Neuro:  Normal gait. PERLA. EOMi. Alert. Oriented.    No exam data present No results found. No results found for this or any previous visit (from the past 24 hour(s)).  Assessment/Plan: Paul Phillips is a  68 y.o. male present for OV for   Hypertriglyceridemia/Morbid obesity (HCC)/Essential hypertension - Stable. Continue  lisinopril 10 mg QD. Refills provided today Declined statin, taking fish oil.  Would benefit from statin given fhx- still refuses. -continue diet and exercise -Aspirin continue - f/u 6 months for CPE Former smoker/Family history of abdominal aortic aneurysm (AAA) - completed, rpt due 03/2023 Thrombocytopenia (HCC) - Mild Chronic. Has been stable despite daily use of NSAID.  Arthritis Stable with Voltaren. Refills provided today.   F/U 6 months CPE   Reviewed expectations re: course of current medical issues.  Discussed self-management of symptoms.  Outlined signs and symptoms indicating need for more acute intervention.  Patient verbalized understanding and all questions were answered.  Patient received an After-Visit Summary.    No orders of the defined types were placed in this encounter.  > 15 minutes spent with patient, > 50% of that time face to face     Note is dictated utilizing voice recognition software. Although note has been proof read prior to signing, occasional typographical errors still can be missed. If any questions arise, please do not hesitate to call for verification.   electronically signed by:  Howard Pouch, DO  [place]

## 2019-01-03 DIAGNOSIS — C44329 Squamous cell carcinoma of skin of other parts of face: Secondary | ICD-10-CM | POA: Diagnosis not present

## 2019-04-25 DIAGNOSIS — Z08 Encounter for follow-up examination after completed treatment for malignant neoplasm: Secondary | ICD-10-CM | POA: Diagnosis not present

## 2019-04-25 DIAGNOSIS — Z85828 Personal history of other malignant neoplasm of skin: Secondary | ICD-10-CM | POA: Diagnosis not present

## 2019-04-25 DIAGNOSIS — L570 Actinic keratosis: Secondary | ICD-10-CM | POA: Diagnosis not present

## 2019-04-25 DIAGNOSIS — L821 Other seborrheic keratosis: Secondary | ICD-10-CM | POA: Diagnosis not present

## 2019-06-26 ENCOUNTER — Ambulatory Visit: Payer: BC Managed Care – PPO | Admitting: Family Medicine

## 2019-06-26 ENCOUNTER — Other Ambulatory Visit: Payer: Self-pay

## 2019-06-26 ENCOUNTER — Encounter: Payer: Self-pay | Admitting: Family Medicine

## 2019-06-26 VITALS — BP 138/62 | HR 76 | Temp 98.2°F | Resp 17 | Ht 66.0 in | Wt 231.0 lb

## 2019-06-26 DIAGNOSIS — E781 Pure hyperglyceridemia: Secondary | ICD-10-CM | POA: Diagnosis not present

## 2019-06-26 DIAGNOSIS — Z125 Encounter for screening for malignant neoplasm of prostate: Secondary | ICD-10-CM

## 2019-06-26 DIAGNOSIS — Z131 Encounter for screening for diabetes mellitus: Secondary | ICD-10-CM

## 2019-06-26 DIAGNOSIS — Z0000 Encounter for general adult medical examination without abnormal findings: Secondary | ICD-10-CM

## 2019-06-26 DIAGNOSIS — Z5320 Procedure and treatment not carried out because of patient's decision for unspecified reasons: Secondary | ICD-10-CM

## 2019-06-26 DIAGNOSIS — M1990 Unspecified osteoarthritis, unspecified site: Secondary | ICD-10-CM

## 2019-06-26 DIAGNOSIS — Z8249 Family history of ischemic heart disease and other diseases of the circulatory system: Secondary | ICD-10-CM

## 2019-06-26 DIAGNOSIS — K635 Polyp of colon: Secondary | ICD-10-CM

## 2019-06-26 DIAGNOSIS — D696 Thrombocytopenia, unspecified: Secondary | ICD-10-CM

## 2019-06-26 DIAGNOSIS — I10 Essential (primary) hypertension: Secondary | ICD-10-CM

## 2019-06-26 MED ORDER — LISINOPRIL 10 MG PO TABS: 10.0000 mg | ORAL_TABLET | Freq: Every day | ORAL | 1 refills | Status: DC

## 2019-06-26 MED ORDER — ZOSTER VAC RECOMB ADJUVANTED 50 MCG/0.5ML IM SUSR: 0.5000 mL | Freq: Once | INTRAMUSCULAR | 1 refills | Status: AC

## 2019-06-26 MED ORDER — DICLOFENAC SODIUM 75 MG PO TBEC: 75.0000 mg | DELAYED_RELEASE_TABLET | Freq: Two times a day (BID) | ORAL | 1 refills | Status: DC

## 2019-06-26 NOTE — Patient Instructions (Signed)

## 2019-06-26 NOTE — Progress Notes (Signed)
This visit occurred during the SARS-CoV-2 public health emergency.  Safety protocols were in place, including screening questions prior to the visit, additional usage of staff PPE, and extensive cleaning of exam room while observing appropriate contact time as indicated for disinfecting solutions.    Patient ID: Paul Phillips, male  DOB: Dec 01, 1950, 68 y.o.   MRN: [id number] Patient Care Team    Relationship Specialty Notifications Start End  Ma Hillock, DO PCP - General Family Medicine  06/19/16   Orbie Hurst, MD Referring Physician Dermatology  12/21/18     Chief Complaint  Patient presents with  . Annual Exam    Fasting.     Subjective:  Artice Phillips is a 68 y.o. male present for CPE. All past medical history, surgical history, allergies, family history, immunizations, medications and social history were updated in the electronic medical record today. All recent labs, ED visits and hospitalizations within the last year were reviewed.  Health maintenance:  Colonoscopy: Completed 10/26/2016, by Dr. Fuller Plan. Multiple polyps, 3 year follow-up recommended. Referral placed Immunizations:  tdap UTD 2017, influenza UTD 2020, Prevnar 13 07/03/2016 (PSV23 next year at 60) , shingrix printed last year - printed again Infectious disease screening: HIV and Hep C negative. PSA:  Lab Results  Component Value Date   PSA 1.2 07/07/2017   PSA 1.39 07/01/2016  , pt was counseled on prostate cancer screenings.  Assistive device: none Oxygen CHE:NIDP Patient has a Dental home. Hospitalizations/ED visits: reviewed  Depression screen [facility] 2/9 06/26/2019 12/21/2018 12/10/2017 07/07/2017 07/03/2016  Decreased Interest 0 0 0 0 0  Down, Depressed, Hopeless 0 0 - 0 0  PHQ - 2 Score 0 0 0 0 0   No flowsheet data found.       Fall Risk  12/21/2018 12/10/2017 07/03/2016 06/19/2016  Falls in the past year? 0 No No No      Immunization History  Administered Date(s)  Administered  . Hepatitis B, ped/adol 09/22/2013  . Influenza-Unspecified 04/05/2017, 04/26/2018, 03/07/2019  . Pneumococcal Conjugate-13 07/03/2016  . Pneumococcal Polysaccharide-23 06/22/2018  . Tdap 07/03/2016     Past Medical History:  Diagnosis Date  . Arthritis   . Dislocated thumb   . GERD (gastroesophageal reflux disease)   . Heart murmur    No Known Allergies Past Surgical History:  Procedure Laterality Date  . NO PAST SURGERIES     Family History  Problem Relation Age of Onset  . Arthritis Mother   . Diabetes Mother   . Hearing loss Mother   . Stroke Mother   . Hearing loss Father   . Heart disease Father   . AAA (abdominal aortic aneurysm) Father   . Aneurysm Sister 47       brain, died from aneurysm suddenly.   [name] attack Brother   . Arthritis/Rheumatoid Maternal Grandmother   . Brain cancer Maternal Grandfather   . Colon cancer Neg Hx    Social History   Social History Narrative   Married to [place]. 1 adult child [place].   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    Former smoker, quit 2001   Drinks caffeine, takes a daily vitamin.   Wear seatbelt. Smoke detector in the home. Firearms in the home.   safe in his relationships.    Allergies as of 06/26/2019   No Known Allergies     Medication List       Accurate as of June 26, 2019  3:18 PM. If  you have any questions, ask your nurse or doctor.        aspirin EC 81 MG tablet Take 81 mg by mouth daily.   Biotin 10 MG Tabs Take by mouth.   diclofenac 75 MG EC tablet Commonly known as: VOLTAREN Take 1 tablet (75 mg total) by mouth 2 (two) times daily.   lisinopril 10 MG tablet Commonly known as: ZESTRIL Take 1 tablet (10 mg total) by mouth daily.   vitamin B-12 1000 MCG tablet Commonly known as: CYANOCOBALAMIN Take 1,000 mcg by mouth daily.   Zoster Vaccine Adjuvanted injection Commonly known as: SHINGRIX Inject 0.5 mLs into the muscle once for 1 dose. Rpt injection in  2-6 months. Started by: Howard Pouch, DO      All past medical history, surgical history, allergies, family history, immunizations andmedications were updated in the EMR today and reviewed under the history and medication portions of their EMR.     No results found for this or any previous visit (from the past 2160 hour(s)).  US ABDOMINAL AORTA SCREENING AAA  Result Date: 03/21/2018 CLINICAL DATA:  Screening, previous tobacco abuse EXAM: ULTRASOUND OF ABDOMINAL AORTA TECHNIQUE: Ultrasound examination of the abdominal aorta was performed to evaluate for abdominal aortic aneurysm. COMPARISON:  None. FINDINGS: Abdominal aortic measurements as follows: Proximal:  2.6 x 2.5 cm Mid:  2.3 x 2.3 cm Distal:  2.1 x 2.5 cm IMPRESSION: Ectatic abdominal aorta at risk for aneurysm development. Recommend followup by ultrasound in 5 years. This recommendation follows ACR consensus guidelines: White Paper of the ACR Incidental Findings Committee II on Vascular Findings. J Am Coll Radiol 2013; 10:789-794. Electronically Signed   By: Lucrezia Europe M.D.   On: 03/21/2018 08:34     ROS: 14 pt review of systems performed and negative (unless mentioned in an HPI)  Objective: BP 138/62 (BP Location: Right Arm, Patient Position: Sitting, Cuff Size: Normal)   Pulse 76   Temp 98.2 F (36.8 C) (Temporal)   Resp 17   Ht '5\' 6"'  (1.676 m)   Wt 231 lb (104.8 kg)   SpO2 97%   BMI 37.28 kg/m  Gen: Afebrile. No acute distress. Nontoxic in appearance, well-developed, well-nourished,  Obese, pleasant caucasian male.  HENT: AT. [place]. Bilateral TM visualized and normal in appearance, normal external auditory canal. MMM, no oral lesions, adequate dentition. Bilateral nares within normal limits. Throat without erythema, ulcerations or exudates. no Cough on exam, no hoarseness on exam. Eyes:Pupils Equal Round Reactive to light, Extraocular movements intact,  Conjunctiva without redness, discharge or icterus. Neck/lymp/endocrine:  Supple,no lymphadenopathy, no thyromegaly CV: RRR no murmur, no edema, +2/4 P posterior tibialis pulses. no carotid bruits. No JVD. Chest: CTAB, no wheeze, rhonchi or crackles. Normal  Respiratory effort. good Air movement. Abd: Soft. obese. NTND. BS present. no Masses palpated. No hepatosplenomegaly. No rebound tenderness or guarding. Skin: no rashes, purpura or petechiae. Warm and well-perfused. Skin intact. Neuro/Msk:  Normal gait. PERLA. EOMi. Alert. Oriented x3.  Cranial nerves II through XII intact. Muscle strength 5/5 upper/lower extremity. DTRs equal bilaterally. Psych: Normal affect, dress and demeanor. Normal speech. Normal thought content and judgment.  No exam data present  Assessment/plan: Demitris Pokorny is a 69 y.o. male present for CPE Hypertension/obesity/Statin declinedFamily history of abdominal aortic aneurysm (AAA)/hypertriglycerides:  - stable. Lisinopril 10 mg every day refilled.  - CBC w/Diff - Comp Met (CMET) - TSH - AAA screen q 5 yrs due 03/2023 - f/u 6 mos.  Polyp of colon,  unspecified part of colon, unspecified type Follow-up colonoscopy due 2021>> referred today Diabetes mellitus screening - HgB A1c Screening for deficiency anemia - CBC w/Diff Prostate cancer screening - PSA Thrombocytopenia (HCC) CBC Arthritis Refilled dicolfenac BID F/u 6 mos.  Encounter for preventive health examination Patient was encouraged to exercise greater than 150 minutes a week. Patient was encouraged to choose a diet filled with fresh fruits and vegetables, and lean meats. AVS provided to patient today for education/recommendation on gender specific health and safety maintenance. Colonoscopy: Completed 10/26/2016, by Dr. Fuller Plan. Multiple polyps, 3 year follow-up recommended. Referral placed Immunizations:  tdap UTD 2017, influenza UTD 2020, Prevnar 13 07/03/2016 (PSV23 next year at 23) , shingrix printed last year - printed again Infectious disease screening: HIV  and Hep C negative. PSA: collected today  Return in about 6 months (around 12/25/2019) for [facility] (30 min).  1 yr for CPE  Orders Placed This Encounter  Procedures  . CBC  . Comp Met (CMET)  . HgB A1c  . Lipid panel  . TSH  . PSA  . Ambulatory referral to Gastroenterology     Note is dictated utilizing voice recognition software. Although note has been proof read prior to signing, occasional typographical errors still can be missed. If any questions arise, please do not hesitate to call for verification.  Electronically signed by: Howard Pouch, DO [place]

## 2019-06-27 LAB — LIPID PANEL
Cholesterol: 168 mg/dL (ref 0–200)
HDL: 43.8 mg/dL (ref >39.00)
LDL Cholesterol: 95 mg/dL (ref 0–99)
NonHDL: 124.4
Total CHOL/HDL Ratio: 4
Triglycerides: 146 mg/dL (ref 0.0–149.0)
VLDL: 29.2 mg/dL (ref 0.0–40.0)

## 2019-06-27 LAB — CBC
HCT: 44.4 % (ref 39.0–52.0)
Hemoglobin: 15.1 g/dL (ref 13.0–17.0)
MCHC: 34.1 g/dL (ref 30.0–36.0)
MCV: 89.5 fl (ref 78.0–100.0)
Platelets: 129 10*3/uL — ABNORMAL LOW (ref 150.0–400.0)
RBC: 4.96 Mil/uL (ref 4.22–5.81)
RDW: 14.3 % (ref 11.5–15.5)
WBC: 5.9 10*3/uL (ref 4.0–10.5)

## 2019-06-27 LAB — COMPREHENSIVE METABOLIC PANEL
ALT: 18 U/L (ref 0–53)
AST: 14 U/L (ref 0–37)
Albumin: 4.1 g/dL (ref 3.5–5.2)
Alkaline Phosphatase: 51 U/L (ref 39–117)
BUN: 24 mg/dL — ABNORMAL HIGH (ref 6–23)
CO2: 20 mEq/L (ref 19–32)
Calcium: 9.6 mg/dL (ref 8.4–10.5)
Chloride: 109 mEq/L (ref 96–112)
Creatinine, Ser: 0.97 mg/dL (ref 0.40–1.50)
GFR: 77.36 mL/min (ref >60.00)
Glucose, Bld: 89 mg/dL (ref 70–99)
Potassium: 4.1 mEq/L (ref 3.5–5.1)
Sodium: 136 mEq/L (ref 135–145)
Total Bilirubin: 0.7 mg/dL (ref 0.2–1.2)
Total Protein: 6.2 g/dL (ref 6.0–8.3)

## 2019-06-27 LAB — HEMOGLOBIN A1C: Hgb A1c MFr Bld: 5.2 % (ref 4.6–6.5)

## 2019-06-27 LAB — PSA: PSA: 0.79 ng/mL (ref 0.10–4.00)

## 2019-06-27 LAB — TSH: TSH: 1.19 u[IU]/mL (ref 0.35–4.50)

## 2019-09-03 ENCOUNTER — Ambulatory Visit: Payer: BC Managed Care – PPO | Attending: Internal Medicine

## 2019-09-03 DIAGNOSIS — Z23 Encounter for immunization: Secondary | ICD-10-CM | POA: Insufficient documentation

## 2019-09-03 NOTE — Progress Notes (Signed)
   Covid-19 Vaccination Clinic  Name:  Alvin Saracco    MRN: [id number] DOB: December 18, 1950  09/03/2019  Mr. Ernesta Amble was observed post Covid-19 immunization for 15 minutes without incidence. He was provided with Vaccine Information Sheet and instruction to access the V-Safe system.   Mr. Ernesta Amble was instructed to call 911 with any severe reactions post vaccine: Marland Kitchen Difficulty breathing  . Swelling of your face and throat  . A fast heartbeat  . A bad rash all over your body  . Dizziness and weakness    Immunizations Administered    Name Date Dose VIS Date Route   Pfizer COVID-19 Vaccine 09/03/2019  3:23 PM 0.3 mL 06/16/2019 Intramuscular   Manufacturer: [place]   Lot: [id number]   [place]: [id number]

## 2019-09-15 ENCOUNTER — Encounter: Payer: Self-pay | Admitting: Gastroenterology

## 2019-10-03 ENCOUNTER — Ambulatory Visit: Payer: BC Managed Care – PPO | Attending: Internal Medicine

## 2019-10-03 DIAGNOSIS — Z23 Encounter for immunization: Secondary | ICD-10-CM

## 2019-10-03 NOTE — Progress Notes (Signed)
   Covid-19 Vaccination Clinic  Name:  Paul Phillips    MRN: [id number] DOB: 1951-03-28  10/03/2019  [name] was observed post Covid-19 immunization for 15 minutes without incident. He was provided with Vaccine Information Sheet and instruction to access the V-Safe system.   [name] was instructed to call 911 with any severe reactions post vaccine: Marland Kitchen Difficulty breathing  . Swelling of face and throat  . A fast heartbeat  . A bad rash all over body  . Dizziness and weakness   Immunizations Administered    Name Date Dose VIS Date Route   Pfizer COVID-19 Vaccine 10/03/2019  2:50 PM 0.3 mL 06/16/2019 Intramuscular   Manufacturer: [place]   Lot: [id number]   [place]: [id number]

## 2019-10-09 ENCOUNTER — Ambulatory Visit (INDEPENDENT_AMBULATORY_CARE_PROVIDER_SITE_OTHER): Payer: BC Managed Care – PPO | Admitting: Family Medicine

## 2019-10-09 ENCOUNTER — Encounter: Payer: Self-pay | Admitting: Family Medicine

## 2019-10-09 ENCOUNTER — Other Ambulatory Visit: Payer: Self-pay

## 2019-10-09 DIAGNOSIS — R42 Dizziness and giddiness: Secondary | ICD-10-CM | POA: Diagnosis not present

## 2019-10-09 DIAGNOSIS — R110 Nausea: Secondary | ICD-10-CM | POA: Diagnosis not present

## 2019-10-09 DIAGNOSIS — E860 Dehydration: Secondary | ICD-10-CM | POA: Diagnosis not present

## 2019-10-09 NOTE — Progress Notes (Signed)
Virtual Visit via Video Note  I connected with pt on 10/09/19 at  3:30 PM EDT by a video enabled telemedicine application and verified that I am speaking with the correct person using two identifiers.  Location patient: home Location provider:work or home office Persons participating in the virtual visit: patient, provider  I discussed the limitations of evaluation and management by telemedicine and the availability of in person appointments. The patient expressed understanding and agreed to proceed.  Telemedicine visit is a necessity given the COVID-19 restrictions in place at the current time.  HPI: 69 y/o WM being seen today for "nausea and lightheaded". Tried to distinguish between lightheaded feeling vs actual vertigo. Some dizziness this morning upon getting out of bed--describes lightheaded feeling (but question of some mild spinning sensation once in middle of night when he got up to go to the bathroom).  Seems to be precipitated more by going from sitting to standing than by turning of head. Some nausea but no vomiting.  Describes some lightheaded feeling when goes from sitting to standing but as the day has gone on it has essentially resolved.  PO intake fairly good today->soup and sandwich.  Drinking lots of water since getting up today.  Historically he is not a good water drinker at all.  Beer--drank 4 yesterday, which is normal for him.  No signif coffe.  Drinks a glass or two of soda daily, also some diet green tea at work.      Got his 2nd covid vaccine 6 d/a, felt fine after this vaccine until this morning. Some nasal allergies but no PND or facial pain.  No ST, cough, or body aches.  No diarrhea. Has no home bp cuff.  He did take his bp med today.  ROS: no CP, no SOB, no wheezing, no HAs, no rashes, no melena/hematochezia.  No polyuria or polydipsia.  No myalgias or arthralgias.  No focal weakness, paresthesias, or tremors.  No acute vision or hearing abnormalities. No abd  pain.  No palpitations.     Past Medical History:  Diagnosis Date  . Arthritis   . Dislocated thumb   . GERD (gastroesophageal reflux disease)   . Heart murmur     Past Surgical History:  Procedure Laterality Date  . NO PAST SURGERIES      Family History  Problem Relation Age of Onset  . Arthritis Mother   . Diabetes Mother   . Hearing loss Mother   . Stroke Mother   . Hearing loss Father   . Heart disease Father   . AAA (abdominal aortic aneurysm) Father   . Aneurysm Sister 76       brain, died from aneurysm suddenly.   [name] attack Brother   . Arthritis/Rheumatoid Maternal Grandmother   . Brain cancer Maternal Grandfather   . Colon cancer Neg Hx     SOCIAL HX:  Social History   Socioeconomic History  . Marital status: Married    Spouse name: Perrin Smack  . Number of children: 1  . Years of education: 26  . Highest education level: Not on file  Occupational History  . Occupation: Clinical biochemist  Tobacco Use  . Smoking status: Former Smoker    Packs/day: 1.00    Years: 30.00    Pack years: 30.00    Types: Cigarettes    Quit date: 07/07/1999    Years since quitting: 20.2  . Smokeless tobacco: Never Used  Substance and Sexual Activity  . Alcohol use: Yes  Alcohol/week: 10.0 standard drinks    Types: 10 Cans of beer per week  . Drug use: No  . Sexual activity: Yes    Partners: Female    Comment: married  Other Topics Concern  . Not on file  Social History Narrative   Married to [place]. 1 adult child [place].   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    Former smoker, quit 2001   Drinks caffeine, takes a daily vitamin.   Wear seatbelt. Smoke detector in the home. Firearms in the home.   safe in his relationships.   Social Determinants of Health   Financial Resource Strain:   . Difficulty of Paying Living Expenses:   Food Insecurity:   . Worried About Charity fundraiser in the Last Year:   . Arboriculturist in the Last Year:   Transportation  Needs:   . Film/video editor (Medical):   Marland Kitchen Lack of Transportation (Non-Medical):   Physical Activity:   . Days of Exercise per Week:   . Minutes of Exercise per Session:   Stress:   . Feeling of Stress :   Social Connections:   . Frequency of Communication with Friends and Family:   . Frequency of Social Gatherings with Friends and Family:   . Attends Religious Services:   . Active Member of Clubs or Organizations:   . Attends Archivist Meetings:   Marland Kitchen Marital Status:       Current Outpatient Medications:  .  Biotin 10 MG TABS, Take by mouth., Disp: , Rfl:  .  diclofenac (VOLTAREN) 75 MG EC tablet, Take 1 tablet (75 mg total) by mouth 2 (two) times daily., Disp: 180 tablet, Rfl: 1 .  lisinopril (ZESTRIL) 10 MG tablet, Take 1 tablet (10 mg total) by mouth daily., Disp: 90 tablet, Rfl: 1 .  vitamin B-12 (CYANOCOBALAMIN) 1000 MCG tablet, Take 1,000 mcg by mouth daily., Disp: , Rfl:  .  aspirin EC 81 MG tablet, Take 81 mg by mouth daily., Disp: , Rfl:   EXAM:  VITALS per patient if applicable: There were no vitals taken for this visit.   GENERAL: alert, oriented, appears well and in no acute distress  HEENT: atraumatic, conjunttiva clear, no obvious abnormalities on inspection of external nose and ears  NECK: normal movements of the head and neck  LUNGS: on inspection no signs of respiratory distress, breathing rate appears normal, no obvious gross SOB, gasping or wheezing  CV: no obvious cyanosis  MS: moves all visible extremities without noticeable abnormality  PSYCH/NEURO: pleasant and cooperative, no obvious depression or anxiety, speech and thought processing grossly intact  LABS:  None today.    Chemistry      Component Value Date/Time   NA 136 06/26/2019 1517   K 4.1 06/26/2019 1517   CL 109 06/26/2019 1517   CO2 20 06/26/2019 1517   BUN 24 (H) 06/26/2019 1517   CREATININE 0.97 06/26/2019 1517   CREATININE 1.04 06/22/2018 1516       Component Value Date/Time   CALCIUM 9.6 06/26/2019 1517   ALKPHOS 51 06/26/2019 1517   AST 14 06/26/2019 1517   ALT 18 06/26/2019 1517   BILITOT 0.7 06/26/2019 1517     Lab Results  Component Value Date   HGBA1C 5.2 06/26/2019   ASSESSMENT AND PLAN:  Discussed the following assessment and plan:  Acute dizziness with nausea secondary to some likely mild dehydration. Tough to completely r/o an episode of positional vertigo in  middle of the night last night, but since then he describes only disequilibrium sx's intermittently. No sign of acute infectious illness. He essentially feels back to baseline state of health now. Encouraged minimization of beer intake, intake of caffeinated drinks, continue increased intake of clear fluids now and on a regular basis.   I discussed the assessment and treatment plan with the patient. The patient was provided an opportunity to ask questions and all were answered. The patient agreed with the plan and demonstrated an understanding of the instructions.   The patient was advised to call back or seek an in-person evaluation if the symptoms worsen or if the condition fails to improve as anticipated.  F/u: if not improving  Signed:  Crissie Sickles, MD           10/09/2019

## 2019-10-10 ENCOUNTER — Other Ambulatory Visit: Payer: Self-pay

## 2019-10-10 ENCOUNTER — Ambulatory Visit (AMBULATORY_SURGERY_CENTER): Payer: Self-pay

## 2019-10-10 VITALS — Temp 98.1°F | Ht 66.0 in | Wt 219.0 lb

## 2019-10-10 DIAGNOSIS — Z86010 Personal history of colonic polyps: Secondary | ICD-10-CM

## 2019-10-10 MED ORDER — PLENVU 140 G PO SOLR: 1.0000 | Freq: Once | ORAL | 0 refills | Status: AC

## 2019-10-10 NOTE — Progress Notes (Signed)
No egg or soy allergy known to patient  No issues with past sedation with any surgeries  or procedures, no intubation problems  No diet pills per patient No home 02 use per patient  No blood thinners per patient  Pt denies issues with constipation  No A fib or A flutter  EMMI video sent to pt's e mail   Startup ON 10/03/19 PFIZER  Due to the COVID-19 pandemic we are asking patients to follow these guidelines. Please only bring one care partner. Please be aware that your care partner may wait in the car in the parking lot or if they feel like they will be too hot to wait in the car, they may wait in the lobby on the 4th floor. All care partners are required to wear a mask the entire time (we do not have any that we can provide them), they need to practice social distancing, and we will do a Covid check for all patient's and care partners when you arrive. Also we will check their temperature and your temperature. If the care partner waits in their car they need to stay in the parking lot the entire time and we will call them on their cell phone when the patient is ready for discharge so they can bring the car to the front of the building. Also all patient's will need to wear a mask into building.

## 2019-10-20 ENCOUNTER — Encounter: Payer: Self-pay | Admitting: Gastroenterology

## 2019-10-23 ENCOUNTER — Encounter: Payer: Self-pay | Admitting: Gastroenterology

## 2019-10-23 ENCOUNTER — Other Ambulatory Visit: Payer: Self-pay

## 2019-10-23 ENCOUNTER — Ambulatory Visit (AMBULATORY_SURGERY_CENTER): Payer: BC Managed Care – PPO | Admitting: Gastroenterology

## 2019-10-23 VITALS — BP 108/57 | HR 54 | Temp 97.1°F | Resp 11 | Ht 66.0 in | Wt 219.0 lb

## 2019-10-23 DIAGNOSIS — Z1211 Encounter for screening for malignant neoplasm of colon: Secondary | ICD-10-CM | POA: Diagnosis not present

## 2019-10-23 DIAGNOSIS — Z86010 Personal history of colonic polyps: Secondary | ICD-10-CM | POA: Diagnosis not present

## 2019-10-23 MED ORDER — SODIUM CHLORIDE 0.9 % IV SOLN: 500.0000 mL | Freq: Once | INTRAVENOUS | Status: DC

## 2019-10-23 NOTE — Progress Notes (Signed)
pt tolerated well. VSS. awake and to recovery. Report given to RN.  

## 2019-10-23 NOTE — Patient Instructions (Signed)
Handouts provided on diverticulosis, hemorrhoids and high-fiber diet.   YOU HAD AN ENDOSCOPIC PROCEDURE TODAY AT [facility]:   Refer to the procedure report that was given to you for any specific questions about what was found during the examination.  If the procedure report does not answer your questions, please call your gastroenterologist to clarify.  If you requested that your care partner not be given the details of your procedure findings, then the procedure report has been included in a sealed envelope for you to review at your convenience later.  YOU SHOULD EXPECT: Some feelings of bloating in the abdomen. Passage of more gas than usual.  Walking can help get rid of the air that was put into your GI tract during the procedure and reduce the bloating. If you had a lower endoscopy (such as a colonoscopy or flexible sigmoidoscopy) you may notice spotting of blood in your stool or on the toilet paper. If you underwent a bowel prep for your procedure, you may not have a normal bowel movement for a few days.  Please Note:  You might notice some irritation and congestion in your nose or some drainage.  This is from the oxygen used during your procedure.  There is no need for concern and it should clear up in a day or so.  SYMPTOMS TO REPORT IMMEDIATELY:   Following lower endoscopy (colonoscopy or flexible sigmoidoscopy):  Excessive amounts of blood in the stool  Significant tenderness or worsening of abdominal pains  Swelling of the abdomen that is new, acute  Fever of 100F or higher   For urgent or emergent issues, a gastroenterologist can be reached at any hour by calling [phone]. Do not use MyChart messaging for urgent concerns.    DIET:  We do recommend a small meal at first, but then you may proceed to your regular diet.  Drink plenty of fluids but you should avoid alcoholic beverages for 24 hours.  ACTIVITY:  You should plan to take it easy for the rest of  today and you should NOT DRIVE or use heavy machinery until tomorrow (because of the sedation medicines used during the test).    FOLLOW UP: Our staff will call the number listed on your records 48-72 hours following your procedure to check on you and address any questions or concerns that you may have regarding the information given to you following your procedure. If we do not reach you, we will leave a message.  We will attempt to reach you two times.  During this call, we will ask if you have developed any symptoms of COVID 19. If you develop any symptoms (ie: fever, flu-like symptoms, shortness of breath, cough etc.) before then, please call [phone].  If you test positive for Covid 19 in the 2 weeks post procedure, please call and report this information to [place].    If any biopsies were taken you will be contacted by phone or by letter within the next 1-3 weeks.  Please call us at [phone] if you have not heard about the biopsies in 3 weeks.    SIGNATURES/CONFIDENTIALITY: You and/or your care partner have signed paperwork which will be entered into your electronic medical record.  These signatures attest to the fact that that the information above on your After Visit Summary has been reviewed and is understood.  Full responsibility of the confidentiality of this discharge information lies with you and/or your care-partner.

## 2019-10-23 NOTE — Progress Notes (Signed)
Temp JB V/S CW 

## 2019-10-23 NOTE — Op Note (Signed)
[facility] Patient Name: Paul Phillips Procedure Date: 10/23/2019 11:37 AM MRN: [id number] Endoscopist: Ladene Artist , MD Age: 69 Referring MD:  Date of Birth: [birth year] Gender: Male Account #: [id number] Procedure:                Colonoscopy Indications:              Surveillance: Personal history of adenomatous                            polyps on last colonoscopy 3 years ago Medicines:                Monitored Anesthesia Care Procedure:                Pre-Anesthesia Assessment:                           - Prior to the procedure, a History and Physical                            was performed, and patient medications and                            allergies were reviewed. The patient's tolerance of                            previous anesthesia was also reviewed. The risks                            and benefits of the procedure and the sedation                            options and risks were discussed with the patient.                            All questions were answered, and informed consent                            was obtained. Prior Anticoagulants: The patient has                            taken no previous anticoagulant or antiplatelet                            agents. ASA Grade Assessment: II - A patient with                            mild systemic disease. After reviewing the risks                            and benefits, the patient was deemed in                            satisfactory condition to undergo the procedure.  After obtaining informed consent, the colonoscope                            was passed under direct vision. Throughout the                            procedure, the patient's blood pressure, pulse, and                            oxygen saturations were monitored continuously. The                            Colonoscope was introduced through the anus and                            advanced to the the  cecum, identified by                            appendiceal orifice and ileocecal valve. The                            ileocecal valve, appendiceal orifice, and rectum                            were photographed. The quality of the bowel                            preparation was good. The colonoscopy was performed                            without difficulty. The patient tolerated the                            procedure well. Scope In: 11:48:37 AM Scope Out: 11:58:56 AM Scope Withdrawal Time: 0 hours 9 minutes 1 second  Total Procedure Duration: 0 hours 10 minutes 19 seconds  Findings:                 The perianal and digital rectal examinations were                            normal.                           Multiple small-mouthed diverticula were found in                            the left colon. There was no evidence of                            diverticular bleeding.                           Internal hemorrhoids were found during  retroflexion. The hemorrhoids were small and Grade                            I (internal hemorrhoids that do not prolapse).                           The exam was otherwise without abnormality on                            direct and retroflexion views. Complications:            No immediate complications. Estimated blood loss:                            None. Estimated Blood Loss:     Estimated blood loss: none. Impression:               - Mild diverticulosis in the left colon.                           - Internal hemorrhoids.                           - The examination was otherwise normal on direct                            and retroflexion views.                           - No specimens collected. Recommendation:           - Repeat colonoscopy in 5 years for surveillance.                           - Patient has a contact number available for                            emergencies. The signs and symptoms of  potential                            delayed complications were discussed with the                            patient. Return to normal activities tomorrow.                            Written discharge instructions were provided to the                            patient.                           - High fiber diet.                           - Continue present medications. Ladene Artist, MD 10/23/2019 12:04:20 PM This report has been signed electronically.

## 2019-10-25 ENCOUNTER — Telehealth: Payer: Self-pay

## 2019-10-25 NOTE — Telephone Encounter (Signed)
  Follow up Call-  Call back number 10/23/2019  Post procedure Call Back phone  # [phone]  Permission to leave phone message Yes  Some recent data might be hidden     Patient questions:   Do you have a fever, pain , or abdominal swelling? No. Pain Score  0 *  Have you tolerated food without any problems? Yes.    Have you been able to return to your normal activities? Yes.    Do you have any questions about your discharge instructions: Diet   No. Medications  No. Follow up visit  No.  Do you have questions or concerns about your Care? No.  Actions: * If pain score is 4 or above: No action needed, pain <4.  Have you developed a fever since your procedure? no 2.   Have you had an respiratory symptoms (SOB or cough) since your procedure? no 3.   Have you tested positive for COVID 19 since your procedure no 4.   Have you had any family members/close contacts diagnosed with the COVID 19 since your procedure?  no   If yes to any of these questions please route to Joylene John, RN and Erenest Rasher, RN

## 2020-01-23 ENCOUNTER — Telehealth: Payer: Self-pay

## 2020-01-23 ENCOUNTER — Other Ambulatory Visit: Payer: Self-pay

## 2020-01-23 MED ORDER — LISINOPRIL 10 MG PO TABS: 10.0000 mg | ORAL_TABLET | Freq: Every day | ORAL | 0 refills | Status: DC

## 2020-01-23 NOTE — Telephone Encounter (Signed)
1 refill called in with message to call for app

## 2020-01-23 NOTE — Telephone Encounter (Signed)
Patient refill request  lisinopril (ZESTRIL) 10 MG tablet [[id number]]    [facility] [address]

## 2020-03-27 ENCOUNTER — Other Ambulatory Visit: Payer: Self-pay

## 2020-03-27 ENCOUNTER — Telehealth: Payer: Self-pay

## 2020-03-27 MED ORDER — DICLOFENAC SODIUM 75 MG PO TBEC: 75.0000 mg | DELAYED_RELEASE_TABLET | Freq: Two times a day (BID) | ORAL | 0 refills | Status: DC

## 2020-03-27 NOTE — Telephone Encounter (Signed)
Medication filled to last until appt. Further refills can be given at time of appt.

## 2020-03-27 NOTE — Telephone Encounter (Signed)
Patient requests refill of diclofenac (VOLTAREN) 75 MG EC tablet sent to [facility]

## 2020-04-01 ENCOUNTER — Other Ambulatory Visit: Payer: Self-pay

## 2020-04-02 ENCOUNTER — Encounter: Payer: Self-pay | Admitting: Family Medicine

## 2020-04-02 ENCOUNTER — Ambulatory Visit: Payer: BC Managed Care – PPO | Admitting: Family Medicine

## 2020-04-02 VITALS — BP 143/58 | HR 70 | Temp 98.8°F | Ht 64.5 in | Wt 231.0 lb

## 2020-04-02 DIAGNOSIS — M1990 Unspecified osteoarthritis, unspecified site: Secondary | ICD-10-CM | POA: Diagnosis not present

## 2020-04-02 DIAGNOSIS — E781 Pure hyperglyceridemia: Secondary | ICD-10-CM

## 2020-04-02 DIAGNOSIS — D696 Thrombocytopenia, unspecified: Secondary | ICD-10-CM | POA: Diagnosis not present

## 2020-04-02 DIAGNOSIS — I10 Essential (primary) hypertension: Secondary | ICD-10-CM | POA: Diagnosis not present

## 2020-04-02 MED ORDER — DICLOFENAC SODIUM 75 MG PO TBEC
75.0000 mg | DELAYED_RELEASE_TABLET | Freq: Two times a day (BID) | ORAL | 1 refills | Status: DC
Start: 2020-04-02 — End: 2020-07-15

## 2020-04-02 MED ORDER — LISINOPRIL 10 MG PO TABS
15.0000 mg | ORAL_TABLET | Freq: Every day | ORAL | 1 refills | Status: DC
Start: 2020-04-02 — End: 2020-07-15

## 2020-04-02 NOTE — Patient Instructions (Addendum)
Schedule Physical Dec. 22 to March 1.  Monitor BP- Goal is less than 135 on the top number. We increased your lisinopril to 1.5 tabs daily. If still above goal call in and we will increase again for you and follow up closely.

## 2020-04-02 NOTE — Progress Notes (Signed)
This visit occurred during the SARS-CoV-2 public health emergency.  Safety protocols were in place, including screening questions prior to the visit, additional usage of staff PPE, and extensive cleaning of exam room while observing appropriate contact time as indicated for disinfecting solutions.    Patient ID: Paul Phillips, male  DOB: April 01, 1951, 69 y.o.   MRN: [id number] Patient Care Team    Relationship Specialty Notifications Start End  Ma Hillock, DO PCP - General Family Medicine  06/19/16   Orbie Hurst, MD Referring Physician Dermatology  12/21/18     Chief Complaint  Patient presents with  . Follow-up    [facility]    Subjective:Paul Phillips for cmc HTN/HLD/Morbid obesity/thrombocytopenia: Pt reports compliance  with  lisnopril 10 mg QD.Patient denies chest pain, shortness of breath, dizziness or lower extremity edema. He states the lowest he has seen for his BP was 132/67- most have been over 140.  Pt takes a daily baby ASA. Pt is not prescribed statin-he refused.  He is taking fish oil supplementation since his last visit. Labs UTD 06/2019 Diet: Started watching his diet very closely. Exercise: Is exercising routinely.   RF: Hypertension, hypertriglyceridemia, former smoker, strong family history of heart disease, stroke and aneurysms  Arthritis:  Pt reports voltaren is working well for him.    Depression screen [facility] 2/9 06/26/2019 12/21/2018 12/10/2017 07/07/2017 07/03/2016  Decreased Interest 0 0 0 0 0  Down, Depressed, Hopeless 0 0 - 0 0  PHQ - 2 Score 0 0 0 0 0   No flowsheet data found.       Fall Risk  04/02/2020 12/21/2018 12/10/2017 07/03/2016 06/19/2016  Falls in the past year? 0 0 No No No  Number falls in past yr: 0 - - - -  Injury with Fall? 0 - - - -  Follow up Falls evaluation completed - - - -      Immunization History  Administered Date(s) Administered  . Hepatitis B, ped/adol 09/22/2013  .  Influenza-Unspecified 04/05/2017, 04/26/2018, 03/07/2019  . PFIZER SARS-COV-2 Vaccination 09/03/2019, 10/03/2019  . Pneumococcal Conjugate-13 07/03/2016  . Pneumococcal Polysaccharide-23 06/22/2018  . Tdap 07/03/2016     Past Medical History:  Diagnosis Date  . Arthritis   . Dislocated thumb   . GERD (gastroesophageal reflux disease)   . Heart murmur   . Hypertension    No Known Allergies Past Surgical History:  Procedure Laterality Date  . COLONOSCOPY  10/26/2016   TA   Family History  Problem Relation Age of Onset  . Arthritis Mother   . Diabetes Mother   . Hearing loss Mother   . Stroke Mother   . Hearing loss Father   . Heart disease Father   . AAA (abdominal aortic aneurysm) Father   . Aneurysm Sister 44       brain, died from aneurysm suddenly.   [name] attack Brother   . Arthritis/Rheumatoid Maternal Grandmother   . Brain cancer Maternal Grandfather   . Colon cancer Neg Hx   . Colon polyps Neg Hx   . Esophageal cancer Neg Hx   . Stomach cancer Neg Hx   . Rectal cancer Neg Hx    Social History   Social History Narrative   Married to [place]. 1 adult child [place].   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    Former smoker, quit 2001   Drinks caffeine, takes a daily vitamin.   Wear seatbelt.  Smoke detector in the home. Firearms in the home.   safe in his relationships.    Allergies as of 04/02/2020   No Known Allergies     Medication List       Accurate as of April 02, 2020  2:39 PM. If you have any questions, ask your nurse or doctor.        STOP taking these medications   omeprazole 20 MG tablet Commonly known as: PRILOSEC OTC Stopped by: Howard Pouch, DO     TAKE these medications   aspirin EC 81 MG tablet Take 81 mg by mouth daily.   Biotin 10 MG Tabs Take by mouth.   diclofenac 75 MG EC tablet Commonly known as: VOLTAREN Take 1 tablet (75 mg total) by mouth 2 (two) times daily.   lisinopril 10 MG tablet Commonly known  as: ZESTRIL Take 1 tablet (10 mg total) by mouth daily.   Mens 50+ Multi Vitamin/Min Tabs Take 1 tablet by mouth daily.   Plenvu 140 g Solr Generic drug: PEG-KCl-NaCl-NaSulf-Na Asc-C TAKE BY MOUTH AS A ONE TIME DOSE   vitamin B-12 1000 MCG tablet Commonly known as: CYANOCOBALAMIN Take 2,500 mcg by mouth daily.   ZANTAC 360 PO Take by mouth.      All past medical history, surgical history, allergies, family history, immunizations andmedications were updated in the EMR today and reviewed under the history and medication portions of their EMR.     No results found for this or any previous visit (from the past 2160 hour(s)).  US ABDOMINAL AORTA SCREENING AAA  Result Date: 03/21/2018 CLINICAL DATA:  Screening, previous tobacco abuse EXAM: ULTRASOUND OF ABDOMINAL AORTA TECHNIQUE: Ultrasound examination of the abdominal aorta was performed to evaluate for abdominal aortic aneurysm. COMPARISON:  None. FINDINGS: Abdominal aortic measurements as follows: Proximal:  2.6 x 2.5 cm Mid:  2.3 x 2.3 cm Distal:  2.1 x 2.5 cm IMPRESSION: Ectatic abdominal aorta at risk for aneurysm development. Recommend followup by ultrasound in 5 years. This recommendation follows ACR consensus guidelines: White Paper of the ACR Incidental Findings Committee II on Vascular Findings. J Am Coll Radiol 2013; 10:789-794. Electronically Signed   By: Lucrezia Europe M.D.   On: 03/21/2018 08:34     ROS: 14 pt review of systems performed and negative (unless mentioned in an HPI)  Objective: BP (!) 143/58   Pulse 70   Temp 98.8 F (37.1 C) (Oral)   Ht 5' 4.5" (1.638 m)   Wt 231 lb (104.8 kg)   SpO2 97%   BMI 39.04 kg/m  Gen: Afebrile. No acute distress. Nontoxic pleasant male. Obese.  HENT: AT. [place].  Eyes:Pupils Equal Round Reactive to light, Extraocular movements intact,  Conjunctiva without redness, discharge or icterus. CV: RRR no murmur, no edema Chest: CTAB, no wheeze or crackles Neuro: Normal gait. PERLA. EOMi.  Alert. Oriented x3 Psych: Normal affect, dress and demeanor. Normal speech. Normal thought content and judgment.    No exam data Phillips  Assessment/plan: Paul Phillips is a 69 y.o. male Phillips for CPE Hypertension/obesity/Statin declinedFamily history of abdominal aortic aneurysm (AAA)/hypertriglycerides:  - mildly above goal and repeated x2.  Increase Lisinopril 10 mg> to 15 mg  Low sodium diet.  Routine exercise.  Labs due next appt.  - AAA screen q 5 yrs due 03/2023 - f/u 5.5 mos. Or CPE (whichever first)   Arthritis:  Stable.  Continue Voltaren. Plts have been stable on medication.   No follow-ups on file.  No orders of the defined types were placed in this encounter.    Note is dictated utilizing voice recognition software. Although note has been proof read prior to signing, occasional typographical errors still can be missed. If any questions arise, please do not hesitate to call for verification.  Electronically signed by: Howard Pouch, DO [place]

## 2020-07-02 ENCOUNTER — Encounter: Payer: BC Managed Care – PPO | Admitting: Family Medicine

## 2020-07-15 ENCOUNTER — Ambulatory Visit (INDEPENDENT_AMBULATORY_CARE_PROVIDER_SITE_OTHER): Payer: BC Managed Care – PPO | Admitting: Family Medicine

## 2020-07-15 ENCOUNTER — Encounter: Payer: Self-pay | Admitting: Family Medicine

## 2020-07-15 ENCOUNTER — Other Ambulatory Visit: Payer: Self-pay

## 2020-07-15 VITALS — BP 125/63 | HR 77 | Temp 98.4°F | Ht 64.75 in | Wt 224.0 lb

## 2020-07-15 DIAGNOSIS — Z23 Encounter for immunization: Secondary | ICD-10-CM

## 2020-07-15 DIAGNOSIS — M1990 Unspecified osteoarthritis, unspecified site: Secondary | ICD-10-CM

## 2020-07-15 DIAGNOSIS — Z0000 Encounter for general adult medical examination without abnormal findings: Secondary | ICD-10-CM

## 2020-07-15 DIAGNOSIS — Z125 Encounter for screening for malignant neoplasm of prostate: Secondary | ICD-10-CM | POA: Diagnosis not present

## 2020-07-15 DIAGNOSIS — D696 Thrombocytopenia, unspecified: Secondary | ICD-10-CM | POA: Diagnosis not present

## 2020-07-15 DIAGNOSIS — E781 Pure hyperglyceridemia: Secondary | ICD-10-CM | POA: Diagnosis not present

## 2020-07-15 DIAGNOSIS — Z131 Encounter for screening for diabetes mellitus: Secondary | ICD-10-CM | POA: Diagnosis not present

## 2020-07-15 DIAGNOSIS — I10 Essential (primary) hypertension: Secondary | ICD-10-CM | POA: Diagnosis not present

## 2020-07-15 DIAGNOSIS — Z5320 Procedure and treatment not carried out because of patient's decision for unspecified reasons: Secondary | ICD-10-CM

## 2020-07-15 MED ORDER — DICLOFENAC SODIUM 75 MG PO TBEC
75.0000 mg | DELAYED_RELEASE_TABLET | Freq: Two times a day (BID) | ORAL | 1 refills | Status: DC
Start: 2020-07-15 — End: 2020-09-16

## 2020-07-15 MED ORDER — LISINOPRIL 10 MG PO TABS
15.0000 mg | ORAL_TABLET | Freq: Every day | ORAL | 1 refills | Status: DC
Start: 2020-07-15 — End: 2020-09-16

## 2020-07-15 MED ORDER — ZOSTER VAC RECOMB ADJUVANTED 50 MCG/0.5ML IM SUSR: 0.5000 mL | Freq: Once | INTRAMUSCULAR | 0 refills | Status: AC

## 2020-07-15 NOTE — Patient Instructions (Signed)
I have refilled your meds for you today. Next appt in 5.5 mos  I printed your shingrix #2 injection. If your medicare kicks in when this is due- use this script to take to pharmacy to receive second dose.    Health Maintenance After Age 68 After age 38, you are at a higher risk for certain long-term diseases and infections as well as injuries from falls. Falls are a major cause of broken bones and head injuries in people who are older than age 24. Getting regular preventive care can help to keep you healthy and well. Preventive care includes getting regular testing and making lifestyle changes as recommended by your health care provider. Talk with your health care provider about:  Which screenings and tests you should have. A screening is a test that checks for a disease when you have no symptoms.  A diet and exercise plan that is right for you. What should I know about screenings and tests to prevent falls? Screening and testing are the best ways to find a health problem early. Early diagnosis and treatment give you the best chance of managing medical conditions that are common after age 92. Certain conditions and lifestyle choices may make you more likely to have a fall. Your health care provider may recommend:  Regular vision checks. Poor vision and conditions such as cataracts can make you more likely to have a fall. If you wear glasses, make sure to get your prescription updated if your vision changes.  Medicine review. Work with your health care provider to regularly review all of the medicines you are taking, including over-the-counter medicines. Ask your health care provider about any side effects that may make you more likely to have a fall. Tell your health care provider if any medicines that you take make you feel dizzy or sleepy.  Osteoporosis screening. Osteoporosis is a condition that causes the bones to get weaker. This can make the bones weak and cause them to break more  easily.  Blood pressure screening. Blood pressure changes and medicines to control blood pressure can make you feel dizzy.  Strength and balance checks. Your health care provider may recommend certain tests to check your strength and balance while standing, walking, or changing positions.  Foot health exam. Foot pain and numbness, as well as not wearing proper footwear, can make you more likely to have a fall.  Depression screening. You may be more likely to have a fall if you have a fear of falling, feel emotionally low, or feel unable to do activities that you used to do.  Alcohol use screening. Using too much alcohol can affect your balance and may make you more likely to have a fall. What actions can I take to lower my risk of falls? General instructions  Talk with your health care provider about your risks for falling. Tell your health care provider if: ? You fall. Be sure to tell your health care provider about all falls, even ones that seem minor. ? You feel dizzy, sleepy, or off-balance.  Take over-the-counter and prescription medicines only as told by your health care provider. These include any supplements.  Eat a healthy diet and maintain a healthy weight. A healthy diet includes low-fat dairy products, low-fat (lean) meats, and fiber from whole grains, beans, and lots of fruits and vegetables. Home safety  Remove any tripping hazards, such as rugs, cords, and clutter.  Install safety equipment such as grab bars in bathrooms and safety rails on stairs.  Keep rooms and walkways well-lit. Activity  Follow a regular exercise program to stay fit. This will help you maintain your balance. Ask your health care provider what types of exercise are appropriate for you.  If you need a cane or walker, use it as recommended by your health care provider.  Wear supportive shoes that have nonskid soles.   Lifestyle  Do not drink alcohol if your health care provider tells you not to  drink.  If you drink alcohol, limit how much you have: ? 0-1 drink a day for women. ? 0-2 drinks a day for men.  Be aware of how much alcohol is in your drink. In the [place] one drink equals one typical bottle of beer (12 oz), one-half glass of wine (5 oz), or one shot of hard liquor (1 oz).  Do not use any products that contain nicotine or tobacco, such as cigarettes and e-cigarettes. If you need help quitting, ask your health care provider. Summary  Having a healthy lifestyle and getting preventive care can help to protect your health and wellness after age 12.  Screening and testing are the best way to find a health problem early and help you avoid having a fall. Early diagnosis and treatment give you the best chance for managing medical conditions that are more common for people who are older than age 34.  Falls are a major cause of broken bones and head injuries in people who are older than age 53. Take precautions to prevent a fall at home.  Work with your health care provider to learn what changes you can make to improve your health and wellness and to prevent falls. This information is not intended to replace advice given to you by your health care provider. Make sure you discuss any questions you have with your health care provider. Document Revised: 10/13/2018 Document Reviewed: 05/05/2017 Elsevier Patient Education  2021 [employer].

## 2020-07-15 NOTE — Progress Notes (Signed)
This visit occurred during the SARS-CoV-2 public health emergency.  Safety protocols were in place, including screening questions prior to the visit, additional usage of staff PPE, and extensive cleaning of exam room while observing appropriate contact time as indicated for disinfecting solutions.    Patient ID: Paul Phillips, male  DOB: 05-Dec-1950, 70 y.o.   MRN: [id number] Patient Care Team    Relationship Specialty Notifications Start End  Ma Hillock, DO PCP - General Family Medicine  06/19/16   Orbie Hurst, MD Referring Physician Dermatology  12/21/18     Chief Complaint  Patient presents with  . Annual Exam    Pt is fasting    Subjective:  Paul Phillips is a 70 y.o. male present for CPE. All past medical history, surgical history, allergies, family history, immunizations, medications and social history were updated in the electronic medical record today. All recent labs, ED visits and hospitalizations within the last year were reviewed.  Health maintenance:  Colonoscopy:Completed 2021, by Dr. Fuller Plan. Multiple polyps, 5 year follow-up recommended. Referral placed Immunizations: tdap UTD 2017, influenza UTD 2021,PNA series UTD,covid series completed with booster,  shingrixx1 today. Nurse visit for #2 2-5 mos Infectious disease screening: HIV and Hep C negative. PSA:  Lab Results  Component Value Date   PSA 0.79 06/26/2019   PSA 1.2 07/07/2017   PSA 1.39 07/01/2016  , pt was counseled on prostate cancer screenings.  Assistive device: none Oxygen [id number] Patient has a Dental home. Hospitalizations/ED visits: reviewed   HTN/HLD/Morbid obesity/thrombocytopenia: Pt reports compliance with lisnopril 15 mg QD. Patient denies chest pain, shortness of breath, dizziness or lower extremity edema. Pt takes a daily baby ASA. Pt is not prescribed statin-he refused. He is taking fish oil supplementation since his last visit. Diet: Started watching his  diet very closely. Exercise: Is exercising routinely.  RF: Hypertension, hypertriglyceridemia, former smoker, strong family history of heart disease, stroke and aneurysms  Arthritis:  Pt reports voltaren is working great for him.    Depression screen [facility] 2/9 07/15/2020 06/26/2019 12/21/2018 12/10/2017 07/07/2017  Decreased Interest 0 0 0 0 0  Down, Depressed, Hopeless 0 0 0 - 0  PHQ - 2 Score 0 0 0 0 0   No flowsheet data found.       Fall Risk  07/15/2020 04/02/2020 12/21/2018 12/10/2017 07/03/2016  Falls in the past year? 0 0 0 No No  Number falls in past yr: 0 0 - - -  Injury with Fall? 0 0 - - -  Follow up - Falls evaluation completed - - -      Immunization History  Administered Date(s) Administered  . Hepatitis B, ped/adol 09/22/2013  . Influenza-Unspecified 04/05/2017, 04/26/2018, 03/07/2019, 04/05/2020  . PFIZER SARS-COV-2 Vaccination 09/03/2019, 10/03/2019, 06/24/2020  . Pneumococcal Conjugate-13 07/03/2016  . Pneumococcal Polysaccharide-23 06/22/2018  . Tdap 07/03/2016  . Zoster Recombinat (Shingrix) 07/15/2020     Past Medical History:  Diagnosis Date  . Arthritis   . Dislocated thumb   . GERD (gastroesophageal reflux disease)   . Heart murmur   . Hypertension    No Known Allergies Past Surgical History:  Procedure Laterality Date  . COLONOSCOPY  10/26/2016   TA   Family History  Problem Relation Age of Onset  . Arthritis Mother   . Diabetes Mother   . Hearing loss Mother   . Stroke Mother   . Hearing loss Father   . Heart disease Father   . AAA (abdominal aortic  aneurysm) Father   . Aneurysm Sister 64       brain, died from aneurysm suddenly.   [name] attack Brother   . Arthritis/Rheumatoid Maternal Grandmother   . Brain cancer Maternal Grandfather   . Colon cancer Neg Hx   . Colon polyps Neg Hx   . Esophageal cancer Neg Hx   . Stomach cancer Neg Hx   . Rectal cancer Neg Hx    Social History   Social History Narrative   Married to  [place]. 1 adult child [place].   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    Former smoker, quit 2001   Drinks caffeine, takes a daily vitamin.   Wear seatbelt. Smoke detector in the home. Firearms in the home.   safe in his relationships.    Allergies as of 07/15/2020   No Known Allergies     Medication List       Accurate as of July 15, 2020  4:12 PM. If you have any questions, ask your nurse or doctor.        STOP taking these medications   Plenvu 140 g Solr Generic drug: PEG-KCl-NaCl-NaSulf-Na Asc-C Stopped by: Howard Pouch, DO     TAKE these medications   aspirin EC 81 MG tablet Take 81 mg by mouth daily.   Biotin 10 MG Tabs Take by mouth.   diclofenac 75 MG EC tablet Commonly known as: VOLTAREN Take 1 tablet (75 mg total) by mouth 2 (two) times daily.   lisinopril 10 MG tablet Commonly known as: ZESTRIL Take 1.5 tablets (15 mg total) by mouth daily.   Mens 50+ Multi Vitamin/Min Tabs Take 1 tablet by mouth daily.   vitamin B-12 1000 MCG tablet Commonly known as: CYANOCOBALAMIN Take 2,500 mcg by mouth daily.   ZANTAC 360 PO Take by mouth.   Zoster Vaccine Adjuvanted injection Commonly known as: SHINGRIX Inject 0.5 mLs into the muscle once for 1 dose. Started by: Howard Pouch, DO      All past medical history, surgical history, allergies, family history, immunizations andmedications were updated in the EMR today and reviewed under the history and medication portions of their EMR.     No results found for this or any previous visit (from the past 2160 hour(s)).  US ABDOMINAL AORTA SCREENING AAA  Result Date: 03/21/2018 CLINICAL DATA:  Screening, previous tobacco abuse EXAM: ULTRASOUND OF ABDOMINAL AORTA TECHNIQUE: Ultrasound examination of the abdominal aorta was performed to evaluate for abdominal aortic aneurysm. COMPARISON:  None. FINDINGS: Abdominal aortic measurements as follows: Proximal:  2.6 x 2.5 cm Mid:  2.3 x 2.3 cm Distal:  2.1 x 2.5  cm IMPRESSION: Ectatic abdominal aorta at risk for aneurysm development. Recommend followup by ultrasound in 5 years. This recommendation follows ACR consensus guidelines: White Paper of the ACR Incidental Findings Committee II on Vascular Findings. J Am Coll Radiol 2013; 10:789-794. Electronically Signed   By: Lucrezia Europe M.D.   On: 03/21/2018 08:34     ROS: 14 pt review of systems performed and negative (unless mentioned in an HPI)  Objective: BP 125/63   Pulse 77   Temp 98.4 F (36.9 C) (Oral)   Ht 5' 4.75" (1.645 m)   Wt 224 lb (101.6 kg)   SpO2 97%   BMI 37.56 kg/m  Gen: Afebrile. No acute distress. Nontoxic in appearance, well-developed, well-nourished, pleasant, obese male HENT: AT. [place]. Bilateral TM visualized and normal in appearance, normal external auditory canal. MMM, no oral lesions, adequate dentition. Bilateral  nares within normal limits-mild erythema no swelling or drainage. Throat without erythema, ulcerations or exudates.  No cough on exam, no hoarseness on exam. Eyes:Pupils Equal Round Reactive to light, Extraocular movements intact,  Conjunctiva without redness, discharge or icterus. Neck/lymp/endocrine: Supple, no lymphadenopathy, no thyromegaly CV: RRR no murmur, no edema, +2/4 P posterior tibialis pulses.  Chest: CTAB, no wheeze, rhonchi or crackles.  Normal respiratory effort.  Good air movement. Abd: Soft.  Flat. NTND. BS present.  No masses palpated. No hepatosplenomegaly. No rebound tenderness or guarding. Skin: No rashes, purpura or petechiae. Warm and well-perfused. Skin intact. Neuro/Msk: Normal gait. PERLA. EOMi. Alert. Oriented x3.  Cranial nerves II through XII intact. Muscle strength 5/5 upper/lower extremity. DTRs equal bilaterally. Psych: Normal affect, dress and demeanor. Normal speech. Normal thought content and judgment.  No exam data present  Assessment/plan: Alexandro Eltz is a 70 y.o. male present for CPE Hypertension/obesity/Statin  declinedFamily history of abdominal aortic aneurysm (AAA)/hypertriglycerides:  - stable.  Continue  Lisinopril 15 mg  Low sodium diet.  Routine exercise.  Cbc, cmp, tsh, lipids collected today - AAA screen q 5 yrs due 03/2023 - f/u 5.5 mos. Or CPE (whichever first)   Arthritis:  stable Continue Voltaren. Plts have been stable on medication  Thrombocytopenia (HCC) BC collected today, has been stable on Voltaren  Prostate cancer screening - PSA  Diabetes mellitus screening - Hemoglobin A1c  Encounter for preventative medical exam: Patient was encouraged to exercise greater than 150 minutes a week. Patient was encouraged to choose a diet filled with fresh fruits and vegetables, and lean meats. AVS provided to patient today for education/recommendation on gender specific health and safety maintenance. Colonoscopy:Completed 2021, by Dr. Fuller Plan. Multiple polyps, 5 year follow-up recommended. Referral placed Immunizations: tdap UTD 2017, influenza UTD 2021,PNA series UTD,covid series completed with booster,  shingrixx1 today. Nurse visit for #2 2-5 mos (printed prescription as well for Shingrix No. 2 if his Medicare kicks in before then he is aware he will need to take that to the pharmacy for Shingrix No. 2.) Infectious disease screening: HIV and Hep C negative.  Return in about 24 weeks (around 12/30/2020) for [facility] (30 min).   Orders Placed This Encounter  Procedures  . Varicella-zoster vaccine IM  . CBC with Differential/Platelet  . Comprehensive metabolic panel  . Hemoglobin A1c  . TSH  . PSA  . Lipid panel   Meds ordered this encounter  Medications  . Zoster Vaccine Adjuvanted [facility]) injection    Sig: Inject 0.5 mLs into the muscle once for 1 dose.    Dispense:  0.5 mL    Refill:  0  . diclofenac (VOLTAREN) 75 MG EC tablet    Sig: Take 1 tablet (75 mg total) by mouth 2 (two) times daily.    Dispense:  180 tablet    Refill:  1    Please do not send request for  renewal or a year supply. If pt needs refills more than prescribed,it is time for patients follow up with the provider and they should call the office for an appt. Thanks  . lisinopril (ZESTRIL) 10 MG tablet    Sig: Take 1.5 tablets (15 mg total) by mouth daily.    Dispense:  135 tablet    Refill:  1    Please do not send request for renewal or a year supply. If pt needs refills more than prescribed,it is time for patients follow up with the provider and they should call the  office for an appt. Thanks   Referral Orders  No referral(s) requested today     Note is dictated utilizing voice recognition software. Although note has been proof read prior to signing, occasional typographical errors still can be missed. If any questions arise, please do not hesitate to call for verification.  Electronically signed by: Howard Pouch, DO [place]

## 2020-07-16 ENCOUNTER — Encounter: Payer: Self-pay | Admitting: Family Medicine

## 2020-07-16 ENCOUNTER — Telehealth: Payer: Self-pay | Admitting: Family Medicine

## 2020-07-16 DIAGNOSIS — D696 Thrombocytopenia, unspecified: Secondary | ICD-10-CM

## 2020-07-16 LAB — TSH: TSH: 1.31 mIU/L (ref 0.40–4.50)

## 2020-07-16 LAB — HEMOGLOBIN A1C
Hgb A1c MFr Bld: 5.1 % of total Hgb (ref <5.7)
Mean Plasma Glucose: 100 mg/dL
eAG (mmol/L): 5.5 mmol/L

## 2020-07-16 LAB — CBC WITH DIFFERENTIAL/PLATELET
Absolute Monocytes: 818 cells/uL (ref 200–950)
Basophils Absolute: 42 cells/uL (ref 0–200)
Basophils Relative: 0.9 %
Eosinophils Absolute: 113 cells/uL (ref 15–500)
Eosinophils Relative: 2.4 %
HCT: 45.9 % (ref 38.5–50.0)
Hemoglobin: 15.9 g/dL (ref 13.2–17.1)
Lymphs Abs: 1203 cells/uL (ref 850–3900)
MCH: 30.8 pg (ref 27.0–33.0)
MCHC: 34.6 g/dL (ref 32.0–36.0)
MCV: 89 fL (ref 80.0–100.0)
MPV: 11.4 fL (ref 7.5–12.5)
Monocytes Relative: 17.4 %
Neutro Abs: 2524 cells/uL (ref 1500–7800)
Neutrophils Relative %: 53.7 %
Platelets: 56 10*3/uL — ABNORMAL LOW (ref 140–400)
RBC: 5.16 10*6/uL (ref 4.20–5.80)
RDW: 12.7 % (ref 11.0–15.0)
Total Lymphocyte: 25.6 %
WBC: 4.7 10*3/uL (ref 3.8–10.8)

## 2020-07-16 LAB — COMPREHENSIVE METABOLIC PANEL
AG Ratio: 1.9 (calc) (ref 1.0–2.5)
ALT: 27 U/L (ref 9–46)
AST: 18 U/L (ref 10–35)
Albumin: 4.3 g/dL (ref 3.6–5.1)
Alkaline phosphatase (APISO): 54 U/L (ref 35–144)
BUN: 24 mg/dL (ref 7–25)
CO2: 22 mmol/L (ref 20–32)
Calcium: 9.5 mg/dL (ref 8.6–10.3)
Chloride: 109 mmol/L (ref 98–110)
Creat: 1.18 mg/dL (ref 0.70–1.25)
Globulin: 2.3 g/dL (calc) (ref 1.9–3.7)
Glucose, Bld: 87 mg/dL (ref 65–99)
Potassium: 4.7 mmol/L (ref 3.5–5.3)
Sodium: 139 mmol/L (ref 135–146)
Total Bilirubin: 0.5 mg/dL (ref 0.2–1.2)
Total Protein: 6.6 g/dL (ref 6.1–8.1)

## 2020-07-16 LAB — LIPID PANEL
Cholesterol: 189 mg/dL (ref <200)
HDL: 45 mg/dL (ref >=40)
LDL Cholesterol (Calc): 120 mg/dL (calc) — ABNORMAL HIGH
Non-HDL Cholesterol (Calc): 144 mg/dL (calc) — ABNORMAL HIGH (ref <130)
Total CHOL/HDL Ratio: 4.2 (calc) (ref <5.0)
Triglycerides: 128 mg/dL (ref <150)

## 2020-07-16 LAB — PSA: PSA: 0.93 ng/mL (ref <=4.0)

## 2020-07-16 NOTE — Telephone Encounter (Signed)
Left patient detailed message per DPR  

## 2020-07-16 NOTE — Telephone Encounter (Signed)
Please call patient Liver, kidney and thyroid function are normal Diabetes screening/A1c is normal at 5.1 PSA- prostate cancer screen in normal range  Blood cell counts and electrolytes are normal- with the exception of his platelets. These cells are responsible for clotting/stopping bleeding. His are VERY low at 56. This is new for him and getting at a dangerous level for increased bleeding.     - RECS: Avoid/stop use of  fish oil supplements, alcohol use, aspirin, any advil/motrin/ibuprophen.    - His platelets were stable prior,  despite diclofenac use which can lower, but I would suggest he at least cut back to once a day of diclofenac until we get a better understanding of why his platelets dropped so drastically. We may have to come off this med altogether.   Cholesterol panel higher than goal of LDL < 100, with LDL 120. He has declined a statin in the past- he would benefit from statin use. If still declining could consider medication called zetia (not a statin) that helps lower LDL but does not provide the added cardiovascular protection statin meds will. Please advise of his decision.   I have referred him to blood specialist for further evaluation on his low platelets.  He should monitor for any increased  Bleeding and be seen urgently if noticing blood in stool/tarry stools.

## 2020-07-17 NOTE — Telephone Encounter (Signed)
LVM for pt to CB regarding results.  

## 2020-07-18 ENCOUNTER — Telehealth: Payer: Self-pay | Admitting: Family Medicine

## 2020-07-18 NOTE — Telephone Encounter (Signed)
LVM for pt to CB regarding results.  

## 2020-07-18 NOTE — Telephone Encounter (Signed)
Spoke with pt regarding labs and instructions.   

## 2020-07-18 NOTE — Telephone Encounter (Signed)
Patient returned call for lab results. Please call patient to advise. Verified best number to reach patient [phone].

## 2020-07-18 NOTE — Telephone Encounter (Signed)
Letter printed.

## 2020-07-18 NOTE — Telephone Encounter (Signed)
Pt declines all rx at this time

## 2020-07-18 NOTE — Telephone Encounter (Signed)
See other encounter.

## 2020-07-24 ENCOUNTER — Other Ambulatory Visit: Payer: Self-pay | Admitting: Family

## 2020-07-24 DIAGNOSIS — D696 Thrombocytopenia, unspecified: Secondary | ICD-10-CM

## 2020-07-25 ENCOUNTER — Other Ambulatory Visit: Payer: Self-pay

## 2020-07-25 ENCOUNTER — Inpatient Hospital Stay (HOSPITAL_BASED_OUTPATIENT_CLINIC_OR_DEPARTMENT_OTHER): Payer: BC Managed Care – PPO | Admitting: Family

## 2020-07-25 ENCOUNTER — Telehealth: Payer: Self-pay

## 2020-07-25 ENCOUNTER — Inpatient Hospital Stay: Payer: BC Managed Care – PPO | Attending: Hematology & Oncology

## 2020-07-25 ENCOUNTER — Encounter: Payer: Self-pay | Admitting: Family

## 2020-07-25 VITALS — BP 156/52 | HR 72 | Ht 64.0 in | Wt 229.0 lb

## 2020-07-25 DIAGNOSIS — D696 Thrombocytopenia, unspecified: Secondary | ICD-10-CM | POA: Insufficient documentation

## 2020-07-25 DIAGNOSIS — Z87891 Personal history of nicotine dependence: Secondary | ICD-10-CM | POA: Insufficient documentation

## 2020-07-25 DIAGNOSIS — Z7289 Other problems related to lifestyle: Secondary | ICD-10-CM

## 2020-07-25 LAB — CBC WITH DIFFERENTIAL (CANCER CENTER ONLY)
Abs Immature Granulocytes: 0.02 10*3/uL (ref 0.00–0.07)
Basophils Absolute: 0 10*3/uL (ref 0.0–0.1)
Basophils Relative: 1 %
Eosinophils Absolute: 0.1 10*3/uL (ref 0.0–0.5)
Eosinophils Relative: 1 %
HCT: 45.8 % (ref 39.0–52.0)
Hemoglobin: 15.6 g/dL (ref 13.0–17.0)
Immature Granulocytes: 0 %
Lymphocytes Relative: 33 %
Lymphs Abs: 1.8 10*3/uL (ref 0.7–4.0)
MCH: 29.9 pg (ref 26.0–34.0)
MCHC: 34.1 g/dL (ref 30.0–36.0)
MCV: 87.9 fL (ref 80.0–100.0)
Monocytes Absolute: 0.6 10*3/uL (ref 0.1–1.0)
Monocytes Relative: 11 %
Neutro Abs: 2.8 10*3/uL (ref 1.7–7.7)
Neutrophils Relative %: 54 %
Platelet Count: 141 10*3/uL — ABNORMAL LOW (ref 150–400)
RBC: 5.21 MIL/uL (ref 4.22–5.81)
RDW: 13.2 % (ref 11.5–15.5)
WBC Count: 5.3 10*3/uL (ref 4.0–10.5)
nRBC: 0 % (ref 0.0–0.2)

## 2020-07-25 LAB — CMP (CANCER CENTER ONLY)
ALT: 20 U/L (ref 0–44)
AST: 19 U/L (ref 15–41)
Albumin: 4.2 g/dL (ref 3.5–5.0)
Alkaline Phosphatase: 49 U/L (ref 38–126)
Anion gap: 6 (ref 5–15)
BUN: 17 mg/dL (ref 8–23)
CO2: 25 mmol/L (ref 22–32)
Calcium: 10.3 mg/dL (ref 8.9–10.3)
Chloride: 106 mmol/L (ref 98–111)
Creatinine: 1.08 mg/dL (ref 0.61–1.24)
GFR, Estimated: >60 mL/min (ref >60)
Glucose, Bld: 87 mg/dL (ref 70–99)
Potassium: 4.4 mmol/L (ref 3.5–5.1)
Sodium: 137 mmol/L (ref 135–145)
Total Bilirubin: 0.7 mg/dL (ref 0.3–1.2)
Total Protein: 6.9 g/dL (ref 6.5–8.1)

## 2020-07-25 LAB — LACTATE DEHYDROGENASE: LDH: 154 U/L (ref 98–192)

## 2020-07-25 LAB — PLATELET BY CITRATE

## 2020-07-25 LAB — SAVE SMEAR(SSMR), FOR PROVIDER SLIDE REVIEW

## 2020-07-25 NOTE — Telephone Encounter (Signed)
No f/u needed per 1-20 los   Jaylon Grode

## 2020-07-25 NOTE — Progress Notes (Signed)
Hematology/Oncology Consultation   Name: Paul Phillips      MRN: [id number]    Location: Room/bed info not found  Date: 07/25/2020 Time:1:39 PM   REFERRING PHYSICIAN: Howard Pouch, DO  REASON FOR CONSULT: Thrombocytopenia    DIAGNOSIS: Intermittent thrombocytopenia   HISTORY OF PRESENT ILLNESS: [name] is a pleasant 70 yo caucasian gentleman with at least a 4 year history of mild thrombocytopenia (124-130). His platelets dipped down to 56 on 07/15/2020. He had not been this low before.  He denies any issues with blood loss. No abnormal bruising, no petechiae.  He notes some SOB with over exertion and states that he rarely has to stop and take a break to rest.  No family history of thrombocytopenia or other myelodysplasia that he is aware up.  No family history of cancer.  He has history of skin cancer, possibly basal cell. Denies history of melanoma.  No diabetes or thyroid disease.  He had his colonoscopy in April 2021. This showed multiple smal mouthed diverticula in the left colon, no evidence of bleeding. He had internal hemorrhoids that were described as small and Grade I.  He states that he will sometimes wake himself up gasping/snoring but has never had a sleep study to diagnose sleep apnea.  No fever, chills, n/v, cough, rash, dizziness, SOB, chest pain, palpitations, abdominal pain or changes in bowel or bladder habits.   He takes a stool softener as needed for constipation.  He has swelling and tenderness at times in the right hands due to arthritis. + No numbness or tingling in his extremities.  No falls or syncope.  He quite smoking (2 ppd) 20 years ago.  He does enjoy a couple beers and/or mixed drinks on the weekend.  No recreational drug use.  He has maintained a good appetite and is staying well hydrated. His weight is stable at 229 lbs.  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   PAST MEDICAL HISTORY:   Past Medical History:  Diagnosis Date  . Arthritis   . Dislocated thumb   . GERD (gastroesophageal reflux disease)   . Heart murmur   . Hypertension     ALLERGIES: No Known Allergies    MEDICATIONS:  Current Outpatient Medications on File Prior to Visit  Medication Sig Dispense Refill  . diclofenac (VOLTAREN) 75 MG EC tablet Take 1 tablet (75 mg total) by mouth 2 (two) times daily. 180 tablet 1  . Famotidine (ZANTAC 360 PO) Take by mouth.    Marland Kitchen lisinopril (ZESTRIL) 10 MG tablet Take 1.5 tablets (15 mg total) by mouth daily. 135 tablet 1  . Multiple Vitamins-Minerals (MENS 50+ MULTI VITAMIN/MIN) TABS Take 1 tablet by mouth daily.    . vitamin B-12 (CYANOCOBALAMIN) 1000 MCG tablet Take 2,500 mcg by mouth daily.      No current facility-administered medications on file prior to visit.     PAST SURGICAL HISTORY Past Surgical History:  Procedure Laterality Date  . COLONOSCOPY  10/26/2016   TA    FAMILY HISTORY: Family History  Problem Relation Age of Onset  . Arthritis Mother   . Diabetes Mother   . Hearing loss Mother   . Stroke Mother   . Hearing loss Father   . Heart disease Father   . AAA (abdominal aortic aneurysm) Father   . Aneurysm Sister 75       brain, died  from aneurysm suddenly.   [name] attack Brother   . Arthritis/Rheumatoid Maternal Grandmother   . Brain cancer Maternal Grandfather   . Colon cancer Neg Hx   . Colon polyps Neg Hx   . Esophageal cancer Neg Hx   . Stomach cancer Neg Hx   . Rectal cancer Neg Hx     SOCIAL HISTORY:  reports that he quit smoking about 21 years ago. His smoking use included cigarettes. He has a 30.00 pack-year smoking history. He has never used smokeless tobacco. He reports current alcohol use of about 4.0 standard drinks of alcohol per week. He reports that he does not use drugs.  PERFORMANCE STATUS: The patient's performance status is 0 - Asymptomatic  PHYSICAL EXAM: Most Recent  Vital Signs: There were no vitals taken for this visit. BP (!) 156/52 (BP Location: Right Arm, Patient Position: Sitting)   Pulse 72   Ht 5\' 4"  (1.626 m)   Wt 229 lb (103.9 kg)   BMI 39.31 kg/m   General Appearance:    Alert, cooperative, no distress, appears stated age  Head:    Normocephalic, without obvious abnormality, atraumatic  Eyes:    PERRL, conjunctiva/corneas clear, EOM's intact, fundi    benign, both eyes             Throat:   Lips, mucosa, and tongue normal; teeth and gums normal  Neck:   Supple, symmetrical, trachea midline, no adenopathy;       thyroid:  No enlargement/tenderness/nodules; no carotid   bruit or JVD  Back:     Symmetric, no curvature, ROM normal, no CVA tenderness  Lungs:     Clear to auscultation bilaterally, respirations unlabored  Chest wall:    No tenderness or deformity  Heart:    Regular rate and rhythm, S1 and S2 normal, no murmur, rub   or gallop  Abdomen:     Soft, non-tender, bowel sounds active all four quadrants,    no masses, no organomegaly        Extremities:   Extremities normal, atraumatic, no cyanosis or edema  Pulses:   2+ and symmetric all extremities  Skin:   Skin color, texture, turgor normal, no rashes or lesions  Lymph nodes:   Cervical, supraclavicular, and axillary nodes normal  Neurologic:   CNII-XII intact. Normal strength, sensation and reflexes      throughout    LABORATORY DATA:  Results for orders placed or performed in visit on 07/25/20 (from the past 48 hour(s))  CBC with Differential ([place] Only)     Status: Abnormal   Collection Time: 07/25/20  1:20 PM  Result Value Ref Range   WBC Count 5.3 4.0 - 10.5 K/uL   RBC 5.21 4.22 - 5.81 MIL/uL   Hemoglobin 15.6 13.0 - 17.0 g/dL   HCT 45.8 39.0 - 52.0 %   MCV 87.9 80.0 - 100.0 fL   MCH 29.9 26.0 - 34.0 pg   MCHC 34.1 30.0 - 36.0 g/dL   RDW 13.2 11.5 - 15.5 %   Platelet Count 141 (L) 150 - 400 K/uL    Comment: EDTA platelet count consistent with citrate.    nRBC 0.0 0.0 - 0.2 %   Neutrophils Relative % 54 %   Neutro Abs 2.8 1.7 - 7.7 K/uL   Lymphocytes Relative 33 %   Lymphs Abs 1.8 0.7 - 4.0 K/uL   Monocytes Relative 11 %   Monocytes Absolute 0.6 0.1 - 1.0 K/uL   Eosinophils Relative  1 %   Eosinophils Absolute 0.1 0.0 - 0.5 K/uL   Basophils Relative 1 %   Basophils Absolute 0.0 0.0 - 0.1 K/uL   Immature Granulocytes 0 %   Abs Immature Granulocytes 0.02 0.00 - 0.07 K/uL    Comment: Performed at [facility] Lab at [facility], [address] [place], [place] [address]  Save Smear [facility])     Status: None   Collection Time: 07/25/20  1:20 PM  Result Value Ref Range   Smear Review SMEAR STAINED AND AVAILABLE FOR REVIEW     Comment: Performed at [facility] at [facility], [address] [place], [place] [address]  Platelet by [facility]     Status: None   Collection Time: 07/25/20  1:20 PM  Result Value Ref Range   Platelet CT in [facility] consistent with PLTS     Comment: Performed at [facility] at [facility], [address] [place], [place] [address]      RADIOGRAPHY: No results found.     PATHOLOGY: None   ASSESSMENT/PLAN: [name] is a pleasant 70 yo caucasian gentleman with at least a 4 year history of mild thrombocytopenia.  Platelets today are 141, Hgb 15.6, WBC count 5.3.  His blood smear was reviewed by MD and no evidence of malignancy was noted.  His platelets were clumped which can cause a lower read out on the machine. He was advised to request manual readouts on CBC is platelets read low again.  At this time we will release him back to his PCP.   All questions were answered and he is in agreement. He was encouraged to contact our office with any questions or concerns. We can certainly see him again for any future heme/onc issues he might have.   The patient was discussed with Dr. Marin Olp and he is in agreement with the aforementioned.    Laverna Peace, NP

## 2020-09-16 ENCOUNTER — Other Ambulatory Visit: Payer: Self-pay

## 2020-09-16 ENCOUNTER — Encounter: Payer: Self-pay | Admitting: Family Medicine

## 2020-09-16 ENCOUNTER — Ambulatory Visit (INDEPENDENT_AMBULATORY_CARE_PROVIDER_SITE_OTHER): Payer: BC Managed Care – PPO | Admitting: Family Medicine

## 2020-09-16 VITALS — BP 115/61 | HR 72 | Temp 98.5°F | Ht 64.0 in | Wt 226.0 lb

## 2020-09-16 DIAGNOSIS — I10 Essential (primary) hypertension: Secondary | ICD-10-CM | POA: Diagnosis not present

## 2020-09-16 DIAGNOSIS — E669 Obesity, unspecified: Secondary | ICD-10-CM | POA: Diagnosis not present

## 2020-09-16 DIAGNOSIS — Z8249 Family history of ischemic heart disease and other diseases of the circulatory system: Secondary | ICD-10-CM | POA: Diagnosis not present

## 2020-09-16 DIAGNOSIS — E781 Pure hyperglyceridemia: Secondary | ICD-10-CM | POA: Diagnosis not present

## 2020-09-16 DIAGNOSIS — Z5320 Procedure and treatment not carried out because of patient's decision for unspecified reasons: Secondary | ICD-10-CM

## 2020-09-16 DIAGNOSIS — M1990 Unspecified osteoarthritis, unspecified site: Secondary | ICD-10-CM

## 2020-09-16 MED ORDER — ZOSTER VAC RECOMB ADJUVANTED 50 MCG/0.5ML IM SUSR: 0.5000 mL | Freq: Once | INTRAMUSCULAR | 0 refills | Status: AC

## 2020-09-16 MED ORDER — DICLOFENAC SODIUM 75 MG PO TBEC: 75.0000 mg | DELAYED_RELEASE_TABLET | Freq: Two times a day (BID) | ORAL | 1 refills | Status: DC

## 2020-09-16 MED ORDER — LISINOPRIL 10 MG PO TABS: 15.0000 mg | ORAL_TABLET | Freq: Every day | ORAL | 1 refills | Status: DC

## 2020-09-16 NOTE — Progress Notes (Signed)
This visit occurred during the SARS-CoV-2 public health emergency.  Safety protocols were in place, including screening questions prior to the visit, additional usage of staff PPE, and extensive cleaning of exam room while observing appropriate contact time as indicated for disinfecting solutions.    Paul Phillips, [place] 1951-05-01, 70 y.o., male MRN: [id number] Patient Care Team    Relationship Specialty Notifications Start End  Ma Hillock, DO PCP - General Family Medicine  06/19/16   Orbie Hurst, MD Referring Physician Dermatology  12/21/18     Chief Complaint  Patient presents with  . Hyperlipidemia    Pt is fasting     Subjective: Pt presents for an OV for cmc/HLD follow up  HTN/HLD/Morbid obesity/thrombocytopenia: Pt reports compliance with lisnopril 15 mg QD. Patient denies chest pain, shortness of breath, dizziness or lower extremity edema.  Pt takes a daily baby ASA. Pt is not prescribed statin-he did not want to start med yet. He wanted a chance to make changes first. He is taking fish oil supplementation since his last visit. Diet: Started watching his diet very closely. Exercise: Is exercising routinely.  RF: Hypertension, hypertriglyceridemia, former smoker, strong family history of heart disease, stroke and aneurysms  Arthritis:  Pt reports voltaren is working great for him  Depression screen [facility] 2/9 07/15/2020 06/26/2019 12/21/2018 12/10/2017 07/07/2017  Decreased Interest 0 0 0 0 0  Down, Depressed, Hopeless 0 0 0 - 0  PHQ - 2 Score 0 0 0 0 0    No Known Allergies Social History   Social History Narrative   Married to [place]. 1 adult child [place].   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    Former smoker, quit 2001   Drinks caffeine, takes a daily vitamin.   Wear seatbelt. Smoke detector in the home. Firearms in the home.   safe in his relationships.   Past Medical History:  Diagnosis Date  . Arthritis   . Dislocated thumb   .  GERD (gastroesophageal reflux disease)   . Heart murmur   . Hypertension    Past Surgical History:  Procedure Laterality Date  . COLONOSCOPY  10/26/2016   TA   Family History  Problem Relation Age of Onset  . Arthritis Mother   . Diabetes Mother   . Hearing loss Mother   . Stroke Mother   . Hearing loss Father   . Heart disease Father   . AAA (abdominal aortic aneurysm) Father   . Aneurysm Sister 55       brain, died from aneurysm suddenly.   [name] attack Brother   . Arthritis/Rheumatoid Maternal Grandmother   . Brain cancer Maternal Grandfather   . Colon cancer Neg Hx   . Colon polyps Neg Hx   . Esophageal cancer Neg Hx   . Stomach cancer Neg Hx   . Rectal cancer Neg Hx    Allergies as of 09/16/2020   No Known Allergies     Medication List       Accurate as of September 16, 2020  3:13 PM. If you have any questions, ask your nurse or doctor.        diclofenac 75 MG EC tablet Commonly known as: VOLTAREN Take 1 tablet (75 mg total) by mouth 2 (two) times daily.   lisinopril 10 MG tablet Commonly known as: ZESTRIL Take 1.5 tablets (15 mg total) by mouth daily.   Mens 50+ Multi Vitamin/Min Tabs Take 1 tablet by mouth daily.  vitamin B-12 1000 MCG tablet Commonly known as: CYANOCOBALAMIN Take 2,500 mcg by mouth daily.   ZANTAC 360 PO Take by mouth.       All past medical history, surgical history, allergies, family history, immunizations andmedications were updated in the EMR today and reviewed under the history and medication portions of their EMR.     ROS: Negative, with the exception of above mentioned in HPI   Objective:  BP 115/61   Pulse 72   Temp 98.5 F (36.9 C) (Oral)   Ht 5\' 4"  (1.626 m)   Wt 226 lb (102.5 kg)   SpO2 97%   BMI 38.79 kg/m  Body mass index is 38.79 kg/m. Gen: Afebrile. No acute distress. Nontoxic in appearance, well developed, well nourished.  HENT: AT. [place].  Eyes:Pupils Equal Round Reactive to light, Extraocular  movements intact,  Conjunctiva without redness, discharge or icterus. Neck/lymp/endocrine: Supple,no lymphadenopathy CV: RRR no murmur, no edema Chest: CTAB, no wheeze or crackles. Good air movement, normal resp effort.  Neuro:  Normal gait. PERLA. EOMi. Alert. Oriented x3 Psych: Normal affect, dress and demeanor. Normal speech. Normal thought content and judgment.  No exam data present No results found. No results found for this or any previous visit (from the past 24 hour(s)).  Assessment/Plan: Sallie Maker is a 70 y.o. male present for OV for  Hypertension/obesity/Statin declinedFamily history of abdominal aortic aneurysm (AAA)/hypertriglycerides:  Stable BP Continue  Lisinopril 15 mg  Low sodium diet.  Routine exercise.   lipids-fasting  collected today> if still elevated above 100 LDL> statin indicated.  - AAA screen q 5 yrs due 03/2023 - f/u5.86mos. Or CPE (whichever first)   Arthritis:  Stable continue Voltaren. Plts have been stable on medication - AAA screen q 5 yrs due 03/2023 - f/u5.82mos. Or CPE (whichever first)     Reviewed expectations re: course of current medical issues.  Discussed self-management of symptoms.  Outlined signs and symptoms indicating need for more acute intervention.  Patient verbalized understanding and all questions were answered.  Patient received an After-Visit Summary.    No orders of the defined types were placed in this encounter.  No orders of the defined types were placed in this encounter.  Referral Orders  No referral(s) requested today     Note is dictated utilizing voice recognition software. Although note has been proof read prior to signing, occasional typographical errors still can be missed. If any questions arise, please do not hesitate to call for verification.   electronically signed by:  Howard Pouch, DO  [place]

## 2020-09-17 DIAGNOSIS — E781 Pure hyperglyceridemia: Secondary | ICD-10-CM | POA: Diagnosis not present

## 2020-09-17 NOTE — Addendum Note (Signed)
Addended by: Octaviano Glow on: 09/17/2020 04:08 PM   Modules accepted: Orders

## 2020-09-18 ENCOUNTER — Telehealth: Payer: Self-pay | Admitting: Family Medicine

## 2020-09-18 LAB — LIPID PANEL
Cholesterol: 168 mg/dL (ref <200)
HDL: 47 mg/dL (ref >=40)
LDL Cholesterol (Calc): 102 mg/dL (calc) — ABNORMAL HIGH
Non-HDL Cholesterol (Calc): 121 mg/dL (calc) (ref <130)
Total CHOL/HDL Ratio: 3.6 (calc) (ref <5.0)
Triglycerides: 101 mg/dL (ref <150)

## 2020-09-18 MED ORDER — PRAVASTATIN SODIUM 20 MG PO TABS: 20.0000 mg | ORAL_TABLET | Freq: Every day | ORAL | 3 refills | Status: DC

## 2020-09-18 MED ORDER — ATORVASTATIN CALCIUM 10 MG PO TABS
10.0000 mg | ORAL_TABLET | Freq: Every day | ORAL | 3 refills | Status: DC
Start: 2020-09-18 — End: 2021-07-21

## 2020-09-18 NOTE — Telephone Encounter (Signed)
Noted  

## 2020-09-18 NOTE — Telephone Encounter (Signed)
[facility] called to inquire about 2 statin medications sent over from our office.  I reviewed previous notes below.  Pravastatin was sent in error.   [facility] aware of error and will only fill Atorvastatin.

## 2020-09-18 NOTE — Telephone Encounter (Signed)
Spoke with pt regarding labs and instructions. Pt agrees with statin. Please send [facility] on file

## 2020-09-18 NOTE — Telephone Encounter (Signed)
Please can pharmacy and cancel the pravastatin called in accidentally.   Let pt know I called atorvastatin for him to take before bed.

## 2020-09-18 NOTE — Addendum Note (Signed)
Addended by: Howard Pouch A on: 09/18/2020 12:08 PM   Modules accepted: Orders

## 2020-09-18 NOTE — Telephone Encounter (Signed)
Please inform patient: He did really well with bringing down his LDL almost 20 points by dietary changes.  His LDL is now 102 which overall is very good. -With his family history of stroke, heart attacks and his history hypertension, he would still benefit at least from a low-dose statin if he is willing to try for the cardiovascular protection.  If he is wanting to add the added cardiovascular protection I will call in a low-dose statin for him to start before bed daily.  Please advise of his decision if he would like to start statin

## 2020-12-30 ENCOUNTER — Other Ambulatory Visit: Payer: Self-pay | Admitting: Family Medicine

## 2020-12-30 ENCOUNTER — Other Ambulatory Visit: Payer: Self-pay

## 2020-12-30 ENCOUNTER — Telehealth: Payer: Self-pay | Admitting: Family Medicine

## 2020-12-30 ENCOUNTER — Encounter: Payer: Self-pay | Admitting: Family Medicine

## 2020-12-30 ENCOUNTER — Ambulatory Visit (INDEPENDENT_AMBULATORY_CARE_PROVIDER_SITE_OTHER): Payer: BC Managed Care – PPO | Admitting: Family Medicine

## 2020-12-30 VITALS — BP 148/62 | HR 90 | Temp 98.1°F | Ht 64.0 in | Wt 227.0 lb

## 2020-12-30 DIAGNOSIS — G510 Bell's palsy: Secondary | ICD-10-CM

## 2020-12-30 DIAGNOSIS — Z6841 Body Mass Index (BMI) 40.0 and over, adult: Secondary | ICD-10-CM

## 2020-12-30 DIAGNOSIS — I10 Essential (primary) hypertension: Secondary | ICD-10-CM

## 2020-12-30 DIAGNOSIS — D696 Thrombocytopenia, unspecified: Secondary | ICD-10-CM | POA: Diagnosis not present

## 2020-12-30 DIAGNOSIS — Z8249 Family history of ischemic heart disease and other diseases of the circulatory system: Secondary | ICD-10-CM

## 2020-12-30 DIAGNOSIS — E781 Pure hyperglyceridemia: Secondary | ICD-10-CM | POA: Diagnosis not present

## 2020-12-30 DIAGNOSIS — M1990 Unspecified osteoarthritis, unspecified site: Secondary | ICD-10-CM

## 2020-12-30 MED ORDER — DICLOFENAC SODIUM 75 MG PO TBEC: 75.0000 mg | DELAYED_RELEASE_TABLET | Freq: Two times a day (BID) | ORAL | 1 refills | Status: DC

## 2020-12-30 MED ORDER — METHYLPREDNISOLONE ACETATE 80 MG/ML IJ SUSP
80.0000 mg | Freq: Once | INTRAMUSCULAR | Status: AC
  Administered 2020-12-30: 80 mg via INTRAMUSCULAR

## 2020-12-30 MED ORDER — ERYTHROMYCIN 5 MG/GM OP OINT: 1.0000 "application " | TOPICAL_OINTMENT | Freq: Every day | OPHTHALMIC | 2 refills | Status: DC

## 2020-12-30 MED ORDER — PREDNISONE 20 MG PO TABS: ORAL_TABLET | ORAL | 0 refills | Status: DC

## 2020-12-30 MED ORDER — LISINOPRIL 20 MG PO TABS: 20.0000 mg | ORAL_TABLET | Freq: Every day | ORAL | 1 refills | Status: DC

## 2020-12-30 MED ORDER — VALACYCLOVIR HCL 1 G PO TABS: 1000.0000 mg | ORAL_TABLET | Freq: Three times a day (TID) | ORAL | 0 refills | Status: AC

## 2020-12-30 MED ORDER — DOXYCYCLINE HYCLATE 100 MG PO TABS: 100.0000 mg | ORAL_TABLET | Freq: Two times a day (BID) | ORAL | 0 refills | Status: DC

## 2020-12-30 NOTE — Telephone Encounter (Signed)
Pt was told at his pharmacy that his erythromycin ophthalmic ointment [[id number]] is on back order. He is wanting another script similar to this one. Please advise pt

## 2020-12-30 NOTE — Patient Instructions (Signed)
Bell's Palsy, Adult  Bell's palsy is a short-term inability to move muscles in a part of the face. The inability to move, also called paralysis, results from inflammation or compression of the seventh cranial nerve. This nerve travels along the skull and under the ear to the side of the face. This nerve is responsible for facialmovements that include blinking, closing the eyes, smiling, and frowning. What are the causes? The exact cause of this condition is not known. It may be caused by an infection from a virus, such as the chickenpox (herpes zoster), Epstein-Barr, or mumps virus. What increases the risk? You are more likely to develop this condition if: You are pregnant. You have diabetes. You have had a recent infection in your nose, throat, or airways. You have a weakened body defense system (immune system). You have had a facial injury, such as a fracture. You have a family history of Bell's palsy. What are the signs or symptoms? Symptoms of this condition include: Weakness on one side of the face. Drooping eyelid and corner of the mouth. Excessive tearing in one eye. Difficulty closing the eyelid. Dry eye. Drooling. Dry mouth. Changes in taste. Change in facial appearance. Pain behind one ear. Ringing in one or both ears. Sensitivity to sound in one ear. Facial twitching. Headache. Impaired speech. Dizziness. Difficulty eating or drinking. Most of the time, only one side of the face is affected. In rare cases, Bell'spalsy may affect the whole face. How is this diagnosed? This condition is diagnosed based on: Your symptoms. Your medical history. A physical exam. You may also have to see health care providers who specialize in disorders of the nerves (neurologist) or diseases and conditions of the eye (ophthalmologist). You may have tests, such as: A test to check for nerve damage (electromyogram). Imaging studies, such as a CT scan or an MRI. Blood tests. How is this  treated? This condition affects every person differently. Sometimes symptoms go away without treatment within a couple weeks. If treatment is needed, it varies from person to person. The goal of treatment is to reduce inflammation and protect the eye from damage. Treatment for Bell's palsy may include: Medicines, such as: Steroids to reduce swelling and inflammation. Antiviral medicines. Pain relievers, including aspirin, acetaminophen, or ibuprofen. Eye drops or ointment to keep your eye moist. Eye protection, if you cannot close your eye. Exercises or massage to regain muscle strength and function (physical therapy). Follow these instructions at home:  Take over-the-counter and prescription medicines only as told by your health care provider. If your eye is affected: Keep your eye moist with eye drops or ointment as told by your health care provider. Follow instructions for eye care and protection as told by your health care provider. Do any physical therapy exercises as told by your health care provider. Keep all follow-up visits. This is important. Contact a health care provider if: You have a fever or chills. Your symptoms do not get better within 2-3 weeks, or your symptoms get worse. Your eye is red, irritated, or painful. You have new symptoms. Get help right away if: You have weakness or numbness in a part of your body other than your face. You have trouble swallowing. You develop neck pain or stiffness. You develop dizziness or shortness of breath. Summary Bell's palsy is a short-term inability to move muscles in a part of the face. The inability to move results from inflammation or compression of the facial nerve. This condition affects every person differently. Sometimes  symptoms go away without treatment within a couple weeks. If treatment is needed, it varies from person to person. The goal of treatment is to reduce inflammation and protect the eye from damage. Contact  your health care provider if your symptoms do not get better within 2-3 weeks, or your symptoms get worse. This information is not intended to replace advice given to you by your health care provider. Make sure you discuss any questions you have with your healthcare provider. Document Revised: 03/21/2020 Document Reviewed: 03/21/2020 Elsevier Patient Education  2022 [employer].

## 2020-12-30 NOTE — Progress Notes (Signed)
This visit occurred during the SARS-CoV-2 public health emergency.  Safety protocols were in place, including screening questions prior to the visit, additional usage of staff PPE, and extensive cleaning of exam room while observing appropriate contact time as indicated for disinfecting solutions.    Paul Phillips, [place] [birth year], 70 y.o., male MRN: [id number] Patient Care Team    Relationship Specialty Notifications Start End  Ma Hillock, DO PCP - General Family Medicine  06/19/16   Orbie Hurst, MD Referring Physician Dermatology  12/21/18     Chief Complaint  Patient presents with   Hypertension    [facility]; pt presents with face droop;  Pt is fasting      Subjective: Pt presents for an OV for cmc/HLD follow up  HTN/HLD/Morbid obesity/thrombocytopenia: Pt reports compliance with  lisnopril 15 mg QD. Patient denies chest pain, shortness of breath, dizziness or lower extremity edema.  Pt takes a daily baby ASA. Pt is tolerating atorvastatin.  He is taking fish oil supplementation since his last visit. Diet: Started watching his diet very closely. Exercise: Is exercising routinely.   RF: Hypertension, hypertriglyceridemia, former smoker, strong family history of heart disease, stroke and aneurysms   Arthritis:  Pt reports diclofenac is helping a great deal with his arthritis discomfort.    Facial droop: Patient reports he noticed an odd sensation on the left side of his face when trying to chew/eat his meal.  Then Thursday morning when he awoke he noticed a left-sided facial droop.  He did not seek any medical attention until coming in for his chronic medical condition appointment today.  He reports he is unable to move the left side of his face, close his eye or chew appropriately on the left side of his mouth.  He denies any weakness in his extremities.  He denies headache or chest pain.  He reports he figured he had Bell's palsy.  He had a coworker who also had Bell's  palsy in the past.  He reports he did find a tick on him about 2 months ago and he felt it probably had been on there for about 3 or 4 days before he was able to remove.  Depression screen [facility] 2/9 07/15/2020 06/26/2019 12/21/2018 12/10/2017 07/07/2017  Decreased Interest 0 0 0 0 0  Down, Depressed, Hopeless 0 0 0 - 0  PHQ - 2 Score 0 0 0 0 0    No Known Allergies Social History   Social History Narrative   Married to [place]. 1 adult child [place].   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    Former smoker, quit 2001   Drinks caffeine, takes a daily vitamin.   Wear seatbelt. Smoke detector in the home. Firearms in the home.   safe in his relationships.   Past Medical History:  Diagnosis Date   Arthritis    Dislocated thumb    GERD (gastroesophageal reflux disease)    Heart murmur    Hypertension    Past Surgical History:  Procedure Laterality Date   COLONOSCOPY  10/26/2016   TA   Family History  Problem Relation Age of Onset   Arthritis Mother    Diabetes Mother    Hearing loss Mother    Stroke Mother    Hearing loss Father    Heart disease Father    AAA (abdominal aortic aneurysm) Father    Aneurysm Sister 11       brain, died from aneurysm suddenly.  Heart attack Brother    Arthritis/Rheumatoid Maternal Grandmother    Brain cancer Maternal Grandfather    Colon cancer Neg Hx    Colon polyps Neg Hx    Esophageal cancer Neg Hx    Stomach cancer Neg Hx    Rectal cancer Neg Hx    Allergies as of 12/30/2020   No Known Allergies      Medication List        Accurate as of December 30, 2020 11:59 PM. If you have any questions, ask your nurse or doctor.          atorvastatin 10 MG tablet Commonly known as: LIPITOR Take 1 tablet (10 mg total) by mouth daily.   diclofenac 75 MG EC tablet Commonly known as: VOLTAREN Take 1 tablet (75 mg total) by mouth 2 (two) times daily.   doxycycline 100 MG tablet Commonly known as: VIBRA-TABS Take 1 tablet (100 mg  total) by mouth 2 (two) times daily. Started by: Howard Pouch, DO   erythromycin ophthalmic ointment Place 1 application into the left eye at bedtime. Started by: Howard Pouch, DO   lisinopril 20 MG tablet Commonly known as: ZESTRIL Take 1 tablet (20 mg total) by mouth daily. What changed:  medication strength how much to take Changed by: Howard Pouch, DO   Mens 50+ Multi Vitamin/Min Tabs Take 1 tablet by mouth daily.   predniSONE 20 MG tablet Commonly known as: DELTASONE 60 mg PO x5 days, then decrease by 1/2 tab daily until tapered off. Started by: Howard Pouch, DO   valACYclovir 1000 MG tablet Commonly known as: VALTREX Take 1 tablet (1,000 mg total) by mouth 3 (three) times daily for 10 days. Started by: Howard Pouch, DO   vitamin B-12 1000 MCG tablet Commonly known as: CYANOCOBALAMIN Take 2,500 mcg by mouth daily.   ZANTAC 360 PO Take by mouth.        All past medical history, surgical history, allergies, family history, immunizations andmedications were updated in the EMR today and reviewed under the history and medication portions of their EMR.     ROS: Negative, with the exception of above mentioned in HPI   Objective:  BP (!) 148/62   Pulse 90   Temp 98.1 F (36.7 C) (Oral)   Ht '5\' 4"'  (1.626 m)   Wt 227 lb (103 kg)   SpO2 97%   BMI 38.96 kg/m  Body mass index is 38.96 kg/m. Gen: Afebrile. No acute distress.  Left-sided facial droop present.  Very pleasant, obese male. HENT: Bilateral TM visualized and normal in appearance. MMM. Bilateral nares Throat without erythema or exudates.  No cough.  No hoarseness. Eyes:Pupils Equal Round Reactive to light, Extraocular movements intact,  Conjunctiva without redness, discharge or icterus. Neck/lymp/endocrine: Supple, no lymphadenopathy, no thyromegaly CV: RRR no murmur, no edema, Chest: CTAB, no wheeze or crackles Skin: No rashes, purpura or petechiae.  Neuro:  Normal gait. PERLA. EOMi. Alert. Oriented  x3,. Cranial nerves II through XII intact. Muscle strength 5/5 bilateral upper and lower extremity.  No pronator drift present.   Left facial droop involving forehead, eye, nasolabial fold and mouth. Psych: Normal affect, dress and demeanor. Normal speech. Normal thought content and judgment.  No results found. No results found. No results found for this or any previous visit (from the past 24 hour(s)).  Assessment/Plan: Ozias Paul Phillips is a 70 y.o. male present for OV for  Hypertension/morbid obesity/Family history of abdominal aortic aneurysm (AAA)/hypertriglycerides:  Mildly elevated blood  pressure reading today. Increase lisinopril to 20 mg daily Continue atorvastatin 10 mg daily Low sodium diet.  Routine exercise.  CMP, CBC, TSH and lipids collected today to evaluate statin start.  DL goal less than 100 desired. - AAA screen q 5 yrs due 03/2023 - f/u 5.5 mos [facility]   Arthritis:  Stable Continue Voltaren. Plts have been stable on medication - f/u 5.5 mos.    Bell's palsy: New problem.  Present approximately 4 days without seeking medical intervention. Possible exposure to tick is endorsed. -Steroid injection and prednisone taper prescribed. -Doxycycline twice daily x14 days initiated.  If Lyme titers are positive would extend course to at least 21 days. -Valtrex 3 times daily x7 days prescribed -Eye care discussed.  Encouraged artificial tears solution throughout the day and ointment nightly. - B. burgdorfi antibodies - Ambulatory referral to Neurology - methylPREDNISolone acetate (DEPO-MEDROL) injection 80 mg Follow-up 2 weeks   Reviewed expectations re: course of current medical issues. Discussed self-management of symptoms. Outlined signs and symptoms indicating need for more acute intervention. Patient verbalized understanding and all questions were answered. Patient received an After-Visit Summary.    Orders Placed This Encounter  Procedures   B. burgdorfi  antibodies   CBC w/Diff   Comp Met (CMET)   TSH   Lipid panel   Ambulatory referral to Neurology    Meds ordered this encounter  Medications   diclofenac (VOLTAREN) 75 MG EC tablet    Sig: Take 1 tablet (75 mg total) by mouth 2 (two) times daily.    Dispense:  180 tablet    Refill:  1    Please do not send request for renewal or a year supply. If pt needs refills more than prescribed,it is time for patients follow up with the provider and they should call the office for an appt. Thanks   lisinopril (ZESTRIL) 20 MG tablet    Sig: Take 1 tablet (20 mg total) by mouth daily.    Dispense:  90 tablet    Refill:  1    Please do not send request for renewal or a year supply. If pt needs refills more than prescribed,it is time for patients follow up with the provider and they should call the office for an appt. Thanks   erythromycin ophthalmic ointment    Sig: Place 1 application into the left eye at bedtime.    Dispense:  3.5 g    Refill:  2   doxycycline (VIBRA-TABS) 100 MG tablet    Sig: Take 1 tablet (100 mg total) by mouth 2 (two) times daily.    Dispense:  28 tablet    Refill:  0   valACYclovir (VALTREX) 1000 MG tablet    Sig: Take 1 tablet (1,000 mg total) by mouth 3 (three) times daily for 10 days.    Dispense:  21 tablet    Refill:  0   predniSONE (DELTASONE) 20 MG tablet    Sig: 60 mg PO x5 days, then decrease by 1/2 tab daily until tapered off.    Dispense:  23 tablet    Refill:  0   methylPREDNISolone acetate (DEPO-MEDROL) injection 80 mg    Referral Orders  Ambulatory referral to Neurology    > 45 Minutes was dedicated to this patient's encounter to address chronic medical conditions, medications and labs as well as new acute facial droop/neurological deficit- face-to-face time with patient and post-visit work- which include documentation and prescribing medications and ordering test .  Note is dictated utilizing voice recognition software. Although note has been  proof read prior to signing, occasional typographical errors still can be missed. If any questions arise, please do not hesitate to call for verification.   electronically signed by:  Howard Pouch, DO  [place]

## 2020-12-31 ENCOUNTER — Telehealth: Payer: Self-pay | Admitting: Family Medicine

## 2020-12-31 ENCOUNTER — Telehealth: Payer: Self-pay

## 2020-12-31 ENCOUNTER — Encounter: Payer: Self-pay | Admitting: Family Medicine

## 2020-12-31 DIAGNOSIS — G510 Bell's palsy: Secondary | ICD-10-CM

## 2020-12-31 HISTORY — DX: Bell's palsy: G51.0

## 2020-12-31 LAB — COMPREHENSIVE METABOLIC PANEL
ALT: 17 U/L (ref 0–53)
AST: 14 U/L (ref 0–37)
Albumin: 4.4 g/dL (ref 3.5–5.2)
Alkaline Phosphatase: 58 U/L (ref 39–117)
BUN: 17 mg/dL (ref 6–23)
CO2: 21 mEq/L (ref 19–32)
Calcium: 10.6 mg/dL — ABNORMAL HIGH (ref 8.4–10.5)
Chloride: 108 mEq/L (ref 96–112)
Creatinine, Ser: 1.02 mg/dL (ref 0.40–1.50)
GFR: 75.87 mL/min (ref >60.00)
Glucose, Bld: 94 mg/dL (ref 70–99)
Potassium: 4.4 mEq/L (ref 3.5–5.1)
Sodium: 138 mEq/L (ref 135–145)
Total Bilirubin: 0.8 mg/dL (ref 0.2–1.2)
Total Protein: 6.8 g/dL (ref 6.0–8.3)

## 2020-12-31 LAB — LIPID PANEL
Cholesterol: 142 mg/dL (ref 0–200)
HDL: 51.2 mg/dL (ref >39.00)
LDL Cholesterol: 67 mg/dL (ref 0–99)
NonHDL: 90.93
Total CHOL/HDL Ratio: 3
Triglycerides: 121 mg/dL (ref 0.0–149.0)
VLDL: 24.2 mg/dL (ref 0.0–40.0)

## 2020-12-31 LAB — B. BURGDORFI ANTIBODIES: B burgdorferi Ab IgG+IgM: <0.9 index

## 2020-12-31 LAB — TSH: TSH: 0.73 u[IU]/mL (ref 0.35–4.50)

## 2020-12-31 NOTE — Telephone Encounter (Signed)
Spoke with pt to receive permission to discuss medications with wife. Pt verbalized consent.

## 2020-12-31 NOTE — Telephone Encounter (Signed)
Spoke wit pt wife in regards to medication questions. Pt wife voice concerns after googling medication and its use. Informed wife that pt is not being treated or tested for any STDs.

## 2020-12-31 NOTE — Telephone Encounter (Signed)
Pt wife called and had concerns about pt being put on valacyclovir, she requested a call back from the nurse. [phone].

## 2020-12-31 NOTE — Telephone Encounter (Signed)
Please advise pt to ask pharmacist to direct him to the OTC artifical tear OINTMENT to use nightly to protect his eye.   Thanks.

## 2020-12-31 NOTE — Telephone Encounter (Signed)
Received call from lab [facility]) stating that pt's platelets were clumped and could not be read. Advised to have pt come do lab draw again. Pt states that the last time this happened, he had to be sent to a specialist. Please advise if you would like to have pt some back for lab visit here.

## 2020-12-31 NOTE — Telephone Encounter (Signed)
Please advise 

## 2021-01-01 ENCOUNTER — Telehealth: Payer: Self-pay | Admitting: Family Medicine

## 2021-01-01 NOTE — Telephone Encounter (Signed)
Notified pt. 

## 2021-01-01 NOTE — Telephone Encounter (Signed)
Patient does not have to come in to have collection redrawn.  Please call lab and ask them to manually read or result as is (if they have not already).  You can let lab know that he was sent to an hematologist last time because of this and when platelets were manually read, there was not any issue/concern with his blood/platelets. Hematology stated it is a technical/mechanical issue from the machines used to process the CBC, and not a concern with his blood/platelets.  Thanks.

## 2021-01-01 NOTE — Telephone Encounter (Signed)
Waiting to speak with lab

## 2021-01-01 NOTE — Telephone Encounter (Signed)
Please inform patient the following information: Liver, kidney, electrolytes and thyroid levels are all within normal range Cholesterol panel looks great and is at goal with an LDL of 67. Lyme titers are negative. Take all the medications as prescribed to him during his appointment.  Lorriane Shire: I have not received the CBC results.  I can see the patient was given the information that he does not have to have recollection of his labs secondary to platelet clumping.  With lab asked to result the rest of his CBC despite clumping as asked?  Also,  I have received multiple requests from the pharmacy to order something to replace the erythromycin ointment.  I had already addressed this on the first request-by having staff call patient and tell him to pick up over-the-counter artificial tears ointment for nighttime use.  He told staff that he already picked up the medication prescribed through the pharmacy for the ointment at night-yet the pharmacy states the erythromycin is on backorder.  Can we confirm that patient either received the erythromycin ointment or he has purchased an ointment of artificial tears to place in his eye before bed to protect his eye?   Thank you.

## 2021-01-01 NOTE — Telephone Encounter (Signed)
Spoke with pt and he already started medication from pharmacy. Pt was informed to continue that medication that was picked up from pharmacy.

## 2021-01-02 ENCOUNTER — Encounter: Payer: Self-pay | Admitting: Physician Assistant

## 2021-01-02 NOTE — Telephone Encounter (Signed)
PCP spoke with lab

## 2021-01-07 ENCOUNTER — Telehealth: Payer: Self-pay

## 2021-01-07 NOTE — Telephone Encounter (Signed)
Script states take for 10 days but was given #21 tabs. Please advise.

## 2021-01-07 NOTE — Telephone Encounter (Signed)
Patient called back to check status of medication refill. I told patient "10 d/s" was printed in error.  The correct days supply is 7 d/s.  Patient completed today, when he took his last pill this morning.  No need to follow up - he has specialist appt tomorrow, and then he will be here on Monday 7/11 for his follow up visit with Dr. Raoul Pitch

## 2021-01-07 NOTE — Telephone Encounter (Signed)
Noted  

## 2021-01-07 NOTE — Telephone Encounter (Signed)
7 days is prescribed.  The "10 days "was a typo.

## 2021-01-07 NOTE — Telephone Encounter (Signed)
Patient refill request --- He took his last pill this morning.  He doesn't understand why he ran out of meds --- he says he has been taking med 3 x daily  valACYclovir (VALTREX) 1000 MG tablet [[id number]]    [place], [place]

## 2021-01-08 ENCOUNTER — Other Ambulatory Visit: Payer: Self-pay

## 2021-01-08 ENCOUNTER — Ambulatory Visit (INDEPENDENT_AMBULATORY_CARE_PROVIDER_SITE_OTHER): Payer: BC Managed Care – PPO | Admitting: Physician Assistant

## 2021-01-08 ENCOUNTER — Encounter: Payer: Self-pay | Admitting: Physician Assistant

## 2021-01-08 VITALS — BP 156/69 | HR 88 | Ht 67.0 in | Wt 227.8 lb

## 2021-01-08 DIAGNOSIS — H04123 Dry eye syndrome of bilateral lacrimal glands: Secondary | ICD-10-CM

## 2021-01-08 DIAGNOSIS — G510 Bell's palsy: Secondary | ICD-10-CM | POA: Diagnosis not present

## 2021-01-08 NOTE — Progress Notes (Addendum)
NEUROLOGY CONSULTATION NOTE  Paul Phillips MRN: [id number] DOB: 1951/01/07  Referring provider: Ma Hillock, DO  Primary care provider: Ma Hillock, DO   Reason for consult:  Bell's Palsy   Assessment/Plan:   Bell's Palsy: Recommend Referral to Physical Therapy for Bell's Palsy Recommend Referral to Ophthalmology for evaluation of corneal abrasion due to Bell's Palsy Continue present therapy of oral meds till its completion as per PCP  Follow up as needed  Subjective:   70 year old right-handed male with a history of hypertension, hyperlipidemia, osteoarthritis, morbid obesity, presenting today for evaluation of Bell's palsy. 2 weeks ago,  the patient noticed weakness on the left side of his face while trying to chew his meal the next morning he woke up  noticing the left facial droop. He also felt that something was in his eye and tried to rub it. He did not seek medical attention till 6 days later waiting till his scheduled PCP appointment.  He described the symptoms as inability to move the left side of his face, close his left eye, or chew appropriately but able to swallow well, without any other neurological findings such as weakness in the extremities or numbness. Denies any headaches, neuralgia, otalgia or chest pain.  He reported finding at tick 2-3 months prior, and felt that probably had been there for about 3 or 4 days before discovering and removing it.  On 12/30/2020 he was diagnosed with Bell's palsy, given a steroid injection and prednisone taper, given doxycycline and Valtrex orally, eyedrops, and B. Burgdorferi Ab  negative for Lyme disease. Denies fevers, chills, night sweats, numbness, tingling, unilateral weakness, alteration of mentation. No other recent infections.He reports  hyperacusis on the L. He denies any new rashes, bleeding or confusion. Reports doing  better now, moving his L eye more, and he is applying artificial tears .Vaccinated for  shingles 2 mo ago (2 shots). No new stresses at work or in his personal life.     PAST MEDICAL HISTORY: Past Medical History:  Diagnosis Date   Arthritis    Dislocated thumb    GERD (gastroesophageal reflux disease)    Heart murmur    Hypertension     PAST SURGICAL HISTORY: Past Surgical History:  Procedure Laterality Date   COLONOSCOPY  10/26/2016   TA    MEDICATIONS: Current Outpatient Medications on File Prior to Visit  Medication Sig Dispense Refill   atorvastatin (LIPITOR) 10 MG tablet Take 1 tablet (10 mg total) by mouth daily. 90 tablet 3   diclofenac (VOLTAREN) 75 MG EC tablet Take 1 tablet (75 mg total) by mouth 2 (two) times daily. 180 tablet 1   doxycycline (VIBRA-TABS) 100 MG tablet Take 1 tablet (100 mg total) by mouth 2 (two) times daily. 28 tablet 0   erythromycin ophthalmic ointment Place 1 application into the left eye at bedtime. 3.5 g 2   Famotidine (ZANTAC 360 PO) Take by mouth.     lisinopril (ZESTRIL) 20 MG tablet Take 1 tablet (20 mg total) by mouth daily. 90 tablet 1   Multiple Vitamins-Minerals (MENS 50+ MULTI VITAMIN/MIN) TABS Take 1 tablet by mouth daily.     predniSONE (DELTASONE) 20 MG tablet 60 mg PO x5 days, then decrease by 1/2 tab daily until tapered off. 23 tablet 0   valACYclovir (VALTREX) 1000 MG tablet Take 1 tablet (1,000 mg total) by mouth 3 (three) times daily for 10 days. 21 tablet 0   vitamin B-12 (CYANOCOBALAMIN) 1000 MCG tablet Take  2,500 mcg by mouth daily.      No current facility-administered medications on file prior to visit.    ALLERGIES: No Known Allergies  FAMILY HISTORY: Family History  Problem Relation Age of Onset   Arthritis Mother    Diabetes Mother    Hearing loss Mother    Stroke Mother    Hearing loss Father    Heart disease Father    AAA (abdominal aortic aneurysm) Father    Aneurysm Sister 61       brain, died from aneurysm suddenly.    Heart attack Brother    Arthritis/Rheumatoid Maternal Grandmother     Brain cancer Maternal Grandfather    Colon cancer Neg Hx    Colon polyps Neg Hx    Esophageal cancer Neg Hx    Stomach cancer Neg Hx    Rectal cancer Neg Hx    Social history the patient is married, has 1 son, some college education.  He is an Clinical biochemist.  Former smoker quit in 2001.  Objective:    General: No acute distress.  Patient appears well-groomed.   Head:  L facial droop, atraumatic Eyes:  fundi examined but not visualized Neck: supple, no paraspinal tenderness, full range of motion Back: No paraspinal tenderness Heart: regular rate and rhythm Lungs: Clear to auscultation bilaterally. Vascular: No carotid bruits. Neurological Exam: Mental status: alert and oriented to person, place, and time, recent and remote memory intact, fund of knowledge intact, attention and concentration intact, speech fluent and not dysarthric, language intact. Cranial nerves: PERRLA, visual fields intact, no nystagmus, L lid with decreased motion, not closing fully, facial sensation intact, hearing intact with hyperacusis on the L. TM normal, uvula midline tongue midline Bulk & Tone: normal, no fasciculations. Motor:  muscle strength 5/5 throughout Sensation:  Pinprick, temperature and vibratory sensation intact. Deep Tendon Reflexes:  2+ throughout,  toes downgoing.   Finger to nose testing:  Without dysmetria.   Heel to shin:  Without dysmetria.   Gait:  Normal station and stride.        Thank you for allowing me to take part in the care of this patient.  Sharene Butters, PA-C   QP:RFFMBW, Renee A, DO

## 2021-01-08 NOTE — Patient Instructions (Signed)
PLeasure to see you today :  Recommendations:  You will need referral to ophtalmology to evaluate your dry eye to prevent corneal abrasion  Finish the Bell's Palsy treatment.   Recommend Physical Therapy for Bell's Palsy   Follow up  as needed

## 2021-01-13 ENCOUNTER — Ambulatory Visit (INDEPENDENT_AMBULATORY_CARE_PROVIDER_SITE_OTHER): Payer: BC Managed Care – PPO | Admitting: Family Medicine

## 2021-01-13 ENCOUNTER — Encounter: Payer: Self-pay | Admitting: Family Medicine

## 2021-01-13 ENCOUNTER — Other Ambulatory Visit: Payer: Self-pay

## 2021-01-13 VITALS — BP 146/63 | HR 68 | Temp 98.3°F | Ht 64.0 in | Wt 226.0 lb

## 2021-01-13 DIAGNOSIS — G510 Bell's palsy: Secondary | ICD-10-CM

## 2021-01-13 DIAGNOSIS — I10 Essential (primary) hypertension: Secondary | ICD-10-CM

## 2021-01-13 MED ORDER — LISINOPRIL 30 MG PO TABS: 30.0000 mg | ORAL_TABLET | Freq: Every day | ORAL | 1 refills | Status: DC

## 2021-01-13 NOTE — Patient Instructions (Signed)
Do the facial exercises and massage.  PT will guide you from here.  Bell's Palsy, Adult  Bell's palsy is a short-term inability to move muscles in a part of the face. The inability to move, also called paralysis, results from inflammation or compression of the seventh cranial nerve. This nerve travels along the skull and under the ear to the side of the face. This nerve is responsible for facialmovements that include blinking, closing the eyes, smiling, and frowning. What are the causes? The exact cause of this condition is not known. It may be caused by an infection from a virus, such as the chickenpox (herpes zoster), Epstein-Barr, or mumps virus. What increases the risk? You are more likely to develop this condition if: You are pregnant. You have diabetes. You have had a recent infection in your nose, throat, or airways. You have a weakened body defense system (immune system). You have had a facial injury, such as a fracture. You have a family history of Bell's palsy. What are the signs or symptoms? Symptoms of this condition include: Weakness on one side of the face. Drooping eyelid and corner of the mouth. Excessive tearing in one eye. Difficulty closing the eyelid. Dry eye. Drooling. Dry mouth. Changes in taste. Change in facial appearance. Pain behind one ear. Ringing in one or both ears. Sensitivity to sound in one ear. Facial twitching. Headache. Impaired speech. Dizziness. Difficulty eating or drinking. Most of the time, only one side of the face is affected. In rare cases, Bell'spalsy may affect the whole face. How is this diagnosed? This condition is diagnosed based on: Your symptoms. Your medical history. A physical exam. You may also have to see health care providers who specialize in disorders of the nerves (neurologist) or diseases and conditions of the eye (ophthalmologist). You may have tests, such as: A test to check for nerve damage  (electromyogram). Imaging studies, such as a CT scan or an MRI. Blood tests. How is this treated? This condition affects every person differently. Sometimes symptoms go away without treatment within a couple weeks. If treatment is needed, it varies from person to person. The goal of treatment is to reduce inflammation and protect the eye from damage. Treatment for Bell's palsy may include: Medicines, such as: Steroids to reduce swelling and inflammation. Antiviral medicines. Pain relievers, including aspirin, acetaminophen, or ibuprofen. Eye drops or ointment to keep your eye moist. Eye protection, if you cannot close your eye. Exercises or massage to regain muscle strength and function (physical therapy). Follow these instructions at home:  Take over-the-counter and prescription medicines only as told by your health care provider. If your eye is affected: Keep your eye moist with eye drops or ointment as told by your health care provider. Follow instructions for eye care and protection as told by your health care provider. Do any physical therapy exercises as told by your health care provider. Keep all follow-up visits. This is important. Contact a health care provider if: You have a fever or chills. Your symptoms do not get better within 2-3 weeks, or your symptoms get worse. Your eye is red, irritated, or painful. You have new symptoms. Get help right away if: You have weakness or numbness in a part of your body other than your face. You have trouble swallowing. You develop neck pain or stiffness. You develop dizziness or shortness of breath. Summary Bell's palsy is a short-term inability to move muscles in a part of the face. The inability to move results from  inflammation or compression of the facial nerve. This condition affects every person differently. Sometimes symptoms go away without treatment within a couple weeks. If treatment is needed, it varies from person to person.  The goal of treatment is to reduce inflammation and protect the eye from damage. Contact your health care provider if your symptoms do not get better within 2-3 weeks, or your symptoms get worse. This information is not intended to replace advice given to you by your health care provider. Make sure you discuss any questions you have with your healthcare provider. Document Revised: 03/21/2020 Document Reviewed: 03/21/2020 Elsevier Patient Education  2022 [employer].

## 2021-01-13 NOTE — Progress Notes (Signed)
This visit occurred during the SARS-CoV-2 public health emergency.  Safety protocols were in place, including screening questions prior to the visit, additional usage of staff PPE, and extensive cleaning of exam room while observing appropriate contact time as indicated for disinfecting solutions.    Paul Phillips, Paul April 05, 1951, 70 y.o., male MRN: [id number] Patient Care Team    Relationship Specialty Notifications Start End  Ma Hillock, DO PCP - General Family Medicine  06/19/16   Orbie Hurst, MD Referring Physician Dermatology  12/21/18   Rondel Jumbo, PA-C  Neurology  01/08/21   Pieter Partridge, [place] Physician Neurology  01/08/21     Chief Complaint  Patient presents with   Facial Droop    Bell palsy f/u     Subjective: Pt presents for an OV for cmc/HLD follow up HTN/HLD/Morbid obesity/thrombocytopenia: Pt reports compliance with  lisnopril 20 mg QD. Patient denies chest pain, shortness of breath, dizziness or lower extremity edema.   Pt takes a daily baby ASA. Pt is tolerating atorvastatin.  He is taking fish oil supplementation since his last visit. Diet: Started watching his diet very closely. Exercise: Is exercising routinely.   RF: Hypertension, hypertriglyceridemia, former smoker, strong family history of heart disease, stroke and aneurysms   Bell's palsy:Pt reports a mild improvement in his facial droop at the level of his forehead and eye. He can chew some now as well, but not more difficult meats. He has finished the valtrex and almost finished with doxy. Prednisone taper is completed. He has seen neuro and has neuro PT in [place] scheduled to start Wednesday.  Prior note: Patient reports he noticed an odd sensation on the left side of his face when trying to chew/eat his meal.  Then Thursday morning when he awoke he noticed a left-sided facial droop.  He did not seek any medical attention until coming in for his chronic medical condition appointment  today.  He reports he is unable to move the left side of his face, close his eye or chew appropriately on the left side of his mouth.  He denies any weakness in his extremities.  He denies headache or chest pain.  He reports he figured he had Bell's palsy.  He had a coworker who also had Bell's palsy in the past.  He reports he did find a tick on him about 2 months ago and he felt it probably had been on there for about 3 or 4 days before he was able to remove.  Depression screen [facility] 2/9 07/15/2020 06/26/2019 12/21/2018 12/10/2017 07/07/2017  Decreased Interest 0 0 0 0 0  Down, Depressed, Hopeless 0 0 0 - 0  PHQ - 2 Score 0 0 0 0 0    No Known Allergies Social History   Social History Narrative   Married to Paul Phillips. 1 adult child Paul Phillips.   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    Former smoker, quit 2001   Drinks caffeine, takes a daily vitamin.   Wear seatbelt. Smoke detector in the home. Firearms in the home.   safe in his relationships.   Right handed    Past Medical History:  Diagnosis Date   Arthritis    Dislocated thumb    GERD (gastroesophageal reflux disease)    Heart murmur    Hypertension    Past Surgical History:  Procedure Laterality Date   COLONOSCOPY  10/26/2016   TA   Family History  Problem Relation Age of  Onset   Arthritis Mother    Diabetes Mother    Hearing loss Mother    Stroke Mother    Hearing loss Father    Heart disease Father    AAA (abdominal aortic aneurysm) Father    Aneurysm Sister 58       brain, died from aneurysm suddenly.    Heart attack Brother    Arthritis/Rheumatoid Maternal Grandmother    Brain cancer Maternal Grandfather    Colon cancer Neg Hx    Colon polyps Neg Hx    Esophageal cancer Neg Hx    Stomach cancer Neg Hx    Rectal cancer Neg Hx    Allergies as of 01/13/2021   No Known Allergies      Medication List        Accurate as of January 13, 2021  4:13 PM. If you have any questions, ask your nurse or doctor.           STOP taking these medications    doxycycline 100 MG tablet Commonly known as: VIBRA-TABS Stopped by: Howard Pouch, DO   predniSONE 20 MG tablet Commonly known as: DELTASONE Stopped by: Howard Pouch, DO       TAKE these medications    atorvastatin 10 MG tablet Commonly known as: LIPITOR Take 1 tablet (10 mg total) by mouth daily.   diclofenac 75 MG EC tablet Commonly known as: VOLTAREN Take 1 tablet (75 mg total) by mouth 2 (two) times daily.   erythromycin ophthalmic ointment Place 1 application into the left eye at bedtime.   lisinopril 30 MG tablet Commonly known as: ZESTRIL Take 1 tablet (30 mg total) by mouth daily. What changed:  medication strength how much to take Changed by: Howard Pouch, DO   Mens 50+ Multi Vitamin/Min Tabs Take 1 tablet by mouth daily.   vitamin B-12 1000 MCG tablet Commonly known as: CYANOCOBALAMIN Take 2,500 mcg by mouth daily.   ZANTAC 360 PO Take by mouth.        All past medical history, surgical history, allergies, family history, immunizations andmedications were updated in the EMR today and reviewed under the history and medication portions of their EMR.     ROS: Negative, with the exception of above mentioned in HPI   Objective:  BP (!) 146/63   Pulse 68   Temp 98.3 F (36.8 C) (Oral)   Ht 5\' 4"  (1.626 m)   Wt 226 lb (102.5 kg)   SpO2 96%   BMI 38.79 kg/m  Body mass index is 38.79 kg/m. Gen: Afebrile. No acute distress. Nontoxic. Pleasant male.  HENT: AT. [place].  Eyes:Pupils Equal Round Reactive to light, Extraocular movements intact,  Conjunctiva without redness, discharge or icterus. Neuro:Normal gait. PERLA. EOMi. Alert. Oriented x3. Still unable to close eye fully, eye no longer drooping. Mild forehead movement appreciated. Left nasolabial fold and mouth remains the same.  Psych: Normal affect, dress and demeanor. Normal speech. Normal thought content and judgment.   No results found. No results  found. No results found for this or any previous visit (from the past 24 hour(s)).  Assessment/Plan: [name] is a 70 y.o. male present for OV for  Bell's palsy: - encouraged him to start facial expression exercises- demonstrated today.  - facial massage. - neuro PT - continue nightly eye ointment and daily eye lubrication until able to fully close eye.  - HTN: increase lisinopril again to 30 mg qd.   F/U for his routine cmc dec/jan-  sooner if needed.    Reviewed expectations re: course of current medical issues. Discussed self-management of symptoms. Outlined signs and symptoms indicating need for more acute intervention. Patient verbalized understanding and all questions were answered. Patient received an After-Visit Summary.    No orders of the defined types were placed in this encounter.   Meds ordered this encounter  Medications   lisinopril (ZESTRIL) 30 MG tablet    Sig: Take 1 tablet (30 mg total) by mouth daily.    Dispense:  90 tablet    Refill:  1    Hold until pt calls in. Dose change . DC prior doses.     Referral Orders  No referral(s) requested today    Note is dictated utilizing voice recognition software. Although note has been proof read prior to signing, occasional typographical errors still can be missed. If any questions arise, please do not hesitate to call for verification.   electronically signed by:  Howard Pouch, DO  [place]

## 2021-01-15 ENCOUNTER — Encounter: Payer: Self-pay | Admitting: Rehabilitative and Restorative Service Providers"

## 2021-01-15 ENCOUNTER — Other Ambulatory Visit: Payer: Self-pay

## 2021-01-15 ENCOUNTER — Ambulatory Visit: Payer: BC Managed Care – PPO | Admitting: Rehabilitative and Restorative Service Providers"

## 2021-01-15 DIAGNOSIS — G510 Bell's palsy: Secondary | ICD-10-CM

## 2021-01-15 DIAGNOSIS — R29810 Facial weakness: Secondary | ICD-10-CM

## 2021-01-15 NOTE — Patient Instructions (Signed)
Exercises to Help Bell's Palsy Facial exercises and physical therapy for Bell's palsy help to increase muscle strength and to regain facial coordination from this temporary facial paralysis. Most exercises should be done three or four times a day in short sessions, with up to 20-30 repetitions per exercise. Facial Stimulation Before you begin the facial exercises, it's important to warm up and stimulate your muscles first.To correctly do these facial exercises, experts suggest sitting in front of a mirror so that you can clearly see your face and watch your muscle movements. Step 1: Begin by trying to move every part of your face slowly and gently.  Step 2: Use your fingers to gently lift your eyebrows. One side will lift higher than the other, but don't apply too much force to the side that is drooping.  Step 3: Using your fingers, gently massage the different parts of your face, including your forehead, nose, cheeks, and mouth. Step 4: Ice massage using ice cube Left side of face  Step 5: Gently pinch cheek  Nose and Cheek Exercises After warming up, you can work on the area of your cheeks and nose. This area is important since any stiffness or weak muscles in this zone can affect the strength of the entire face as you recover.  Step 1: Using your fingers, gently push up the skin next to your nose on the affected side while trying to wrinkle your nose.  Step 2: Try to scrunch up your face, focusing on the cheeks and nose.  Step 3: Flare your nostrils and try to take some deep breaths through your nose. You can cover your unaffected nostril to force the affected muscles to work harder.  Step 4: Puff up your cheeks and blow the air out. Repeat this 10 times. Stick tongue out and move side to side  Push cheek out with tongue  Roll tongue  Lick ice pop, ice cube, lollypop Practice drinking from a straw with straw in center  Practice chewing on left side  Smile or grin Laughing  Alphabet  exaggerating movements of mouth  Singing  Opening mouth wide  Close eyes tight and open wide  Lifting your eye brow

## 2021-01-15 NOTE — Therapy (Signed)
[place] [address] [place], [place], [address] Phone: [phone]   Fax:  [phone]  Physical Therapy Evaluation  Patient Details  Name: Paul Phillips MRN: [id number] Date of Birth: [birth year] Referring Provider (PT): Dr Howard Pouch   Encounter Date: 01/15/2021   PT End of Session - 01/15/21 1507     Visit Number 1    Number of Visits 4    Date for PT Re-Evaluation 02/12/21    PT Start Time 1427    PT Stop Time 1505    PT Time Calculation (min) 38 min    Activity Tolerance Patient tolerated treatment well             Past Medical History:  Diagnosis Date   Arthritis    Dislocated thumb    GERD (gastroesophageal reflux disease)    Heart murmur    Hypertension     Past Surgical History:  Procedure Laterality Date   COLONOSCOPY  10/26/2016   TA    There were no vitals filed for this visit.    Subjective Assessment - 01/15/21 1427     Subjective Patient reports that he started having weakness in the Lt side of face 3 weeks ago. He had difficulty with facial expressions on Lt side but he has noted improving movement in the Lt forehead and eye in the past 4-5 days.    Pertinent History HTN; obesity; OA    Patient Stated Goals get face working again    Currently in Pain? No/denies                [facility] PT Assessment - 01/15/21 0001       Assessment   Medical Diagnosis Bell's Palsy    Referring Provider (PT) Dr Howard Pouch    Onset Date/Surgical Date 12/25/20    Hand Dominance Right    Next MD Visit PRN    Prior Therapy none      Precautions   Precautions None      Restrictions   Weight Bearing Restrictions No      Balance Screen   Has the patient fallen in the past 6 months No    Has the patient had a decrease in activity level because of a fear of falling?  No    Is the patient reluctant to leave their home because of a fear of falling?  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 Notable droop of Lt mouth with smiling or grinning. Tongue protrudes to slightly Rt of midline but pt can move tongue side to side. Unable to hold air in mouth with puff of air in cheeks.                        Objective measurements completed on examination: See above findings.       [place]  Adult PT Treatment/Exercise - 01/15/21 0001       Exercises   Exercises --   instructed in stimulation of musculature of Lt face and exercises to encourage movement on face (see copy of HEP in chart)                   PT Education - 01/15/21 1456     Education Details HEP    Person(s) Educated Patient    Methods Explanation;Demonstration;Tactile cues;Verbal cues;Handout    Comprehension Verbalized understanding;Returned demonstration;Verbal cues required;Tactile cues required              PT Short Term Goals - 01/15/21 1513       PT SHORT TERM GOAL #1   Title Instruct patient in HEP for Bell's Palsy    Time 1    Period Days    Status Achieved    Target Date 01/15/21      PT SHORT TERM GOAL #2   Title Further assessment and recommendations as patient schedules    Time 4    Period Weeks    Status New    Target Date 02/11/21                       Plan - 01/15/21 1508     Clinical Impression Statement Ray presents with signs and symptoms of Lt sided Bell's palsy with symptoms starting  12/25/20. He has noticed some improvement in the past 5 days with some movement in the forehead and eye muscles. Patient has decresaed active movement in Lt facial muscles noted. He was instructed in stimulation for facial musculature externally and internally as well as active assistive exercises and active exercies. Patient will call to schedule additional appointments as needed.    Stability/Clinical Decision Making Stable/Uncomplicated    Clinical Decision Making Low    Rehab Potential Good    PT Frequency 1x / week    PT Duration 4 weeks    PT Treatment/Interventions ADLs/Self Care Home Management;Cryotherapy;Moist Heat;Electrical Stimulation;Therapeutic activities;Therapeutic exercise;Neuromuscular re-education;Patient/family education;Manual techniques;Dry needling;Taping    PT Next Visit Plan review exercises; consider trial of e-stim and manual work as indicated - patient will call to schedule additional appointments as needed    PT Home Exercise Plan see instructions in chart    Consulted and Agree with Plan of Care Patient             Patient will benefit from skilled therapeutic intervention in order to improve the following deficits and impairments:  Decreased strength, Decreased mobility  Visit Diagnosis: Bell's palsy  Facial weakness     Problem List Patient Active Problem List   Diagnosis Date Noted   Bell's palsy 12/31/2020   Arthritis 06/22/2018   Sleep disturbance 06/22/2018   Essential hypertension 12/27/2017   Family history of abdominal aortic aneurysm (AAA) 12/10/2017   Former smoker 12/10/2017   Hypertriglyceridemia 07/07/2017   Colon polyps 11/16/2016   Morbid obesity with BMI of 40.0-44.9, adult ([place]) 06/19/2016    Mikhia Dusek Nilda Simmer PT, MPH  01/15/2021, 3:15 PM  [facility] [address] [place], [place], [address] Phone: [phone]   Fax:  [phone]  Name: Laroy Mustard MRN:  [id number] Date of Birth: 06-15-1951

## 2021-02-12 ENCOUNTER — Ambulatory Visit (INDEPENDENT_AMBULATORY_CARE_PROVIDER_SITE_OTHER): Payer: BC Managed Care – PPO | Admitting: Family Medicine

## 2021-02-12 ENCOUNTER — Encounter: Payer: Self-pay | Admitting: Family Medicine

## 2021-02-12 ENCOUNTER — Other Ambulatory Visit: Payer: Self-pay

## 2021-02-12 ENCOUNTER — Ambulatory Visit (INDEPENDENT_AMBULATORY_CARE_PROVIDER_SITE_OTHER): Payer: BC Managed Care – PPO

## 2021-02-12 VITALS — BP 145/65 | HR 65 | Temp 98.3°F | Ht 64.0 in | Wt 223.0 lb

## 2021-02-12 DIAGNOSIS — M47816 Spondylosis without myelopathy or radiculopathy, lumbar region: Secondary | ICD-10-CM | POA: Diagnosis not present

## 2021-02-12 DIAGNOSIS — M25551 Pain in right hip: Secondary | ICD-10-CM | POA: Diagnosis not present

## 2021-02-12 DIAGNOSIS — M25559 Pain in unspecified hip: Secondary | ICD-10-CM

## 2021-02-12 DIAGNOSIS — M1611 Unilateral primary osteoarthritis, right hip: Secondary | ICD-10-CM | POA: Diagnosis not present

## 2021-02-12 MED ORDER — METHYLPREDNISOLONE 4 MG PO TBPK: ORAL_TABLET | ORAL | 0 refills | Status: DC

## 2021-02-12 MED ORDER — CYCLOBENZAPRINE HCL 10 MG PO TABS: 10.0000 mg | ORAL_TABLET | Freq: Two times a day (BID) | ORAL | 1 refills | Status: DC | PRN

## 2021-02-12 NOTE — Progress Notes (Signed)
This visit occurred during the SARS-CoV-2 public health emergency.  Safety protocols were in place, including screening questions prior to the visit, additional usage of staff PPE, and extensive cleaning of exam room while observing appropriate contact time as indicated for disinfecting solutions.    Paul Phillips, Paul Phillips May 09, 1951, 70 y.o., male MRN: [id number] Patient Care Team    Relationship Specialty Notifications Start End  Ma Hillock, DO PCP - General Family Medicine  06/19/16   Paul Hurst, MD Referring Physician Dermatology  12/21/18   Rondel Jumbo, PA-C  Neurology  01/08/21   Paul Phillips, [place] Physician Neurology  01/08/21     Chief Complaint  Patient presents with   Hip Pain    Pt c/o right hip pain that radiates to the groin area x 2 weeks     Subjective: Pt presents for an OV with complaints of right hip pain that radiates to his groin of 2 weeks duration.  He does not recall any injury around that time.  He states he was camping little over 2 weeks ago and was lifting heavy objects and hiking rough terrain.  He reports his hip hurts if he lays on his right hip and when bending his leg outwards and resting his foot on his other need to tie his shoe causes pain.  He says the pain can be sharp but once it occurs it is a dull constant pain that can start in his lateral hip and radiate to his groin.  He had used a Salonpas patch last night and feels it was helpful.  He is taking diclofenac for his chronic pain and does not feel that is helpful.  He denies any numbness or tingling radiating to his foot.  He has no prior injury to his hip in the past or surgical procedures.  He does have a history of arthritis in other joints.  Depression screen [facility] 2/9 07/15/2020 06/26/2019 12/21/2018 12/10/2017 07/07/2017  Decreased Interest 0 0 0 0 0  Down, Depressed, Hopeless 0 0 0 - 0  PHQ - 2 Score 0 0 0 0 0    No Known Allergies Social History   Social History  Narrative   Married to Paul Phillips. 1 adult child [place].   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    Former smoker, quit 2001   Drinks caffeine, takes a daily vitamin.   Wear seatbelt. Smoke detector in the home. Firearms in the home.   safe in his relationships.   Right handed    Past Medical History:  Diagnosis Date   Arthritis    Dislocated thumb    GERD (gastroesophageal reflux disease)    Heart murmur    Hypertension    Past Surgical History:  Procedure Laterality Date   COLONOSCOPY  10/26/2016   TA   Family History  Problem Relation Age of Onset   Arthritis Mother    Diabetes Mother    Hearing loss Mother    Stroke Mother    Hearing loss Father    Heart disease Father    AAA (abdominal aortic aneurysm) Father    Aneurysm Sister 9       brain, died from aneurysm suddenly.    Heart attack Brother    Arthritis/Rheumatoid Maternal Grandmother    Brain cancer Maternal Grandfather    Colon cancer Neg Hx    Colon polyps Neg Hx    Esophageal cancer Neg Hx    Stomach cancer Neg  Hx    Rectal cancer Neg Hx    Allergies as of 02/12/2021   No Known Allergies      Medication List        Accurate as of February 12, 2021 12:33 PM. If you have any questions, ask your nurse or doctor.          atorvastatin 10 MG tablet Commonly known as: LIPITOR Take 1 tablet (10 mg total) by mouth daily.   cyclobenzaprine 10 MG tablet Commonly known as: FLEXERIL Take 1 tablet (10 mg total) by mouth 2 (two) times daily as needed for muscle spasms. Started by: Howard Pouch, DO   diclofenac 75 MG EC tablet Commonly known as: VOLTAREN Take 1 tablet (75 mg total) by mouth 2 (two) times daily.   erythromycin ophthalmic ointment Place 1 application into the left eye at bedtime.   lisinopril 30 MG tablet Commonly known as: ZESTRIL Take 1 tablet (30 mg total) by mouth daily.   Mens 50+ Multi Vitamin/Min Tabs Take 1 tablet by mouth daily.   methylPREDNISolone 4 MG Tbpk  tablet Commonly known as: MEDROL DOSEPAK Per package taper instruction Started by: Howard Pouch, DO   vitamin B-12 1000 MCG tablet Commonly known as: CYANOCOBALAMIN Take 2,500 mcg by mouth daily.   ZANTAC 360 PO Take by mouth.        All past medical history, surgical history, allergies, family history, immunizations andmedications were updated in the EMR today and reviewed under the history and medication portions of their EMR.     ROS: Negative, with the exception of above mentioned in HPI   Objective:  BP (!) 145/65   Pulse 65   Temp 98.3 F (36.8 C) (Oral)   Ht '5\' 4"'$  (1.626 m)   Wt 223 lb (101.2 kg)   SpO2 96%   BMI 38.28 kg/m  Body mass index is 38.28 kg/m. Gen: Afebrile. No acute distress. Nontoxic in appearance, well developed, well nourished. Very pleasant obese male.  HENT: AT. [place].  Eyes:Pupils Equal Round Reactive to light, Extraocular movements intact,  Conjunctiva without redness, discharge or icterus. MSK(right hip): no erythema, no soft tissue swelling or bruising. No TTP over bony processes of femur. Decreased flexion hip- w/ pain. Pain with internal rotation hip. MS 5/5 bilateral LE with pain on right hip flexion. +FABRE for ant. Hip pain and lateral hip pain.  Neuro: Normal gait. PERLA. EOMi. Alert. Oriented x3  Psych: Normal affect, dress and demeanor. Normal speech. Normal thought content and judgment.  No results found. No results found. No results found for this or any previous visit (from the past 24 hour(s)).  Assessment/Plan: Paul Phillips is a 70 y.o. male present for OV for  Hip pain Hip flexor and/or hip joint cause suspected. Poss. Arthritic flare.- no injury reported, but was camping and active prior to onset.  Medrol dose pack prescribed.  Flexeril QHS and once daily if able to tolerate- sedation precautions discussed.  - DG Hip Unilat W OR W/O Pelvis Min 4 Views Right; Future - pt will be called with results and further plan  discussed.   Reviewed expectations re: course of current medical issues. Discussed self-management of symptoms. Outlined signs and symptoms indicating need for more acute intervention. Patient verbalized understanding and all questions were answered. Patient received an After-Visit Summary.    Orders Placed This Encounter  Procedures   DG Hip Unilat W OR W/O Pelvis Min 4 Views Right   Meds ordered this encounter  Medications   cyclobenzaprine (FLEXERIL) 10 MG tablet    Sig: Take 1 tablet (10 mg total) by mouth 2 (two) times daily as needed for muscle spasms.    Dispense:  60 tablet    Refill:  1   methylPREDNISolone (MEDROL DOSEPAK) 4 MG TBPK tablet    Sig: Per package taper instruction    Dispense:  21 tablet    Refill:  0   Referral Orders  No referral(s) requested today     Note is dictated utilizing voice recognition software. Although note has been proof read prior to signing, occasional typographical errors still can be missed. If any questions arise, please do not hesitate to call for verification.   electronically signed by:  Howard Pouch, DO  [place]

## 2021-02-12 NOTE — Patient Instructions (Signed)
[facility] is located at [address], [place], [place] [address]  Called in steroid taper pack and muscle relaxer. We will call you with results.   Hip Pain The hip is the joint between the upper legs and the lower pelvis. The bones, cartilage, tendons, and muscles of your hip joint support your body and allowyou to move around. Hip pain can range from a minor ache to severe pain in one or both of your hips. The pain may be felt on the inside of the hip joint near the groin, or on the outside near the buttocks and upper thigh. You may also have swelling orstiffness in your hip area. Follow these instructions at home: Managing pain, stiffness, and swelling     If directed, put ice on the painful area. To do this: Put ice in a plastic bag. Place a towel between your skin and the bag. Leave the ice on for 20 minutes, 2-3 times a day. If directed, apply heat to the affected area as often as told by your health care provider. Use the heat source that your health care provider recommends, such as a moist heat pack or a heating pad. Place a towel between your skin and the heat source. Leave the heat on for 20-30 minutes. Remove the heat if your skin turns bright red. This is especially important if you are unable to feel pain, heat, or cold. You may have a greater risk of getting burned. Activity Do exercises as told by your health care provider. Avoid activities that cause pain. General instructions  Take over-the-counter and prescription medicines only as told by your health care provider. Keep a journal of your symptoms. Write down: How often you have hip pain. The location of your pain. What the pain feels like. What makes the pain worse. Sleep with a pillow between your legs on your most comfortable side. Keep all follow-up visits as told by your health care provider. This is important.  Contact a health care provider if: You cannot put weight on your leg. Your pain or  swelling continues or gets worse after one week. It gets harder to walk. You have a fever. Get help right away if: You fall. You have a sudden increase in pain and swelling in your hip. Your hip is red or swollen or very tender to touch. Summary Hip pain can range from a minor ache to severe pain in one or both of your hips. The pain may be felt on the inside of the hip joint near the groin, or on the outside near the buttocks and upper thigh. Avoid activities that cause pain. Write down how often you have hip pain, the location of the pain, what makes it worse, and what it feels like. This information is not intended to replace advice given to you by your health care provider. Make sure you discuss any questions you have with your healthcare provider. Document Revised: 11/07/2018 Document Reviewed: 11/07/2018 Elsevier Patient Education  [place].

## 2021-02-13 ENCOUNTER — Telehealth: Payer: Self-pay | Admitting: Family Medicine

## 2021-02-13 DIAGNOSIS — M24051 Loose body in right hip: Secondary | ICD-10-CM

## 2021-02-13 DIAGNOSIS — M1611 Unilateral primary osteoarthritis, right hip: Secondary | ICD-10-CM

## 2021-02-13 DIAGNOSIS — M25559 Pain in unspecified hip: Secondary | ICD-10-CM

## 2021-02-13 NOTE — Telephone Encounter (Signed)
Spoke with patient regarding results/recommendations,voiced understanding.  

## 2021-02-13 NOTE — Telephone Encounter (Signed)
Please call patient:  His hip x-ray showed evidence of moderate right hip arthritis.  There is no fracture or acute process that can be identified as source of pain.  However he does have a bony loose body of about 1 cm in the bottom portion of his hip joint.  These are usually pieces of cartilage or arthritic bone that have broken off over time and flow in the joint.  This can obviously cause discomfort and may be the source of his pain, it may have been there for a very long time and just irritated from his camping trip activities.  I recommend he continue the steroid Dosepak we prescribed.  I have also placed a referral to orthopedics for him to follow-up with for recommendations on the  arthritis level and the bony loose body. I tried to put a referral in for the new [facility] in [place] [address].  They are supposed to open up in August, so hopefully he will get into them.

## 2021-02-17 ENCOUNTER — Other Ambulatory Visit: Payer: Self-pay

## 2021-02-17 ENCOUNTER — Telehealth: Payer: Self-pay

## 2021-02-17 ENCOUNTER — Encounter: Payer: Self-pay | Admitting: Family Medicine

## 2021-02-17 ENCOUNTER — Ambulatory Visit (INDEPENDENT_AMBULATORY_CARE_PROVIDER_SITE_OTHER): Payer: BC Managed Care – PPO | Admitting: Family Medicine

## 2021-02-17 VITALS — BP 115/61 | HR 79 | Temp 97.4°F | Resp 16 | Ht 64.0 in | Wt 220.2 lb

## 2021-02-17 DIAGNOSIS — M24051 Loose body in right hip: Secondary | ICD-10-CM

## 2021-02-17 DIAGNOSIS — M1611 Unilateral primary osteoarthritis, right hip: Secondary | ICD-10-CM | POA: Diagnosis not present

## 2021-02-17 DIAGNOSIS — M25551 Pain in right hip: Secondary | ICD-10-CM | POA: Diagnosis not present

## 2021-02-17 MED ORDER — HYDROCODONE-ACETAMINOPHEN 5-325 MG PO TABS: 1.0000 | ORAL_TABLET | Freq: Four times a day (QID) | ORAL | 0 refills | Status: DC | PRN

## 2021-02-17 NOTE — Progress Notes (Signed)
OFFICE VISIT  02/17/2021  CC:  Chief Complaint  Patient presents with   Hip Pain    Ongoing, last saw PCP 8/10. Given rx,imaging completed, and ortho referral completed but no relief    HPI:    Patient is a 70 y.o. Caucasian male who presents for ongoing pain in R hip. Dr. Raoul Pitch saw him 5 d/a for same concern.  Dx'd with osteoarthritis flare, rx'd prednisone and x-ray ordered. 02/12/21 right hip plain films: IMPRESSION: Moderate right hip osteoarthritis, with ossified body overlying the inferior aspect of the joint.  Medrol dose pak and flexeril were rx'd and referral to orthopedics was ordered.  INTERIM HX: Pt rested his hip x 2d and felt signif better but today when he got up into normal activity at home and work the pain returned to a severe level.  Hurts laterally mostly now, was more anterior R hip.  NO low back pain or radiating pain or paresthesias. Has taken steroids as rx'd.  Also on diclofenac chronically for hands arthritis.  Again, the hip started hurting a few weeks ago after he went camping.  Pt states Ortho appt not made yet b/c [facility] at [place] location was specified on the referral but that location is not open yet.   PMP AWARE reviewed today: Nothing is listed.    Past Medical History:  Diagnosis Date   Arthritis    Dislocated thumb    GERD (gastroesophageal reflux disease)    Heart murmur    Hypertension     Past Surgical History:  Procedure Laterality Date   COLONOSCOPY  10/26/2016   TA    Outpatient Medications Prior to Visit  Medication Sig Dispense Refill   atorvastatin (LIPITOR) 10 MG tablet Take 1 tablet (10 mg total) by mouth daily. 90 tablet 3   cyclobenzaprine (FLEXERIL) 10 MG tablet Take 1 tablet (10 mg total) by mouth 2 (two) times daily as needed for muscle spasms. 60 tablet 1   diclofenac (VOLTAREN) 75 MG EC tablet Take 1 tablet (75 mg total) by mouth 2 (two) times daily. 180 tablet 1   erythromycin ophthalmic ointment  Place 1 application into the left eye at bedtime. 3.5 g 2   Famotidine (ZANTAC 360 PO) Take by mouth.     lisinopril (ZESTRIL) 30 MG tablet Take 1 tablet (30 mg total) by mouth daily. 90 tablet 1   methylPREDNISolone (MEDROL DOSEPAK) 4 MG TBPK tablet Per package taper instruction 21 tablet 0   Multiple Vitamins-Minerals (MENS 50+ MULTI VITAMIN/MIN) TABS Take 1 tablet by mouth daily.     vitamin B-12 (CYANOCOBALAMIN) 1000 MCG tablet Take 2,500 mcg by mouth daily.      No facility-administered medications prior to visit.    No Known Allergies  ROS As per HPI  PE: Vitals with BMI 02/17/2021 02/12/2021 02/12/2021  Height '5\' 4"'$  - '5\' 4"'$   Weight 220 lbs 3 oz - 223 lbs  BMI [id number] - 0000000  Systolic [id number] [id number]  Diastolic 61 65 60  Pulse 79 65 64   Gen: Alert, well appearing.  Patient is oriented to person, place, time, and situation. AFFECT: pleasant, lucid thought and speech. Walks fairly slow with mild antalgic gait. No back, glut, lateral hip, or groin tenderness. Mild pain with passive IR R hip, mild/mod pain with passive R hip flexion.  Active hip flexion elicits some pain in lateral aspect of R hip. Strength in prox/dist R leg 5/5.   LABS:    Chemistry  Component Value Date/Time   NA 138 12/30/2020 1529   K 4.4 12/30/2020 1529   CL 108 12/30/2020 1529   CO2 21 12/30/2020 1529   BUN 17 12/30/2020 1529   CREATININE 1.02 12/30/2020 1529   CREATININE 1.08 07/25/2020 1320   CREATININE 1.18 07/15/2020 1514      Component Value Date/Time   CALCIUM 10.6 (H) 12/30/2020 1529   ALKPHOS 58 12/30/2020 1529   AST 14 12/30/2020 1529   AST 19 07/25/2020 1320   ALT 17 12/30/2020 1529   ALT 20 07/25/2020 1320   BILITOT 0.8 12/30/2020 1529   BILITOT 0.7 07/25/2020 1320     Lab Results  Component Value Date   HGBA1C 5.1 07/15/2020   IMPRESSION AND PLAN:  Right hip pain, osteoarthritis with loose body noted on recent plain films. Oral steroids no help.   I'll rx vicodin  5/325, 1-2 q6h prn, #30.  Therapeutic expectations and side effect profile of medication discussed today.  Patient's questions answered. We arranged for him to see ortho in 2d--[facility] in [place]. I did write a letter for his employer stating he can be out of work 8/15 to 03/05/21 but we may be able to get him back earlier depending on how things go at the orthopedist.  An After Visit Summary was printed and given to the patient.  FOLLOW UP: No follow-ups on file.  Signed:  Crissie Sickles, MD           02/17/2021

## 2021-02-17 NOTE — Telephone Encounter (Signed)
Pt has scheduled appt with another provider   [facility] Patient Name: Paul Phillips Gender: Male DOB: 02/10/1951 Age: 70 Y 11 M 19D Return Phone Number:[phone] (Primary), [phone] (Secondary) Address:City/State/Zip: [place] [address] Client [facility] - Client Client [facility] [place] - Day Physician Raoul Pitch, [place] Contact Type Call Who Is Calling Patient / Member / Family / Caregiver Call Type Triage / Clinical Caller Name Otelia Sergeant Relationship To Patient Spouse Return Phone Number [phone] (Primary) Chief Complaint Hip pain Reason for Call Symptomatic / Request for [place] states her husband has hip pain, his muscle relaxer is not helping at all. Translation No Nurse Assessment Nurse: Markus Daft, RN, [place] Date/Time (Eastern Time): 02/15/2021 3:03:46 PM Confirm and document reason for call. If symptomatic, describe symptoms. ---Caller states that her husband has right hip pain. Seen by MD on Monday or Tuesday. Given Prednisone, and muscle relaxer, Flexeril BID for 3-4 days, is not helping at all. Needs help walking now d/t severe pain. No known injury. S/S started 2 wks ago. Does the patient have any new or worsening symptoms? ---Yes Will a triage be completed? ---Yes Related visit to physician within the last 2 weeks? ---Yes Does the PT have any chronic conditions? (i.e. diabetes, asthma, this includes High risk factors for pregnancy, etc.) ---Yes List chronic conditions. ---acid reflux, HTN Is this a behavioral health or substance abuse call? ---No Guidelines Guideline Title Affirmed Question Affirmed Notes Nurse Date/Time (Eastern Time) Hip Pain [1] SEVERE pain (e.g., excruciating, unable to do any normal activities) AND [2] not improved after 2 hours of pain medicine Markus Daft, RN,  Sherre Poot 02/15/2021 3:07:32 PM PLEASE NOTE: All timestamps contained within this report are represented as [place] Standard Time. CONFIDENTIALTY NOTICE: This fax transmission is intended only for the addressee. It contains information that is legally privileged, confidential or otherwise protected from use or disclosure. If you are not the intended recipient, you are strictly prohibited from reviewing, disclosing, copying using or disseminating any of this information or taking any action in reliance on or regarding this information. If you have received this fax in error, please notify us immediately by telephone so that we can arrange for its return to [place]. Phone: [phone], Toll-Free: [phone], Fax: [phone] Page: 2 of 2 Call Id: [id number] [place]. Time Eilene Ghazi Time) Disposition Final User 02/15/2021 3:09:53 PM See HCP within 4 Hours (or PCP triage) Yes Markus Daft, RN, Kenton Kingfisher Disagree/Comply Comply Caller Understands Yes PreDisposition Did not know what to do Care Advice Given Per Guideline SEE HCP (OR PCP TRIAGE) WITHIN 4 HOURS: PAIN MEDICINES: * For pain relief, you can take either acetaminophen, ibuprofen, or naproxen. * Before taking any medicine, read all the instructions on the package. * IF OFFICE WILL BE CLOSED AND NO PCP (PRIMARY CARE PROVIDER) SECOND-LEVEL TRIAGE: You need to be seen within the next 3 or 4 hours. A nearby Urgent Care Center [facility]) is often a good source of care. Another choice is to go to the ED. Go sooner if you become worse. CALL BACK IF: * You become worse CARE ADVICE given per Hip Pain (Adult) guideline. Comments User: Mayford Knife, RN Date/Time Eilene Ghazi Time): 02/15/2021 3:08:38 PM x-ray - referred to orthopedic doctor, as far as she knows no fracture. User: Mayford Knife, RN Date/Time Eilene Ghazi Time): 02/15/2021 3:14:38 PM She will try Tylenol as they do not have any ibuprofen. Referrals GO TO  FACILITY UNDECIDED

## 2021-02-17 NOTE — Telephone Encounter (Signed)
Received FMLA/STD paperwork from [employer]; forwarded to provider/SLS

## 2021-02-19 ENCOUNTER — Other Ambulatory Visit: Payer: Self-pay | Admitting: Orthopedic Surgery

## 2021-02-19 DIAGNOSIS — M25551 Pain in right hip: Secondary | ICD-10-CM | POA: Diagnosis not present

## 2021-02-22 ENCOUNTER — Ambulatory Visit (INDEPENDENT_AMBULATORY_CARE_PROVIDER_SITE_OTHER): Payer: BC Managed Care – PPO

## 2021-02-22 ENCOUNTER — Other Ambulatory Visit: Payer: Self-pay

## 2021-02-22 DIAGNOSIS — M25551 Pain in right hip: Secondary | ICD-10-CM | POA: Diagnosis not present

## 2021-02-22 DIAGNOSIS — S76311A Strain of muscle, fascia and tendon of the posterior muscle group at thigh level, right thigh, initial encounter: Secondary | ICD-10-CM | POA: Diagnosis not present

## 2021-02-22 DIAGNOSIS — M1611 Unilateral primary osteoarthritis, right hip: Secondary | ICD-10-CM | POA: Diagnosis not present

## 2021-02-22 DIAGNOSIS — M25451 Effusion, right hip: Secondary | ICD-10-CM | POA: Diagnosis not present

## 2021-02-22 DIAGNOSIS — M87851 Other osteonecrosis, right femur: Secondary | ICD-10-CM | POA: Diagnosis not present

## 2021-02-23 ENCOUNTER — Other Ambulatory Visit: Payer: Self-pay

## 2021-02-23 ENCOUNTER — Emergency Department (HOSPITAL_BASED_OUTPATIENT_CLINIC_OR_DEPARTMENT_OTHER)
Admission: EM | Admit: 2021-02-23 | Discharge: 2021-02-23 | Disposition: A | Discharge status: Home / Self Care | Payer: BC Managed Care – PPO | Attending: Emergency Medicine | Admitting: Emergency Medicine

## 2021-02-23 ENCOUNTER — Encounter (HOSPITAL_BASED_OUTPATIENT_CLINIC_OR_DEPARTMENT_OTHER): Payer: Self-pay | Admitting: *Deleted

## 2021-02-23 DIAGNOSIS — I10 Essential (primary) hypertension: Secondary | ICD-10-CM | POA: Insufficient documentation

## 2021-02-23 DIAGNOSIS — Z79899 Other long term (current) drug therapy: Secondary | ICD-10-CM | POA: Insufficient documentation

## 2021-02-23 DIAGNOSIS — Z87891 Personal history of nicotine dependence: Secondary | ICD-10-CM | POA: Diagnosis not present

## 2021-02-23 DIAGNOSIS — K5900 Constipation, unspecified: Secondary | ICD-10-CM | POA: Diagnosis not present

## 2021-02-23 DIAGNOSIS — K625 Hemorrhage of anus and rectum: Secondary | ICD-10-CM | POA: Diagnosis not present

## 2021-02-23 LAB — CBC WITH DIFFERENTIAL/PLATELET
Abs Immature Granulocytes: 0.09 10*3/uL — ABNORMAL HIGH (ref 0.00–0.07)
Basophils Absolute: 0.1 10*3/uL (ref 0.0–0.1)
Basophils Relative: 1 %
Eosinophils Absolute: 0.1 10*3/uL (ref 0.0–0.5)
Eosinophils Relative: 1 %
HCT: 46.9 % (ref 39.0–52.0)
Hemoglobin: 16.4 g/dL (ref 13.0–17.0)
Immature Granulocytes: 1 %
Lymphocytes Relative: 14 %
Lymphs Abs: 1.4 10*3/uL (ref 0.7–4.0)
MCH: 30.9 pg (ref 26.0–34.0)
MCHC: 35 g/dL (ref 30.0–36.0)
MCV: 88.3 fL (ref 80.0–100.0)
Monocytes Absolute: 0.8 10*3/uL (ref 0.1–1.0)
Monocytes Relative: 9 %
Neutro Abs: 7.3 10*3/uL (ref 1.7–7.7)
Neutrophils Relative %: 74 %
Platelets: 158 10*3/uL (ref 150–400)
RBC: 5.31 MIL/uL (ref 4.22–5.81)
RDW: 13.2 % (ref 11.5–15.5)
WBC: 9.7 10*3/uL (ref 4.0–10.5)
nRBC: 0 % (ref 0.0–0.2)

## 2021-02-23 LAB — URINALYSIS, MICROSCOPIC (REFLEX)

## 2021-02-23 LAB — COMPREHENSIVE METABOLIC PANEL
ALT: 20 U/L (ref 0–44)
AST: 18 U/L (ref 15–41)
Albumin: 3.9 g/dL (ref 3.5–5.0)
Alkaline Phosphatase: 61 U/L (ref 38–126)
Anion gap: 8 (ref 5–15)
BUN: 22 mg/dL (ref 8–23)
CO2: 20 mmol/L — ABNORMAL LOW (ref 22–32)
Calcium: 9.7 mg/dL (ref 8.9–10.3)
Chloride: 108 mmol/L (ref 98–111)
Creatinine, Ser: 1.13 mg/dL (ref 0.61–1.24)
GFR, Estimated: >60 mL/min (ref >60)
Glucose, Bld: 98 mg/dL (ref 70–99)
Potassium: 4 mmol/L (ref 3.5–5.1)
Sodium: 136 mmol/L (ref 135–145)
Total Bilirubin: 1 mg/dL (ref 0.3–1.2)
Total Protein: 6.8 g/dL (ref 6.5–8.1)

## 2021-02-23 LAB — PROTIME-INR
INR: 1 (ref 0.8–1.2)
Prothrombin Time: 13 seconds (ref 11.4–15.2)

## 2021-02-23 LAB — URINALYSIS, ROUTINE W REFLEX MICROSCOPIC
Bilirubin Urine: NEGATIVE
Glucose, UA: NEGATIVE mg/dL
Hgb urine dipstick: NEGATIVE
Ketones, ur: NEGATIVE mg/dL
Nitrite: NEGATIVE
Protein, ur: NEGATIVE mg/dL
Specific Gravity, Urine: 1.02 (ref 1.005–1.030)
pH: 6 (ref 5.0–8.0)

## 2021-02-23 LAB — OCCULT BLOOD X 1 CARD TO LAB, STOOL: Fecal Occult Bld: POSITIVE — AB

## 2021-02-23 MED ORDER — SENNOSIDES-DOCUSATE SODIUM 8.6-50 MG PO TABS: 1.0000 | ORAL_TABLET | Freq: Every day | ORAL | 0 refills | Status: DC

## 2021-02-23 NOTE — ED Notes (Signed)
Bladder scan showed 115m, pt still unable to void

## 2021-02-23 NOTE — ED Provider Notes (Signed)
[facility] EMERGENCY DEPARTMENT Provider Note   CSN: [id number] Arrival date & time: 02/23/21  1344     History Chief Complaint  Patient presents with   Constipation    Paul Phillips is a 70 y.o. male.  Presenting to ER with concern for constipation.  Also having pain with bowel movements.  Patient states that he has been having issues with constipation for a while, takes MiraLAX and this seems to have been helping him go more frequently.  Had small bowel movement this morning and had a good bowel movement yesterday.  Well having the bowel movement he said it seemed difficult and had a lot of pain.  Does not have any generalized abdominal pain at present.  No pain when not attempting to defecate.  Noted some blood while wiping.  Has not seen any blood in his stool.  HPI     Past Medical History:  Diagnosis Date   Arthritis    Dislocated thumb    GERD (gastroesophageal reflux disease)    Heart murmur    Hypertension     Patient Active Problem List   Diagnosis Date Noted   Bell's palsy 12/31/2020   Arthritis 06/22/2018   Sleep disturbance 06/22/2018   Essential hypertension 12/27/2017   Family history of abdominal aortic aneurysm (AAA) 12/10/2017   Former smoker 12/10/2017   Hypertriglyceridemia 07/07/2017   Colon polyps 11/16/2016   Morbid obesity with BMI of 40.0-44.9, adult ([place]) 06/19/2016    Past Surgical History:  Procedure Laterality Date   COLONOSCOPY  10/26/2016   TA       Family History  Problem Relation Age of Onset   Arthritis Mother    Diabetes Mother    Hearing loss Mother    Stroke Mother    Hearing loss Father    Heart disease Father    AAA (abdominal aortic aneurysm) Father    Aneurysm Sister 19       brain, died from aneurysm suddenly.    Heart attack Brother    Arthritis/Rheumatoid Maternal Grandmother    Brain cancer Maternal Grandfather    Colon cancer Neg Hx    Colon polyps Neg Hx    Esophageal cancer Neg Hx     Stomach cancer Neg Hx    Rectal cancer Neg Hx     Social History   Tobacco Use   Smoking status: Former    Packs/day: 1.00    Years: 30.00    Pack years: 30.00    Types: Cigarettes    Quit date: 07/07/1999    Years since quitting: 21.6   Smokeless tobacco: Never  Vaping Use   Vaping Use: Never used  Substance Use Topics   Alcohol use: Yes    Alcohol/week: 4.0 standard drinks    Types: 4 Cans of beer per week   Drug use: No    Home Medications Prior to Admission medications   Medication Sig Start Date End Date Taking? Authorizing Provider  senna-docusate (SENOKOT-S) 8.6-50 MG tablet Take 1 tablet by mouth daily. 02/23/21  Yes Lucrezia Starch, MD  atorvastatin (LIPITOR) 10 MG tablet Take 1 tablet (10 mg total) by mouth daily. 09/18/20   Kuneff, Renee A, DO  cyclobenzaprine (FLEXERIL) 10 MG tablet Take 1 tablet (10 mg total) by mouth 2 (two) times daily as needed for muscle spasms. 02/12/21   Kuneff, Renee A, DO  diclofenac (VOLTAREN) 75 MG EC tablet Take 1 tablet (75 mg total) by mouth 2 (two) times  daily. 12/30/20   Raoul Pitch, Renee A, DO  erythromycin ophthalmic ointment Place 1 application into the left eye at bedtime. 12/30/20   Kuneff, Renee A, DO  Famotidine (ZANTAC 360 PO) Take by mouth.    [provider]  HYDROcodone-acetaminophen (NORCO/VICODIN) 5-325 MG tablet Take 1-2 tablets by mouth every 6 (six) hours as needed for moderate pain. 02/17/21   McGowen, Adrian Blackwater, MD  lisinopril (ZESTRIL) 30 MG tablet Take 1 tablet (30 mg total) by mouth daily. 01/13/21   Kuneff, Renee A, DO  methylPREDNISolone (MEDROL DOSEPAK) 4 MG TBPK tablet Per package taper instruction 02/12/21   Kuneff, Renee A, DO  Multiple Vitamins-Minerals (MENS 50+ MULTI VITAMIN/MIN) TABS Take 1 tablet by mouth daily.    [provider]  vitamin B-12 (CYANOCOBALAMIN) 1000 MCG tablet Take 2,500 mcg by mouth daily.     [provider]    Allergies    Patient has no known  allergies.  Review of Systems   Review of Systems  Constitutional:  Negative for chills and fever.  HENT:  Negative for ear pain and sore throat.   Eyes:  Negative for pain and visual disturbance.  Respiratory:  Negative for cough and shortness of breath.   Cardiovascular:  Negative for chest pain and palpitations.  Gastrointestinal:  Positive for abdominal pain, blood in stool and constipation. Negative for vomiting.  Genitourinary:  Negative for dysuria and hematuria.  Musculoskeletal:  Negative for arthralgias and back pain.  Skin:  Negative for color change and rash.  Neurological:  Negative for seizures and syncope.  All other systems reviewed and are negative.  Physical Exam Updated Vital Signs BP (!) 132/56 (BP Location: Left Arm)   Pulse 74   Temp 98.8 F (37.1 C) (Oral)   Resp 16   Wt 99.8 kg   SpO2 99%   BMI 37.76 kg/m   Physical Exam Vitals and nursing note reviewed.  Constitutional:      Appearance: He is well-developed.  HENT:     Head: Normocephalic and atraumatic.  Eyes:     Conjunctiva/sclera: Conjunctivae normal.  Cardiovascular:     Rate and Rhythm: Normal rate and regular rhythm.     Heart sounds: No murmur heard. Pulmonary:     Effort: Pulmonary effort is normal. No respiratory distress.     Breath sounds: Normal breath sounds.  Abdominal:     Palpations: Abdomen is soft.     Tenderness: There is no abdominal tenderness.  Genitourinary:    Comments: RN chaperone No external hemorrhoid appreciated, no internal hemorrhoid appreciated, patient endorsed some pain during digital rectal exam, no obvious fissures noted Musculoskeletal:     Cervical back: Neck supple.  Skin:    General: Skin is warm and dry.  Neurological:     General: No focal deficit present.     Mental Status: He is alert.    ED Results / Procedures / Treatments   Labs (all labs ordered are listed, but only abnormal results are displayed) Labs Reviewed  CBC WITH  DIFFERENTIAL/PLATELET - Abnormal; Notable for the following components:      Result Value   Abs Immature Granulocytes 0.09 (*)    All other components within normal limits  COMPREHENSIVE METABOLIC PANEL - Abnormal; Notable for the following components:   CO2 20 (*)    All other components within normal limits  URINALYSIS, ROUTINE W REFLEX MICROSCOPIC - Abnormal; Notable for the following components:   Leukocytes,Ua TRACE (*)    All other  components within normal limits  URINALYSIS, MICROSCOPIC (REFLEX) - Abnormal; Notable for the following components:   Bacteria, UA RARE (*)    All other components within normal limits  OCCULT BLOOD X 1 CARD TO LAB, STOOL - Abnormal; Notable for the following components:   Fecal Occult Bld POSITIVE (*)    All other components within normal limits  PROTIME-INR  POC OCCULT BLOOD, ED    EKG None  Radiology No results found.  Procedures Procedures   Medications Ordered in ED Medications - No data to display  ED Course  I have reviewed the triage vital signs and the nursing notes.  Pertinent labs & imaging results that were available during my care of the patient were reviewed by me and considered in my medical decision making (see chart for details).  Clinical Course as of 02/23/21 2314  Sun Feb 23, 2021  1716 RN chaperone [RD]    Clinical Course User Index [RD] Lucrezia Starch, MD   MDM Rules/Calculators/A&P                           70 year old male presented to ER with concern for intermittent constipation, pain with defecation.  On exam he appears well and has a soft abdomen.  His basic labs are stable.  On rectal exam, no obvious hemorrhoids or fissures however he did have some pain on DRE.  Stool is brown but Hemoccult positive.  Suspect patient may have small fissures.  Recommend he follow-up with his gastroenterologist and his primary care doctor.  Recommended continuing bowel regimen, adding Peri-Colace to MiraLAX.  Discharged  home.  After the discussed management above, the patient was determined to be safe for discharge.  The patient was in agreement with this plan and all questions regarding their care were answered.  ED return precautions were discussed and the patient will return to the ED with any significant worsening of condition.  Final Clinical Impression(s) / ED Diagnoses Final diagnoses:  Constipation, unspecified constipation type  Blood per rectum    Rx / DC Orders ED Discharge Orders          Ordered    senna-docusate (SENOKOT-S) 8.6-50 MG tablet  Daily        02/23/21 1837             Lucrezia Starch, MD 02/23/21 2314

## 2021-02-23 NOTE — ED Triage Notes (Signed)
Pt is here for constipation.  He states that Friday he felt like he needed to have a bm and it felt like the stool was "too large to get out"pt then attempted to give himself an enema but only about half of a bottle would go in.  Pt then had a BM Saturday am. Pt reports that there was a large amount of BM on Saturday but today he again feels rectal pressure and this was so painful that he was afraid to attempt to poop on his own at home as it was very painful last time and there some blood.

## 2021-02-23 NOTE — Discharge Instructions (Addendum)
Please follow-up with your gastroenterologist.  If they are not able to see you within the next few days, please also follow-up with your primary care doctor.  If you have worsening bleeding from your rectum, general abdominal pain, vomiting, come back to ER for reassessment.  Recommend bowel regimen taking MiraLAX twice daily and also taking the newly prescribed Peri-Colace daily.

## 2021-02-24 ENCOUNTER — Telehealth: Payer: Self-pay | Admitting: Gastroenterology

## 2021-02-24 ENCOUNTER — Telehealth: Payer: Self-pay | Admitting: Family Medicine

## 2021-02-24 NOTE — Telephone Encounter (Signed)
Forms placed on PCP desk was received prior to pt appt. New forms was given to Dr. Anitra Lauth after pt apt.

## 2021-02-24 NOTE — Telephone Encounter (Signed)
Faxed back to [employer], fax confirmation received; both placed in bin for scan

## 2021-02-24 NOTE — Telephone Encounter (Signed)
Patient has been scheduled with Nicoletta Ba PA on 02/27/21 10:300.  His wife is aware

## 2021-02-24 NOTE — Telephone Encounter (Signed)
Pt was seen at the ED yesterday for constipation and blood per rectum. He was deferred to his GI for an appt. Pt's wife Perrin Smack is requesting a soon appt but nothing soon until 03/26/21 with APP. Pls call her.

## 2021-02-24 NOTE — Telephone Encounter (Signed)
FMLA for R hip pain filled out and put on Britt's desk. Signed:  Crissie Sickles, MD           02/24/2021

## 2021-02-24 NOTE — Telephone Encounter (Signed)
I have received FMLA papers for Paul Phillips.  I did not placed him out of work when I saw him.  Dr. Anitra Lauth had wrote him out for approximately 2 weeks when he saw him on 02/17/2021.   The FMLA paperwork received as the dates 02/17/2021 to 05/06/2021?  I am not certain what condition the FMLA paper is to cover?

## 2021-02-25 NOTE — Telephone Encounter (Signed)
Form was duplicate but completed and faxed back appropriately.

## 2021-02-26 DIAGNOSIS — M25551 Pain in right hip: Secondary | ICD-10-CM | POA: Diagnosis not present

## 2021-02-27 ENCOUNTER — Encounter: Payer: Self-pay | Admitting: Physician Assistant

## 2021-02-27 ENCOUNTER — Ambulatory Visit (INDEPENDENT_AMBULATORY_CARE_PROVIDER_SITE_OTHER): Payer: BC Managed Care – PPO | Admitting: Physician Assistant

## 2021-02-27 VITALS — BP 150/60 | HR 94 | Ht 65.0 in | Wt 213.0 lb

## 2021-02-27 DIAGNOSIS — K602 Anal fissure, unspecified: Secondary | ICD-10-CM | POA: Diagnosis not present

## 2021-02-27 DIAGNOSIS — K5900 Constipation, unspecified: Secondary | ICD-10-CM

## 2021-02-27 MED ORDER — AMBULATORY NON FORMULARY MEDICATION: 2 refills | Status: DC

## 2021-02-27 NOTE — Patient Instructions (Addendum)
If you are age 68 or older, your body mass index should be between 23-30. Your Body mass index is 35.45 kg/m. If this is out of the aforementioned range listed, please consider follow up with your Primary Care Provider. __________________________________________________________  The [facility] providers would like to encourage you to use [facility] to communicate with providers for non-urgent requests or questions.  Due to long hold times on the telephone, sending your provider a message by [facility] may be a faster and more efficient way to get a response.  Please allow 48 business hours for a response.  Please remember that this is for non-urgent requests.   Your provider has prescribed Diltiazem/Lidocain gel for you. Please follow the directions written on your prescription bottle or given to you specifically by your provider. Since this is a specialty medication and is not readily available at most local pharmacies, we have sent your prescription to:  We have sent a prescription for Diltiazem 2% gel to [facility] for you. Using your index finger, you should apply a small amount of medication inside the rectum up to your first knuckle/joint twice daily x 4 weeks.  [facility]'s information is below: Address: [address] [place], [place] [address]  [phone]  *Please DO NOT go directly from our office to pick up this medication! Give the pharmacy 1 day to process the prescription as this is compounded and takes time to make.  Use Miralax 1 capful in 8 ounces of water or juice twice daily and Senakot S 1 tablet at bedtime.  Follow a low gas diet.  Call the office and speak with a nurse if symptoms have not gotten significantly better in the next 2 weeks.  Thank you for entrusting me with your care and choosing [facility].  Amy Esterwood, PA-C

## 2021-02-27 NOTE — Progress Notes (Signed)
Subjective:    Patient ID: Paul Phillips, male    DOB: 04-04-1951, 70 y.o.   MRN: [id number]  HPI Paul Phillips "Paul Phillips" is a 70 year old white male established with Paul Phillips who comes in today after recent ER visit on 02/23/2021 where he presented with rectal pain and rectal bleeding as well as constipation. Exam per ER physician with no definite etiology for symptoms and no definite fissure or hemorrhoids noted. Patient says prior to onset of his current symptoms he had had a significant bout of constipation.  He says he kept having the sensation that he needed to have a bowel movement but was unable.  He tried to do some manual removal of stool which was not very successful.  Later that night he was awakened and then finally had a large bowel movement.  He believes that he passed hard stool followed by softer stool.  At that time he had been taking some hydrocodone as he has been having a lot of hip pain and is to see his orthopedist actually tomorrow.  He had been taking a stool softener initially with the pain medication which should have been helpful.  He says he had gone on vacation and had been camping and so stopped taking the stool softener.  He says his hip actually flared up more during that trip.  Now on a muscle relaxer and anti-inflammatory.  After the ER visit he had been started on MiraLAX which she says he is taking 3 times daily which is producing very soft pudding-like stools.  He had been having 8 out of 10 rectal pain at 1 point which she says is now down to 2 out of 10 today.  He feels the rectal pain is located on the right side.  He has been noticing some blood on the tissue with wiping but no gross blood in the stool.  Last colonoscopy April 2021 done for history of adenomatous colon polyps.  He was noted to have multiple diverticuli, grade 1 internal hemorrhoids and no polyps at that time.  Indicated for 5-year interval follow-up.  Review of Systems. Pertinent positive and  negative review of systems were noted in the above HPI section.  All other review of systems was otherwise negative.   Outpatient Encounter Medications as of 02/27/2021  Medication Sig   AMBULATORY NON FORMULARY MEDICATION Medication Name: Diltiazem 2%/Lidocaine 5% gel- Apply into the rectum 3-4 times daily   atorvastatin (LIPITOR) 10 MG tablet Take 1 tablet (10 mg total) by mouth daily.   cyclobenzaprine (FLEXERIL) 10 MG tablet Take 1 tablet (10 mg total) by mouth 2 (two) times daily as needed for muscle spasms.   diclofenac (VOLTAREN) 75 MG EC tablet Take 1 tablet (75 mg total) by mouth 2 (two) times daily.   erythromycin ophthalmic ointment Place 1 application into the left eye at bedtime.   Famotidine (ZANTAC 360 PO) Take by mouth.   lisinopril (ZESTRIL) 30 MG tablet Take 1 tablet (30 mg total) by mouth daily.   Multiple Vitamins-Minerals (MENS 50+ MULTI VITAMIN/MIN) TABS Take 1 tablet by mouth daily.   senna-docusate (SENOKOT-S) 8.6-50 MG tablet Take 1 tablet by mouth daily.   vitamin B-12 (CYANOCOBALAMIN) 1000 MCG tablet Take 2,500 mcg by mouth daily.    [DISCONTINUED] HYDROcodone-acetaminophen (NORCO/VICODIN) 5-325 MG tablet Take 1-2 tablets by mouth every 6 (six) hours as needed for moderate pain.   [DISCONTINUED] methylPREDNISolone (MEDROL DOSEPAK) 4 MG TBPK tablet Per package taper instruction   No facility-administered encounter  medications on file as of 02/27/2021.   No Known Allergies Patient Active Problem List   Diagnosis Date Noted   Bell's palsy 12/31/2020   Arthritis 06/22/2018   Sleep disturbance 06/22/2018   Essential hypertension 12/27/2017   Family history of abdominal aortic aneurysm (AAA) 12/10/2017   Former smoker 12/10/2017   Hypertriglyceridemia 07/07/2017   Colon polyps 11/16/2016   Morbid obesity with BMI of 40.0-44.9, adult ([place]) 06/19/2016   Social History   Socioeconomic History   Marital status: Married    Spouse name: Paul Phillips   Number of children: 1    Years of education: 14   Highest education level: Not on file  Occupational History   Occupation: Clinical biochemist  Tobacco Use   Smoking status: Former    Packs/day: 1.00    Years: 30.00    Pack years: 30.00    Types: Cigarettes    Quit date: 07/07/1999    Years since quitting: 21.6   Smokeless tobacco: Never  Vaping Use   Vaping Use: Never used  Substance and Sexual Activity   Alcohol use: Yes    Alcohol/week: 4.0 standard drinks    Types: 4 Cans of beer per week   Drug use: No   Sexual activity: Yes    Partners: Female    Comment: married  Other Topics Concern   Not on file  Social History Narrative   Married to Paul Phillips. 1 adult child Paul Phillips.   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    Former smoker, quit 2001   Drinks caffeine, takes a daily vitamin.   Wear seatbelt. Smoke detector in the home. Firearms in the home.   safe in his relationships.   Right handed    Social Determinants of Health   Financial Resource Strain: Not on file  Food Insecurity: Not on file  Transportation Needs: Not on file  Physical Activity: Not on file  Stress: Not on file  Social Connections: Not on file  Intimate Partner Violence: Not on file    [name]'s family history includes AAA (abdominal aortic aneurysm) in his father; Aneurysm (age of onset: 17) in his sister; Arthritis in his mother; Arthritis/Rheumatoid in his maternal grandmother; Brain cancer in his maternal grandfather; Diabetes in his mother; Hearing loss in his father and mother; Heart attack in his brother; Heart disease in his father; Stroke in his mother.      Objective:    Vitals:   02/27/21 1011  BP: (!) 150/60  Pulse: 94    Physical Exam Well-developed well-nourished white male in no acute distress.  Ambulating with a cane Weight, 213 BMI 35.4  HEENT; nontraumatic normocephalic, EOMI, PE R LA, sclera anicteric. Oropharynx; not examined today Neck; supple, no JVD Cardiovascular; regular rate and  rhythm with S1-S2, no murmur rub or gallop Pulmonary; Clear bilaterally Abdomen; soft, nontender, nondistended, no palpable mass or hepatosplenomegaly, bowel sounds are active Rectal; no external lesions noted or external hemorrhoids, patient is exquisitely tender to digital exam, and unable to do a anoscopy due to patient discomfort.  On digital exam there is a an anal fissure on the right. Skin; benign exam, no jaundice rash or appreciable lesions Extremities; no clubbing cyanosis or edema skin warm and dry Neuro/Psych; alert and oriented x4, grossly nonfocal mood and affect appropriate        Assessment & Phillips:   #81 70 year old white male with right-sided anal fissure with significant anal rectal pain he has actually improved over the past few days since  initial onset though he is still exquisitely tender on exam.  Episode exacerbated by constipation and straining in setting of use of hydrocodone for hip pain  #2 history of adenomatous colon polyps-up-to-date with colonoscopy last done April 2021 no recurrent polyps and indicated for 5-year interval follow-up #3.  Diverticulosis #4.  Grade 1 internal hemorrhoids on prior colonoscopy #5.  Hypertension #6.  Chronic right hip pain-undergoing evaluation and may require hip replacement  Phillips; start lidocaine 5% compounded with diltiazem 2% apply into the anus 1/2 to 1 inch 3-4 times daily x1 month.  If symptoms have resolved may discontinue if having some persistence of symptoms would continue another 1 month. Increase water intake to 60 ounces per day Try to avoid use of narcotics for pain due to constipation Continue MiraLAX, currently taking 3 times daily patient is advised that he may decrease to twice daily and titrate that to continue to have soft stools daily Continue Senokot S1 p.o. nightly. Have asked him to call back in 2 weeks if he has not had significant improvement in symptoms.    Sheriann Newmann S Toma Arts PA-C 02/27/2021   Cc:  Howard Pouch A, DO

## 2021-02-27 NOTE — Progress Notes (Signed)
Reviewed and agree with management plan.  Leisl Spurrier T. Earvin Blazier, MD FACG 

## 2021-02-28 DIAGNOSIS — M1611 Unilateral primary osteoarthritis, right hip: Secondary | ICD-10-CM | POA: Diagnosis not present

## 2021-03-04 ENCOUNTER — Telehealth: Payer: Self-pay

## 2021-03-04 NOTE — Telephone Encounter (Signed)
Patient calling regarding a form that should be arriving sometime today via fax from [employer] to extend patient leave from work.  Currently letter states out of work until 8/31.  Patient is very confused by all of this.  He thinks he is getting the run around, he states nobody seems to know what is going on.  I apologized if anything has happened on our part.

## 2021-03-04 NOTE — Telephone Encounter (Signed)
Wife calling and upset that Dr. Raoul Pitch does not have available opening for patient to come in for surgical clearance. Appt is scheduled for 9/20. I explained to patient Dr. Raoul Pitch will be out of office beginning at noon on 9/1, returning to office on 9/12.  She said "well, that is not going to work for [place].  My husband needs an appt sooner, so he can have his surgery.  Why cant she( Dr. Raliegh Ip) just fill out clearance form, why does she have to see him to sign form?.  Why cant Dr. Anitra Lauth fill out paper?"  I apologized to her, and told her I know how she felt, and what he (they) are experiencing. Patient wife said that [facility] told patient that they could not set up appt for surgery until surgical clearance form is returned to them. I told her that was incorrect, he can be scheduled for surgery now, she stated he could be scheduled in September sometime if they had form tomorrow. (I will be calling to verify this again tomorrow with EO- similar situation with another patient happened like this last week, conclusion was [facility] DOES NOT have to have clearance form to schedule surgery)   I will continue with documentation after I speak with Dr. Rod Can assistant OR surgery scheduler.

## 2021-03-04 NOTE — Telephone Encounter (Signed)
Awaiting form

## 2021-03-04 NOTE — Telephone Encounter (Signed)
Please assist with this. Let me know if there is anything I can do to help.

## 2021-03-04 NOTE — Telephone Encounter (Signed)
Surgical clearance forms received on 8/30. Patient has been scheduled on 9/20 to surgical clearance appt. Forms have been placed on PCP desk.

## 2021-03-04 NOTE — Telephone Encounter (Signed)
Received FMLA forms from [employer] for leave extension past original continuous leave dates of 08/15-08/3, please see attached notes from [facility] regarding surgery.  Please review and advise, forms are not complete

## 2021-03-05 ENCOUNTER — Encounter: Payer: Self-pay | Admitting: Family Medicine

## 2021-03-05 NOTE — Telephone Encounter (Signed)
I need the ortho visit office note.  Dr. Raoul Pitch showed it to me yesterday at the end of the day so she may still have it or it is in a scan pile. It is not in media section of EMR yet. -thx

## 2021-03-05 NOTE — Telephone Encounter (Signed)
OK paperwork completed and placed on your desk.

## 2021-03-05 NOTE — Telephone Encounter (Signed)
Scheduled appt on 9/12 at 1pm

## 2021-03-05 NOTE — Telephone Encounter (Addendum)
I will be out of office next week on vacation. I have no openings prior to vacation to accommodate their orthopedist's requirement. This requirement for surgical clearance is the orthopedist's request. Surgical clearance form, generated by his orthopedist ,asks pt to have complete general exam and labs.   In order to meet his orthopedist's requirements and "surgically clear" him all the above must be completed.   Please help them understand this is more than signature on a paper allowing him to have surgery. He can have the surgery and the decision to have this surgery is between the patient and his Surgeon> and it is the insurance that approves.  Furthermore, He can be placed on 9/12 at 1pm slot if desiring.  Maybe ortho could give him a surgical date- knowing he has a surgical clearance appt set up for 9/12?

## 2021-03-05 NOTE — Telephone Encounter (Signed)
[facility] note on your desk with FMLA forms for review

## 2021-03-05 NOTE — Telephone Encounter (Signed)
Spoke with pt regarding surgical clearance forms and Dr. Lucita Lora message. Pt agreed to new appt and verbalized understanding. Pt will call ortho to see if new appt helps.   Please assist with scheduling pt.

## 2021-03-06 NOTE — Telephone Encounter (Signed)
Forms faxed back, fax confirmation received.

## 2021-03-17 ENCOUNTER — Other Ambulatory Visit: Payer: Self-pay

## 2021-03-17 ENCOUNTER — Ambulatory Visit (INDEPENDENT_AMBULATORY_CARE_PROVIDER_SITE_OTHER): Payer: BC Managed Care – PPO | Admitting: Family Medicine

## 2021-03-17 ENCOUNTER — Encounter: Payer: Self-pay | Admitting: Family Medicine

## 2021-03-17 VITALS — BP 113/63 | HR 84 | Temp 98.4°F | Wt 215.2 lb

## 2021-03-17 DIAGNOSIS — E781 Pure hyperglyceridemia: Secondary | ICD-10-CM

## 2021-03-17 DIAGNOSIS — Z131 Encounter for screening for diabetes mellitus: Secondary | ICD-10-CM

## 2021-03-17 DIAGNOSIS — I10 Essential (primary) hypertension: Secondary | ICD-10-CM | POA: Diagnosis not present

## 2021-03-17 DIAGNOSIS — Z01818 Encounter for other preprocedural examination: Secondary | ICD-10-CM | POA: Diagnosis not present

## 2021-03-17 DIAGNOSIS — G510 Bell's palsy: Secondary | ICD-10-CM

## 2021-03-17 DIAGNOSIS — M87051 Idiopathic aseptic necrosis of right femur: Secondary | ICD-10-CM | POA: Diagnosis not present

## 2021-03-17 DIAGNOSIS — Z87891 Personal history of nicotine dependence: Secondary | ICD-10-CM | POA: Diagnosis not present

## 2021-03-17 DIAGNOSIS — Z6841 Body Mass Index (BMI) 40.0 and over, adult: Secondary | ICD-10-CM

## 2021-03-17 LAB — CBC WITH DIFFERENTIAL/PLATELET
Basophils Absolute: 0 10*3/uL (ref 0.0–0.1)
Basophils Relative: 0.5 % (ref 0.0–3.0)
Eosinophils Absolute: 0.1 10*3/uL (ref 0.0–0.7)
Eosinophils Relative: 1.1 % (ref 0.0–5.0)
HCT: 45.3 % (ref 39.0–52.0)
Hemoglobin: 15.4 g/dL (ref 13.0–17.0)
Lymphocytes Relative: 22.6 % (ref 12.0–46.0)
Lymphs Abs: 1.7 10*3/uL (ref 0.7–4.0)
MCHC: 34 g/dL (ref 30.0–36.0)
MCV: 88.8 fl (ref 78.0–100.0)
Monocytes Absolute: 0.8 10*3/uL (ref 0.1–1.0)
Monocytes Relative: 10.2 % (ref 3.0–12.0)
Neutro Abs: 5 10*3/uL (ref 1.4–7.7)
Neutrophils Relative %: 65.6 % (ref 43.0–77.0)
Platelets: 179 10*3/uL (ref 150.0–400.0)
RBC: 5.1 Mil/uL (ref 4.22–5.81)
RDW: 14 % (ref 11.5–15.5)
WBC: 7.6 10*3/uL (ref 4.0–10.5)

## 2021-03-17 LAB — HEMOGLOBIN A1C: Hgb A1c MFr Bld: 5.6 % (ref 4.6–6.5)

## 2021-03-17 NOTE — Patient Instructions (Signed)
Great to see you today.  I have refilled the medication(s) we provide.   If labs were collected, we will inform you of lab results once received either by echart message or telephone call.   - echart message- for normal results that have been seen by the patient already.   - telephone call: abnormal results or if patient has not viewed results in their echart.   We will send forms as soon as we receive the results.

## 2021-03-17 NOTE — Progress Notes (Signed)
This visit occurred during the SARS-CoV-2 public health emergency.  Safety protocols were in place, including screening questions prior to the visit, additional usage of staff PPE, and extensive cleaning of exam room while observing appropriate contact time as indicated for disinfecting solutions.    Paul, Phillips 08-28-1950, 70 y.o., male MRN: [id number] Patient Care Team    Relationship Specialty Notifications Start End  Ma Hillock, DO PCP - General Family Medicine  06/19/16   Orbie Hurst, MD Referring Physician Dermatology  12/21/18   Rondel Jumbo, PA-C  Neurology  01/08/21   Pieter Partridge, [place] Physician Neurology  01/08/21     Chief Complaint  Patient presents with   Follow-up    Surgical clearance f/u.     Subjective: Pt presents for an OV with request of "surgical clearance " by his orthopedic team.  Patient is to undergo right total hip arthroplasty within the next 2-4 weeks secondary to avascular necrosis/pain. Procedure: Right hip arthroplasty Indication: Avascular necrosis/pain Anesthesia: Spinal Surgery type risk:Intermediate risk= orthopedics Prior anesthesia complications:denies any complications-only past anesthesia of light sedation for his colonoscopy. Family history of prior anesthesia complications:no complications known Cardiac: Patient denies any past cardiac history of CBD, PAD, stroke, MI, aortic stenosis or congenital malformations.   - METs: > 4 METS tolerated Pulmonary: FORMER smoker- stable.  Patient denies any dyspnea.  Not prescribed inhalers, oxygen or CPAP. Endocrine: History of endocrine disorders.  A1c within normal limits 03/17/2021. Obesity:Body mass index is 35.81 kg/m. Chronic kidney disease: No history of kidney disease.  GFR greater than 60. Chronic med that needs to be continued: Blood pressure regimen-lisinopril Anticoagulation: Patient not prescribed chronic anticoagulation.    Depression screen [facility] 2/9  07/15/2020 06/26/2019 12/21/2018 12/10/2017 07/07/2017  Decreased Interest 0 0 0 0 0  Down, Depressed, Hopeless 0 0 0 - 0  PHQ - 2 Score 0 0 0 0 0    No Known Allergies Social History   Social History Narrative   Married to [place]. 1 adult child [place].   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    Former smoker, quit 2001   Drinks caffeine, takes a daily vitamin.   Wear seatbelt. Smoke detector in the home. Firearms in the home.   safe in his relationships.   Right handed    Past Medical History:  Diagnosis Date   Dislocated thumb    GERD (gastroesophageal reflux disease)    Heart murmur    Hypertension    Osteoarthritis of right hip    Osteoarthritis, multiple sites    hands and hips   Osteonecrosis of right hip (HCC)    pt to get hip replacement by [facility] as of 03/2021   Sleep disturbance 06/22/2018   Past Surgical History:  Procedure Laterality Date   COLONOSCOPY  10/26/2016   TA   Family History  Problem Relation Age of Onset   Arthritis Mother    Diabetes Mother    Hearing loss Mother    Stroke Mother    Hearing loss Father    Heart disease Father    AAA (abdominal aortic aneurysm) Father    Aneurysm Sister 59       brain, died from aneurysm suddenly.    Heart attack Brother    Arthritis/Rheumatoid Maternal Grandmother    Brain cancer Maternal Grandfather    Colon cancer Neg Hx    Colon polyps Neg Hx    Esophageal cancer Neg Hx  Stomach cancer Neg Hx    Rectal cancer Neg Hx    Allergies as of 03/17/2021   No Known Allergies      Medication List        Accurate as of March 17, 2021 11:59 PM. If you have any questions, ask your nurse or doctor.          STOP taking these medications    erythromycin ophthalmic ointment Stopped by: Howard Pouch, DO   predniSONE 10 MG tablet Commonly known as: DELTASONE Stopped by: Howard Pouch, DO       TAKE these medications    AMBULATORY NON FORMULARY MEDICATION Medication Name:  Diltiazem 2%/Lidocaine 5% gel- Apply into the rectum 3-4 times daily   atorvastatin 10 MG tablet Commonly known as: LIPITOR Take 1 tablet (10 mg total) by mouth daily.   cyclobenzaprine 10 MG tablet Commonly known as: FLEXERIL Take 1 tablet (10 mg total) by mouth 2 (two) times daily as needed for muscle spasms.   diclofenac 75 MG EC tablet Commonly known as: VOLTAREN Take 1 tablet (75 mg total) by mouth 2 (two) times daily.   lisinopril 30 MG tablet Commonly known as: ZESTRIL Take 1 tablet (30 mg total) by mouth daily.   Mens 50+ Multi Vitamin/Min Tabs Take 1 tablet by mouth daily.   senna-docusate 8.6-50 MG tablet Commonly known as: Senokot-S Take 1 tablet by mouth daily.   vitamin B-12 1000 MCG tablet Commonly known as: CYANOCOBALAMIN Take 2,500 mcg by mouth daily.   ZANTAC 360 PO Take by mouth.        All past medical history, surgical history, allergies, family history, immunizations andmedications were updated in the EMR today and reviewed under the history and medication portions of their EMR.     ROS: Negative, with the exception of above mentioned in HPI   Objective:  BP 113/63   Pulse 84   Temp 98.4 F (36.9 C) (Temporal)   Wt 215 lb 3.2 oz (97.6 kg)   SpO2 97%   BMI 35.81 kg/m  Body mass index is 35.81 kg/m. Gen: Afebrile. No acute distress. Nontoxic in appearance, well developed, well nourished.  Obese male. HENT: AT. [place]. Bilateral TM visualized without erythema or bulging. MMM, no oral lesions. Bilateral nares without erythema, swelling or drainage. Throat without erythema or exudates.  No cough.  No hoarseness. Eyes:Pupils Equal Round Reactive to light, Extraocular movements intact,  Conjunctiva without redness, discharge or icterus. Neck/lymp/endocrine: Supple, no lymphadenopathy CV: RRR no murmur, no edema Chest: CTAB, no wheeze or crackles. Good air movement, normal resp effort.  Abd: Soft. NTND. BS present.  No masses palpated. No rebound or  guarding.  Skin: No rashes, purpura or petechiae.  Neuro: Mild limp. PERLA. EOMi. Alert. Oriented x3.  Left CN 7 with chronic diminishment status post Bell's palsy. Psych: Normal affect, dress and demeanor. Normal speech. Normal thought content and judgment.  No results found. No results found. Results for orders placed or performed in visit on 03/17/21 (from the past 24 hour(s))  Hemoglobin A1c     Status: None   Collection Time: 03/17/21  1:49 PM  Result Value Ref Range   Hgb A1c MFr Bld 5.6 4.6 - 6.5 %  CBC w/Diff     Status: None   Collection Time: 03/17/21  1:49 PM  Result Value Ref Range   WBC 7.6 4.0 - 10.5 K/uL   RBC 5.10 4.22 - 5.81 Mil/uL   Hemoglobin 15.4 13.0 - 17.0 g/dL  HCT 45.3 39.0 - 52.0 %   MCV 88.8 78.0 - 100.0 fl   MCHC 34.0 30.0 - 36.0 g/dL   RDW 14.0 11.5 - 15.5 %   Platelets 179.0 150.0 - 400.0 K/uL   Neutrophils Relative % 65.6 43.0 - 77.0 %   Lymphocytes Relative 22.6 12.0 - 46.0 %   Monocytes Relative 10.2 3.0 - 12.0 %   Eosinophils Relative 1.1 0.0 - 5.0 %   Basophils Relative 0.5 0.0 - 3.0 %   Neutro Abs 5.0 1.4 - 7.7 K/uL   Lymphs Abs 1.7 0.7 - 4.0 K/uL   Monocytes Absolute 0.8 0.1 - 1.0 K/uL   Eosinophils Absolute 0.1 0.0 - 0.7 K/uL   Basophils Absolute 0.0 0.0 - 0.1 K/uL    Assessment/Plan: Paul Phillips is a 70 y.o. male present for OV for  Pre-operative clearance/Avascular necrosis of bone of right hip (HCC) To the best of my knowledge and per patients reported PMH, there is not a medical contraindication for undergoing elective surgery. He is of low/average risk for medical/cardiac complications at [facility].  Avoid NSAID prior to procedure, per orthopedic team instruction.  Patient understands the purpose of preoperative visit is to attempt to minimize surgical complications and communicate to surgical team chronic conditions and management. No patient is free of risk when undergoing a procedure. The decision about whether to proceed  with the operation belongs to the surgeon and the patient. Patient's chronic conditions have been stable.  He is not anemic.  He is not a diabetic.  His BMI is in the obese range, but less than 40 per requirement. Obesity:Body mass index is 35.81 kg/m. Essential hypertension-stable.  Continue lisinopril 30 mg daily Hypertriglyceridemia-stable.  Can hold Lipitor prior to surgery and restart thereafter per surgery team Former smoker-stable.  No known diminished lung function.  Not prescribed inhalers.  Quit smoking greater than 20 years ago. Diabetes mellitus screening: Hemoglobin A1c 5.6-9/06/2021. Bell's palsy: Present since May 2022.  Unknown cause.  Steadily improving over time.  There is still mild diminishment present left side of face, mostly affecting the buccal and mandibular branches-no difficulty swallowing at this time reported.   Reviewed expectations re: course of current medical issues. Discussed self-management of symptoms. Outlined signs and symptoms indicating need for more acute intervention. Patient verbalized understanding and all questions were answered. Patient received an After-Visit Summary.    Orders Placed This Encounter  Procedures   Hemoglobin A1c   CBC w/Diff   No orders of the defined types were placed in this encounter.  Referral Orders  No referral(s) requested today     Note is dictated utilizing voice recognition software. Although note has been proof read prior to signing, occasional typographical errors still can be missed. If any questions arise, please do not hesitate to call for verification.   electronically signed by:  Howard Pouch, DO  [place]

## 2021-03-18 ENCOUNTER — Encounter: Payer: Self-pay | Admitting: Family Medicine

## 2021-03-18 ENCOUNTER — Telehealth: Payer: Self-pay | Admitting: Family Medicine

## 2021-03-18 DIAGNOSIS — M87051 Idiopathic aseptic necrosis of right femur: Secondary | ICD-10-CM | POA: Insufficient documentation

## 2021-03-18 NOTE — Telephone Encounter (Signed)
Please fax over perioperative risk evaluation form and copy of office visit note from 03/17/2021 with labs.  To his orthopedic team ASAP.  Patient is attempting to be scheduled within the next 2 weeks.  Also, please send copy to patient.  Surgical clearance form placed in [facility] work basket.

## 2021-03-18 NOTE — Telephone Encounter (Signed)
All forms faxed and clearance form mailed

## 2021-03-25 ENCOUNTER — Ambulatory Visit: Payer: BC Managed Care – PPO | Admitting: Family Medicine

## 2021-04-01 DIAGNOSIS — M1611 Unilateral primary osteoarthritis, right hip: Secondary | ICD-10-CM | POA: Diagnosis not present

## 2021-04-11 DIAGNOSIS — M1611 Unilateral primary osteoarthritis, right hip: Secondary | ICD-10-CM | POA: Diagnosis not present

## 2021-04-11 HISTORY — PX: OTHER SURGICAL HISTORY: SHX169

## 2021-04-28 DIAGNOSIS — Z96641 Presence of right artificial hip joint: Secondary | ICD-10-CM | POA: Diagnosis not present

## 2021-04-28 DIAGNOSIS — Z471 Aftercare following joint replacement surgery: Secondary | ICD-10-CM | POA: Diagnosis not present

## 2021-05-26 DIAGNOSIS — Z96641 Presence of right artificial hip joint: Secondary | ICD-10-CM | POA: Diagnosis not present

## 2021-05-26 DIAGNOSIS — Z471 Aftercare following joint replacement surgery: Secondary | ICD-10-CM | POA: Diagnosis not present

## 2021-07-21 ENCOUNTER — Ambulatory Visit (INDEPENDENT_AMBULATORY_CARE_PROVIDER_SITE_OTHER): Payer: BC Managed Care – PPO | Admitting: Family Medicine

## 2021-07-21 ENCOUNTER — Other Ambulatory Visit: Payer: Self-pay

## 2021-07-21 ENCOUNTER — Encounter: Payer: Self-pay | Admitting: Family Medicine

## 2021-07-21 VITALS — BP 121/66 | HR 100 | Temp 98.0°F | Ht 64.96 in | Wt 238.2 lb

## 2021-07-21 DIAGNOSIS — M87051 Idiopathic aseptic necrosis of right femur: Secondary | ICD-10-CM | POA: Diagnosis not present

## 2021-07-21 DIAGNOSIS — E781 Pure hyperglyceridemia: Secondary | ICD-10-CM | POA: Diagnosis not present

## 2021-07-21 DIAGNOSIS — Z8249 Family history of ischemic heart disease and other diseases of the circulatory system: Secondary | ICD-10-CM | POA: Diagnosis not present

## 2021-07-21 DIAGNOSIS — Z87891 Personal history of nicotine dependence: Secondary | ICD-10-CM | POA: Diagnosis not present

## 2021-07-21 DIAGNOSIS — G510 Bell's palsy: Secondary | ICD-10-CM

## 2021-07-21 DIAGNOSIS — Z6841 Body Mass Index (BMI) 40.0 and over, adult: Secondary | ICD-10-CM

## 2021-07-21 DIAGNOSIS — Z125 Encounter for screening for malignant neoplasm of prostate: Secondary | ICD-10-CM | POA: Diagnosis not present

## 2021-07-21 DIAGNOSIS — Z131 Encounter for screening for diabetes mellitus: Secondary | ICD-10-CM | POA: Diagnosis not present

## 2021-07-21 DIAGNOSIS — K635 Polyp of colon: Secondary | ICD-10-CM | POA: Diagnosis not present

## 2021-07-21 DIAGNOSIS — Z0000 Encounter for general adult medical examination without abnormal findings: Secondary | ICD-10-CM

## 2021-07-21 DIAGNOSIS — I10 Essential (primary) hypertension: Secondary | ICD-10-CM

## 2021-07-21 MED ORDER — DICLOFENAC SODIUM 75 MG PO TBEC: 75.0000 mg | DELAYED_RELEASE_TABLET | Freq: Two times a day (BID) | ORAL | 1 refills | Status: DC

## 2021-07-21 MED ORDER — LISINOPRIL 30 MG PO TABS: 30.0000 mg | ORAL_TABLET | Freq: Every day | ORAL | 1 refills | Status: DC

## 2021-07-21 MED ORDER — ATORVASTATIN CALCIUM 10 MG PO TABS: 10.0000 mg | ORAL_TABLET | Freq: Every day | ORAL | 3 refills | Status: DC

## 2021-07-21 NOTE — Progress Notes (Signed)
This visit occurred during the SARS-CoV-2 public health emergency.  Safety protocols were in place, including screening questions prior to the visit, additional usage of staff PPE, and extensive cleaning of exam room while observing appropriate contact time as indicated for disinfecting solutions.    Patient ID: Paul Phillips, male  DOB: September 02, 1950, 71 y.o.   MRN: [id number] Patient Care Team    Relationship Specialty Notifications Start End  Ma Hillock, DO PCP - General Family Medicine  06/19/16   Orbie Hurst, MD Referring Physician Dermatology  12/21/18   Rondel Jumbo, PA-C  Neurology  01/08/21   Pieter Partridge, DO Consulting Physician Neurology  01/08/21     Chief Complaint  Patient presents with   Annual Exam    Pt is fasting    Subjective: Paul Phillips is a 71 y.o. male present for [place]. All past medical history, surgical history, allergies, family history, immunizations, medications and social history were updated in the electronic medical record today. All recent labs, ED visits and hospitalizations within the last year were reviewed.  Health maintenance:  Colonoscopy: Completed 2021, by Dr. Fuller Plan. Multiple polyps, 5 year follow-up recommended.  Immunizations:  tdap UTD 2017, influenza UTD  2022,PNA series completed,covid series completed with booster,  shingrix series completed.  Infectious disease screening: HIV and Hep C negative (completed) PSA:  Lab Results  Component Value Date   PSA 0.93 07/15/2020   PSA 0.79 06/26/2019   PSA 1.2 07/07/2017  , pt was counseled on prostate cancer screenings. Collected today Assistive device: none Oxygen HYI:FOYD Patient has a Dental home. Hospitalizations/ED visits: reviewed  HTN/HLD/Morbid obesity/thrombocytopenia: Pt reports compliance with  with  lisnopril 30 mg QD. Patient denies chest pain, shortness of breath, dizziness or lower extremity edema.  Pt takes a daily baby ASA. Pt is  prescribed  statin Diet: Started watching his diet very closely. Exercise: Is exercising routinely.   RF: Hypertension, hypertriglyceridemia, former smoker, strong family history of heart disease, stroke and aneurysms   Arthritis:  Pt reports voltaren is  still working good for him.     Depression screen [facility] 2/9 07/21/2021 07/15/2020 06/26/2019 12/21/2018 12/10/2017  Decreased Interest 0 0 0 0 0  Down, Depressed, Hopeless 0 0 0 0 -  PHQ - 2 Score 0 0 0 0 0  Altered sleeping 2 - - - -  Tired, decreased energy 0 - - - -  Change in appetite 0 - - - -  Feeling bad or failure about yourself  0 - - - -  Trouble concentrating 0 - - - -  Moving slowly or fidgety/restless 0 - - - -  Suicidal thoughts 0 - - - -  PHQ-9 Score 2 - - - -   GAD 7 : Generalized Anxiety Score 07/21/2021  Nervous, Anxious, on Edge 0  Control/stop worrying 0  Worry too much - different things 0  Trouble relaxing 0  Restless 0  Easily annoyed or irritable 0  Afraid - awful might happen 0  Total GAD 7 Score 0         Fall Risk  01/08/2021 07/15/2020 04/02/2020 12/21/2018 12/10/2017  Falls in the past year? 0 0 0 0 No  Number falls in past yr: 0 0 0 - -  Injury with Fall? 0 0 0 - -  Follow up - - Falls evaluation completed - -    Immunization History  Administered Date(s) Administered   Hepatitis B, ped/adol 09/22/2013  Influenza-Unspecified 04/05/2017, 04/26/2018, 03/07/2019, 04/05/2020, 05/06/2021   PFIZER(Purple Top)SARS-COV-2 Vaccination 09/03/2019, 10/03/2019, 06/24/2020   Pneumococcal Conjugate-13 07/03/2016   Pneumococcal Polysaccharide-23 06/22/2018   Tdap 07/03/2016   Zoster Recombinat (Shingrix) 07/15/2020, 11/08/2020     Past Medical History:  Diagnosis Date   Dislocated thumb    GERD (gastroesophageal reflux disease)    Heart murmur    Hypertension    Osteoarthritis of right hip    Osteoarthritis, multiple sites    hands and hips   Osteonecrosis of right hip ([place])    pt to get hip replacement by  [facility] as of 03/2021   Sleep disturbance 06/22/2018   No Known Allergies Past Surgical History:  Procedure Laterality Date   COLONOSCOPY  10/26/2016   TA   right total hip arthroplasty Right 04/11/2021   Family History  Problem Relation Age of Onset   Arthritis Mother    Diabetes Mother    Hearing loss Mother    Stroke Mother    Hearing loss Father    Heart disease Father    AAA (abdominal aortic aneurysm) Father    Aneurysm Sister 36       brain, died from aneurysm suddenly.    Heart attack Brother    Arthritis/Rheumatoid Maternal Grandmother    Brain cancer Maternal Grandfather    Colon cancer Neg Hx    Colon polyps Neg Hx    Esophageal cancer Neg Hx    Stomach cancer Neg Hx    Rectal cancer Neg Hx    Social History   Social History Narrative   Married to [place]. 1 adult child [place].   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    Former smoker, quit 2001   Drinks caffeine, takes a daily vitamin.   Wear seatbelt. Smoke detector in the home. Firearms in the home.   safe in his relationships.   Right handed     Allergies as of 07/21/2021   No Known Allergies      Medication List        Accurate as of July 21, 2021  2:50 PM. If you have any questions, ask your nurse or doctor.          STOP taking these medications    AMBULATORY NON FORMULARY MEDICATION Stopped by: Howard Pouch, DO   cyclobenzaprine 10 MG tablet Commonly known as: FLEXERIL Stopped by: Howard Pouch, DO   HYDROcodone-acetaminophen 5-325 MG tablet Commonly known as: NORCO/VICODIN Stopped by: Howard Pouch, DO   senna-docusate 8.6-50 MG tablet Commonly known as: Senokot-S Stopped by: Howard Pouch, DO   Senna-Time 8.6 MG tablet Generic drug: senna Stopped by: Howard Pouch, DO   ZANTAC 360 PO Stopped by: Howard Pouch, DO       TAKE these medications    atorvastatin 10 MG tablet Commonly known as: LIPITOR Take 1 tablet (10 mg total) by mouth daily.   diclofenac  75 MG EC tablet Commonly known as: VOLTAREN Take 1 tablet (75 mg total) by mouth 2 (two) times daily.   lisinopril 30 MG tablet Commonly known as: ZESTRIL Take 1 tablet (30 mg total) by mouth daily.   Mens 50+ Multi Vitamin/Min Tabs Take 1 tablet by mouth daily.   vitamin B-12 1000 MCG tablet Commonly known as: CYANOCOBALAMIN Take 2,500 mcg by mouth daily.       All past medical history, surgical history, allergies, family history, immunizations andmedications were updated in the EMR today and reviewed under the history and medication portions of their EMR.  No results found for this or any previous visit (from the past 2160 hour(s)).  No results found.  ROS 14 pt review of systems performed and negative (unless mentioned in an HPI)  Objective:  BP 121/66    Pulse 100    Temp 98 F (36.7 C)    Ht 5' 4.96" (1.65 m)    Wt 238 lb 3.2 oz (108 kg)    SpO2 97%    BMI 39.69 kg/m   Physical Exam Vitals and nursing note reviewed.  Constitutional:      General: He is not in acute distress.    Appearance: Normal appearance. He is not ill-appearing, toxic-appearing or diaphoretic.  HENT:     Head: Normocephalic and atraumatic.     Right Ear: Tympanic membrane, ear canal and external ear normal. There is no impacted cerumen.     Left Ear: Tympanic membrane, ear canal and external ear normal. There is no impacted cerumen.     Nose: Nose normal. No congestion or rhinorrhea.     Mouth/Throat:     Mouth: Mucous membranes are moist.     Pharynx: Oropharynx is clear. No oropharyngeal exudate or posterior oropharyngeal erythema.  Eyes:     General: No scleral icterus.       Right eye: No discharge.        Left eye: No discharge.     Extraocular Movements: Extraocular movements intact.     Pupils: Pupils are equal, round, and reactive to light.  Cardiovascular:     Rate and Rhythm: Normal rate and regular rhythm.     Pulses: Normal pulses.     Heart sounds: Normal heart sounds.  No murmur heard.   No friction rub. No gallop.  Pulmonary:     Effort: Pulmonary effort is normal. No respiratory distress.     Breath sounds: Normal breath sounds. No stridor. No wheezing, rhonchi or rales.  Chest:     Chest wall: No tenderness.  Abdominal:     General: Abdomen is flat. Bowel sounds are normal. There is no distension.     Palpations: Abdomen is soft. There is no mass.     Tenderness: There is no abdominal tenderness. There is no right CVA tenderness, left CVA tenderness, guarding or rebound.     Hernia: No hernia is present.  Musculoskeletal:        General: No swelling or tenderness. Normal range of motion.     Cervical back: Normal range of motion and neck supple.     Right lower leg: No edema.     Left lower leg: No edema.  Lymphadenopathy:     Cervical: No cervical adenopathy.  Skin:    General: Skin is warm and dry.     Coloration: Skin is not jaundiced.     Findings: No bruising, lesion or rash.  Neurological:     General: No focal deficit present.     Mental Status: He is alert and oriented to person, place, and time. Mental status is at baseline.     Cranial Nerves: No cranial nerve deficit.     Sensory: No sensory deficit.     Motor: No weakness.     Coordination: Coordination normal.     Gait: Gait normal.     Deep Tendon Reflexes: Reflexes normal.  Psychiatric:        Mood and Affect: Mood normal.        Behavior: Behavior normal.        Thought  Content: Thought content normal.        Judgment: Judgment normal.    No results found.  Assessment/plan: Dontell Mian is a 71 y.o. male present for CPE/cmc Hypertension/morbid obesity/Statin declinedFamily history of abdominal aortic aneurysm (AAA)/hypertriglycerides:  stable Continue Lisinopril 30 mg  Low sodium diet.  Routine exercise.  Cbc, cmp, tsh, lipids collected today - AAA screen q 5 yrs due 03/2023 - f/u 5.5 mos.    Arthritis:  Stable Continue Voltaren. Plts have been  stable on medication - f/u 5.5 mos.   Polyp of colon, unspecified part of colon, unspecified type Due 2026 Former smoker AAA screen due 03/2023 for follow up  Bell's palsy Much improved. Mild residual weakness left corner of mouth.   Avascular necrosis of bone of right hip (HCC) Underwent total hip, much improved.   Diabetes mellitus screening - Hemoglobin A1c Prostate cancer screening - PSA  Routine general medical examination at a health care facility Colonoscopy: Completed 2021, by Dr. Fuller Plan. Multiple polyps, 5 year follow-up recommended.  Immunizations:  tdap UTD 2017, influenza UTD  2022,PNA series completed,covid series completed with booster,  shingrix series completed.  Infectious disease screening: HIV and Hep C negative (completed) Patient was encouraged to exercise greater than 150 minutes a week. Patient was encouraged to choose a diet filled with fresh fruits and vegetables, and lean meats. AVS provided to patient today for education/recommendation on gender specific health and safety maintenance.  Return in about 24 weeks (around 01/05/2022) for [facility] (30 min).  Orders Placed This Encounter  Procedures   CBC   Comprehensive metabolic panel   Hemoglobin A1c   Lipid panel   TSH   PSA    Meds ordered this encounter  Medications   atorvastatin (LIPITOR) 10 MG tablet    Sig: Take 1 tablet (10 mg total) by mouth daily.    Dispense:  90 tablet    Refill:  3   diclofenac (VOLTAREN) 75 MG EC tablet    Sig: Take 1 tablet (75 mg total) by mouth 2 (two) times daily.    Dispense:  180 tablet    Refill:  1    Please do not send request for renewal or a year supply. If pt needs refills more than prescribed,it is time for patients follow up with the provider and they should call the office for an appt. Thanks   lisinopril (ZESTRIL) 30 MG tablet    Sig: Take 1 tablet (30 mg total) by mouth daily.    Dispense:  90 tablet    Refill:  1    Hold until pt calls in. Dose change  . DC prior doses.   Referral Orders  No referral(s) requested today    Note is dictated utilizing voice recognition software. Although note has been proof read prior to signing, occasional typographical errors still can be missed. If any questions arise, please do not hesitate to call for verification.  Electronically signed by: Howard Pouch, DO [place]

## 2021-07-21 NOTE — Patient Instructions (Signed)
Great to see you today.  I have refilled the medication(s) we provide.   If labs were collected, we will inform you of lab results once received either by echart message or telephone call.   - echart message- for normal results that have been seen by the patient already.   - telephone call: abnormal results or if patient has not viewed results in their echart. Health Maintenance After Age 69 After age 68, you are at a higher risk for certain long-term diseases and infections as well as injuries from falls. Falls are a major cause of broken bones and head injuries in people who are older than age 61. Getting regular preventive care can help to keep you healthy and well. Preventive care includes getting regular testing and making lifestyle changes as recommended by your health care provider. Talk with your health care provider about: Which screenings and tests you should have. A screening is a test that checks for a disease when you have no symptoms. A diet and exercise plan that is right for you. What should I know about screenings and tests to prevent falls? Screening and testing are the best ways to find a health problem early. Early diagnosis and treatment give you the best chance of managing medical conditions that are common after age 4. Certain conditions and lifestyle choices may make you more likely to have a fall. Your health care provider may recommend: Regular vision checks. Poor vision and conditions such as cataracts can make you more likely to have a fall. If you wear glasses, make sure to get your prescription updated if your vision changes. Medicine review. Work with your health care provider to regularly review all of the medicines you are taking, including over-the-counter medicines. Ask your health care provider about any side effects that may make you more likely to have a fall. Tell your health care provider if any medicines that you take make you feel dizzy or sleepy. Strength  and balance checks. Your health care provider may recommend certain tests to check your strength and balance while standing, walking, or changing positions. Foot health exam. Foot pain and numbness, as well as not wearing proper footwear, can make you more likely to have a fall. Screenings, including: Osteoporosis screening. Osteoporosis is a condition that causes the bones to get weaker and break more easily. Blood pressure screening. Blood pressure changes and medicines to control blood pressure can make you feel dizzy. Depression screening. You may be more likely to have a fall if you have a fear of falling, feel depressed, or feel unable to do activities that you used to do. Alcohol use screening. Using too much alcohol can affect your balance and may make you more likely to have a fall. Follow these instructions at home: Lifestyle Do not drink alcohol if: Your health care provider tells you not to drink. If you drink alcohol: Limit how much you have to: 0-1 drink a day for women. 0-2 drinks a day for men. Know how much alcohol is in your drink. In the [place] one drink equals one 12 oz bottle of beer (355 mL), one 5 oz glass of wine (148 mL), or one 1 oz glass of hard liquor (44 mL). Do not use any products that contain nicotine or tobacco. These products include cigarettes, chewing tobacco, and vaping devices, such as e-cigarettes. If you need help quitting, ask your health care provider. Activity  Follow a regular exercise program to stay fit. This will help you maintain  your balance. Ask your health care provider what types of exercise are appropriate for you. If you need a cane or walker, use it as recommended by your health care provider. Wear supportive shoes that have nonskid soles. Safety  Remove any tripping hazards, such as rugs, cords, and clutter. Install safety equipment such as grab bars in bathrooms and safety rails on stairs. Keep rooms and walkways well-lit. General  instructions Talk with your health care provider about your risks for falling. Tell your health care provider if: You fall. Be sure to tell your health care provider about all falls, even ones that seem minor. You feel dizzy, tiredness (fatigue), or off-balance. Take over-the-counter and prescription medicines only as told by your health care provider. These include supplements. Eat a healthy diet and maintain a healthy weight. A healthy diet includes low-fat dairy products, low-fat (lean) meats, and fiber from whole grains, beans, and lots of fruits and vegetables. Stay current with your vaccines. Schedule regular health, dental, and eye exams. Summary Having a healthy lifestyle and getting preventive care can help to protect your health and wellness after age 25. Screening and testing are the best way to find a health problem early and help you avoid having a fall. Early diagnosis and treatment give you the best chance for managing medical conditions that are more common for people who are older than age 40. Falls are a major cause of broken bones and head injuries in people who are older than age 29. Take precautions to prevent a fall at home. Work with your health care provider to learn what changes you can make to improve your health and wellness and to prevent falls. This information is not intended to replace advice given to you by your health care provider. Make sure you discuss any questions you have with your health care provider. Document Revised: 11/11/2020 Document Reviewed: 11/11/2020 Elsevier Patient Education  [place].

## 2021-07-22 LAB — COMPREHENSIVE METABOLIC PANEL
AG Ratio: 1.8 (calc) (ref 1.0–2.5)
ALT: 13 U/L (ref 9–46)
AST: 15 U/L (ref 10–35)
Albumin: 4.3 g/dL (ref 3.6–5.1)
Alkaline phosphatase (APISO): 58 U/L (ref 35–144)
BUN: 20 mg/dL (ref 7–25)
CO2: 20 mmol/L (ref 20–32)
Calcium: 10.4 mg/dL — ABNORMAL HIGH (ref 8.6–10.3)
Chloride: 110 mmol/L (ref 98–110)
Creat: 1.12 mg/dL (ref 0.70–1.35)
Globulin: 2.4 g/dL (calc) (ref 1.9–3.7)
Glucose, Bld: 83 mg/dL (ref 65–99)
Potassium: 4.9 mmol/L (ref 3.5–5.3)
Sodium: 140 mmol/L (ref 135–146)
Total Bilirubin: 0.6 mg/dL (ref 0.2–1.2)
Total Protein: 6.7 g/dL (ref 6.1–8.1)

## 2021-07-22 LAB — CBC
HCT: 48.1 % (ref 38.5–50.0)
Hemoglobin: 16.3 g/dL (ref 13.2–17.1)
MCH: 29.3 pg (ref 27.0–33.0)
MCHC: 33.9 g/dL (ref 32.0–36.0)
MCV: 86.4 fL (ref 80.0–100.0)
MPV: 10.7 fL (ref 7.5–12.5)
Platelets: 82 10*3/uL — ABNORMAL LOW (ref 140–400)
RBC: 5.57 10*6/uL (ref 4.20–5.80)
RDW: 14.2 % (ref 11.0–15.0)
WBC: 4.6 10*3/uL (ref 3.8–10.8)

## 2021-07-22 LAB — LIPID PANEL
Cholesterol: 189 mg/dL (ref <200)
HDL: 46 mg/dL (ref >=40)
LDL Cholesterol (Calc): 119 mg/dL (calc) — ABNORMAL HIGH
Non-HDL Cholesterol (Calc): 143 mg/dL (calc) — ABNORMAL HIGH (ref <130)
Total CHOL/HDL Ratio: 4.1 (calc) (ref <5.0)
Triglycerides: 126 mg/dL (ref <150)

## 2021-07-22 LAB — HEMOGLOBIN A1C
Hgb A1c MFr Bld: 5.1 % of total Hgb (ref <5.7)
Mean Plasma Glucose: 100 mg/dL
eAG (mmol/L): 5.5 mmol/L

## 2021-07-22 LAB — EXTRA BLUE TOP TUBE

## 2021-07-22 LAB — PSA: PSA: 1.38 ng/mL (ref <=4.00)

## 2021-07-22 LAB — TSH: TSH: 0.8 mIU/L (ref 0.40–4.50)

## 2021-10-06 DIAGNOSIS — Z96641 Presence of right artificial hip joint: Secondary | ICD-10-CM | POA: Diagnosis not present

## 2021-11-13 ENCOUNTER — Telehealth: Payer: Self-pay | Admitting: Family Medicine

## 2021-11-13 NOTE — Telephone Encounter (Signed)
Left message for patient to schedule Annual Wellness Visit.  Please schedule (telephone/video call) with Nurse Health Advisor Tina Betterson, RN at [facility]. Please call [phone] ask for Kathy 

## 2021-12-30 ENCOUNTER — Ambulatory Visit (INDEPENDENT_AMBULATORY_CARE_PROVIDER_SITE_OTHER): Payer: BC Managed Care – PPO | Admitting: Family Medicine

## 2021-12-30 ENCOUNTER — Encounter: Payer: Self-pay | Admitting: Family Medicine

## 2021-12-30 VITALS — BP 150/63 | HR 82 | Temp 98.0°F | Ht 65.0 in | Wt 240.0 lb

## 2021-12-30 DIAGNOSIS — Z5181 Encounter for therapeutic drug level monitoring: Secondary | ICD-10-CM

## 2021-12-30 DIAGNOSIS — R7309 Other abnormal glucose: Secondary | ICD-10-CM

## 2021-12-30 DIAGNOSIS — I10 Essential (primary) hypertension: Secondary | ICD-10-CM

## 2021-12-30 DIAGNOSIS — Z79899 Other long term (current) drug therapy: Secondary | ICD-10-CM | POA: Diagnosis not present

## 2021-12-30 MED ORDER — LISINOPRIL 40 MG PO TABS: 40.0000 mg | ORAL_TABLET | Freq: Every day | ORAL | 1 refills | Status: DC

## 2021-12-30 MED ORDER — DICLOFENAC SODIUM 75 MG PO TBEC: 75.0000 mg | DELAYED_RELEASE_TABLET | Freq: Two times a day (BID) | ORAL | 1 refills | Status: DC

## 2021-12-30 NOTE — Progress Notes (Signed)
Patient ID: Paul Phillips, male  DOB: [birth year], 71 y.o.   MRN: [id number] Patient Care Team    Relationship Specialty Notifications Start End  Natalia Leatherwood, DO PCP - General Family Medicine  06/19/16   Reginia Naas, MD Referring Physician Dermatology  12/21/18   Marcos Eke, PA-C  Neurology  01/08/21   Drema Dallas, DO Consulting Physician Neurology  01/08/21     Chief Complaint  Patient presents with   Hypertension    Cmc; pt is not fasting    Subjective: Paul Phillips is a 71 y.o. male present for [facility]. All past medical history, surgical history, allergies, family history, immunizations, medications and social history were updated in the electronic medical record today. All recent labs, ED visits and hospitalizations within the last year were reviewed.  HTN/HLD/Morbid obesity/thrombocytopenia: Pt reports compliance  with lisinopril 30 mg QD. Patient denies chest pain, shortness of breath, dizziness or lower extremity edema.  Pt takes a daily baby ASA. Pt is  prescribed statin Diet: Started watching his diet very closely. Exercise: Is exercising routinely.   RF: Hypertension, hypertriglyceridemia, former smoker, strong family history of heart disease, stroke and aneurysms   Arthritis:  Pt reports voltaren works well for him.        07/21/2021    2:33 PM 07/15/2020    2:32 PM 06/26/2019    2:56 PM 12/21/2018    2:49 PM 12/10/2017    2:57 PM  Depression screen PHQ 2/9  Decreased Interest 0 0 0 0 0  Down, Depressed, Hopeless 0 0 0 0   PHQ - 2 Score 0 0 0 0 0  Altered sleeping 2      Tired, decreased energy 0      Change in appetite 0      Feeling bad or failure about yourself  0      Trouble concentrating 0      Moving slowly or fidgety/restless 0      Suicidal thoughts 0      PHQ-9 Score 2          07/21/2021    2:34 PM  GAD 7 : Generalized Anxiety Score  Nervous, Anxious, on Edge 0  Control/stop worrying 0  Worry too much - different  things 0  Trouble relaxing 0  Restless 0  Easily annoyed or irritable 0  Afraid - awful might happen 0  Total GAD 7 Score 0            12/30/2021    2:51 PM 01/08/2021    2:55 PM 07/15/2020    2:32 PM 04/02/2020    2:32 PM 12/21/2018    2:49 PM  Fall Risk   Falls in the past year? 0 0 0 0 0  Number falls in past yr: 0 0 0 0   Injury with Fall? 0 0 0 0   Risk for fall due to : No Fall Risks      Follow up Falls evaluation completed   Falls evaluation completed     Immunization History  Administered Date(s) Administered   Hepatitis B, ped/adol 09/22/2013   Influenza-Unspecified 04/05/2017, 04/26/2018, 03/07/2019, 04/05/2020, 05/06/2021   PFIZER(Purple Top)SARS-COV-2 Vaccination 09/03/2019, 10/03/2019, 06/24/2020   Pneumococcal Conjugate-13 07/03/2016   Pneumococcal Polysaccharide-23 06/22/2018   Tdap 07/03/2016   Zoster Recombinat (Shingrix) 07/15/2020, 11/08/2020     Past Medical History:  Diagnosis Date   Dislocated thumb    GERD (gastroesophageal reflux disease)  Heart murmur    Hypertension    Osteoarthritis of right hip    Osteoarthritis, multiple sites    hands and hips   Osteonecrosis of right hip (HCC)    pt to get hip replacement by [facility] as of 03/2021   Sleep disturbance 06/22/2018   No Known Allergies Past Surgical History:  Procedure Laterality Date   COLONOSCOPY  10/26/2016   TA   right total hip arthroplasty Right 04/11/2021   Family History  Problem Relation Age of Onset   Arthritis Mother    Diabetes Mother    Hearing loss Mother    Stroke Mother    Hearing loss Father    Heart disease Father    AAA (abdominal aortic aneurysm) Father    Aneurysm Sister 56       brain, died from aneurysm suddenly.    Heart attack Brother    Arthritis/Rheumatoid Maternal Grandmother    Brain cancer Maternal Grandfather    Colon cancer Neg Hx    Colon polyps Neg Hx    Esophageal cancer Neg Hx    Stomach cancer Neg Hx    Rectal cancer Neg Hx     Social History   Social History Narrative   Married to [place]. 1 adult child [place].   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    Former smoker, quit 2001   Drinks caffeine, takes a daily vitamin.   Wear seatbelt. Smoke detector in the home. Firearms in the home.   safe in his relationships.   Right handed     Allergies as of 12/30/2021   No Known Allergies      Medication List        Accurate as of December 30, 2021  3:06 PM. If you have any questions, ask your nurse or doctor.          atorvastatin 10 MG tablet Commonly known as: LIPITOR Take 1 tablet (10 mg total) by mouth daily.   diclofenac 75 MG EC tablet Commonly known as: VOLTAREN Take 1 tablet (75 mg total) by mouth 2 (two) times daily.   lisinopril 40 MG tablet Commonly known as: ZESTRIL Take 1 tablet (40 mg total) by mouth daily. What changed:  medication strength how much to take Changed by: Felix Pacini, DO   Mens 50+ Multi Vitamin/Min Tabs Take 1 tablet by mouth daily.   vitamin B-12 1000 MCG tablet Commonly known as: CYANOCOBALAMIN Take 2,500 mcg by mouth daily.       All past medical history, surgical history, allergies, family history, immunizations andmedications were updated in the EMR today and reviewed under the history and medication portions of their EMR.     No results found for this or any previous visit (from the past 2160 hour(s)).  No results found.  ROS 14 pt review of systems performed and negative (unless mentioned in an HPI)  Objective: BP (!) 150/63   Pulse 82   Temp 98 F (36.7 C) (Oral)   Ht 5\' 5"  (1.651 m)   Wt 240 lb (108.9 kg)   SpO2 96%   BMI 39.94 kg/m  Physical Exam Vitals and nursing note reviewed.  Constitutional:      General: He is not in acute distress.    Appearance: Normal appearance. He is not ill-appearing, toxic-appearing or diaphoretic.  HENT:     Head: Normocephalic and atraumatic.  Eyes:     General: No scleral icterus.        Right eye: No discharge.  Left eye: No discharge.     Extraocular Movements: Extraocular movements intact.     Pupils: Pupils are equal, round, and reactive to light.  Cardiovascular:     Rate and Rhythm: Normal rate and regular rhythm.  Pulmonary:     Effort: Pulmonary effort is normal. No respiratory distress.     Breath sounds: Normal breath sounds. No wheezing, rhonchi or rales.  Musculoskeletal:     Right lower leg: No edema.     Left lower leg: No edema.  Skin:    General: Skin is warm and dry.     Coloration: Skin is not jaundiced or pale.     Findings: No rash.  Neurological:     Mental Status: He is alert and oriented to person, place, and time. Mental status is at baseline.  Psychiatric:        Mood and Affect: Mood normal.        Behavior: Behavior normal.        Thought Content: Thought content normal.        Judgment: Judgment normal.     No results found.  Assessment/plan: Paul Phillips is a 71 y.o. male present for CPE/cmc Hypertension/morbid obesity/Statin declinedFamily history of abdominal aortic aneurysm (AAA)/hypertriglycerides:  Stable. Crease lisinopril to 40 mg.  Strongly encourage exercise and dietary modifications. Low sodium diet.  CMP and LDL collected today. - AAA screen q 5 yrs due 03/2023 - f/u 5.5 mos.  (cpe)   Arthritis:  Stable Continue  Voltaren.  Plts have been stable on medication. (His plts clump frequently)  Bell's palsy Much improved. Mild residual weakness left corner of mouth.   Avascular necrosis of bone of right hip (HCC) Underwent total hip, much improved.    No follow-ups on file.  Orders Placed This Encounter  Procedures   Direct LDL   Comp Met (CMET)    Meds ordered this encounter  Medications   diclofenac (VOLTAREN) 75 MG EC tablet    Sig: Take 1 tablet (75 mg total) by mouth 2 (two) times daily.    Dispense:  180 tablet    Refill:  1    Please do not send request for renewal or a year  supply. If pt needs refills more than prescribed,it is time for patients follow up with the provider and they should call the office for an appt. Thanks   lisinopril (ZESTRIL) 40 MG tablet    Sig: Take 1 tablet (40 mg total) by mouth daily.    Dispense:  90 tablet    Refill:  1    Hold until pt calls in. Dose change . DC prior doses.   Referral Orders  No referral(s) requested today    Note is dictated utilizing voice recognition software. Although note has been proof read prior to signing, occasional typographical errors still can be missed. If any questions arise, please do not hesitate to call for verification.  Electronically signed by: Felix Pacini, DO [facility] [place]

## 2021-12-31 LAB — COMPREHENSIVE METABOLIC PANEL
AG Ratio: 1.8 (calc) (ref 1.0–2.5)
ALT: 14 U/L (ref 9–46)
AST: 15 U/L (ref 10–35)
Albumin: 4.1 g/dL (ref 3.6–5.1)
Alkaline phosphatase (APISO): 56 U/L (ref 35–144)
BUN: 20 mg/dL (ref 7–25)
CO2: 21 mmol/L (ref 20–32)
Calcium: 9.8 mg/dL (ref 8.6–10.3)
Chloride: 109 mmol/L (ref 98–110)
Creat: 1.17 mg/dL (ref 0.70–1.35)
Globulin: 2.3 g/dL (calc) (ref 1.9–3.7)
Glucose, Bld: 81 mg/dL (ref 65–99)
Potassium: 4.4 mmol/L (ref 3.5–5.3)
Sodium: 141 mmol/L (ref 135–146)
Total Bilirubin: 0.8 mg/dL (ref 0.2–1.2)
Total Protein: 6.4 g/dL (ref 6.1–8.1)

## 2021-12-31 LAB — HEMOGLOBIN A1C
Hgb A1c MFr Bld: 5 % of total Hgb (ref <5.7)
Mean Plasma Glucose: 97 mg/dL
eAG (mmol/L): 5.4 mmol/L

## 2021-12-31 LAB — LDL CHOLESTEROL, DIRECT: Direct LDL: 73 mg/dL (ref <100)

## 2022-01-01 ENCOUNTER — Telehealth: Payer: Self-pay | Admitting: Family Medicine

## 2022-01-01 NOTE — Telephone Encounter (Signed)
Pt informed to start 40 mg of lisinopril

## 2022-01-01 NOTE — Telephone Encounter (Signed)
Please advise 

## 2022-01-01 NOTE — Telephone Encounter (Signed)
Caller Name: Ray Call back phone #: [phone]  MEDICATION(S): lisinopril   Pt requesting call back to notify if he is to continue taking '30mg'$  a day until what he has is gone or if he should go ahead and fill '40mg'$ . He said he has over a month of '30mg'$  tablets left.

## 2022-01-01 NOTE — Telephone Encounter (Signed)
I would encourage him to start lisinopril 40 mg tab now

## 2022-02-25 ENCOUNTER — Ambulatory Visit (INDEPENDENT_AMBULATORY_CARE_PROVIDER_SITE_OTHER): Payer: BC Managed Care – PPO

## 2022-02-25 DIAGNOSIS — Z0000 Encounter for general adult medical examination without abnormal findings: Secondary | ICD-10-CM

## 2022-02-25 NOTE — Progress Notes (Signed)
Pt did not answer.

## 2022-02-25 NOTE — Progress Notes (Deleted)
Subjective:   Paul Phillips is a 71 y.o. male who presents for an Initial Medicare Annual Wellness Visit.  Review of Systems    Defer to PCP       Objective:    There were no vitals filed for this visit. There is no height or weight on file to calculate BMI.     02/23/2021    1:58 PM 01/15/2021    2:32 PM 01/08/2021    2:55 PM 07/25/2020    1:51 PM 09/17/2017   12:13 PM 10/12/2016   10:50 AM  Advanced Directives  Does Patient Have a Medical Advance Directive? No No No No No No  Would patient like information on creating a medical advance directive?  No - Patient declined   No - Patient declined     Current Medications (verified) Outpatient Encounter Medications as of 02/25/2022  Medication Sig   atorvastatin (LIPITOR) 10 MG tablet Take 1 tablet (10 mg total) by mouth daily.   diclofenac (VOLTAREN) 75 MG EC tablet Take 1 tablet (75 mg total) by mouth 2 (two) times daily.   lisinopril (ZESTRIL) 40 MG tablet Take 1 tablet (40 mg total) by mouth daily.   Multiple Vitamins-Minerals (MENS 50+ MULTI VITAMIN/MIN) TABS Take 1 tablet by mouth daily.   vitamin B-12 (CYANOCOBALAMIN) 1000 MCG tablet Take 2,500 mcg by mouth daily.    No facility-administered encounter medications on file as of 02/25/2022.    Allergies (verified) Patient has no known allergies.   History: Past Medical History:  Diagnosis Date   Dislocated thumb    GERD (gastroesophageal reflux disease)    Heart murmur    Hypertension    Osteoarthritis of right hip    Osteoarthritis, multiple sites    hands and hips   Osteonecrosis of right hip ([place])    pt to get hip replacement by [facility] as of 03/2021   Sleep disturbance 06/22/2018   Past Surgical History:  Procedure Laterality Date   COLONOSCOPY  10/26/2016   TA   right total hip arthroplasty Right 04/11/2021   Family History  Problem Relation Age of Onset   Arthritis Mother    Diabetes Mother    Hearing loss Mother    Stroke Mother     Hearing loss Father    Heart disease Father    AAA (abdominal aortic aneurysm) Father    Aneurysm Sister 71       brain, died from aneurysm suddenly.    Heart attack Brother    Arthritis/Rheumatoid Maternal Grandmother    Brain cancer Maternal Grandfather    Colon cancer Neg Hx    Colon polyps Neg Hx    Esophageal cancer Neg Hx    Stomach cancer Neg Hx    Rectal cancer Neg Hx    Social History   Socioeconomic History   Marital status: Married    Spouse name: Kitty   Number of children: 1   Years of education: 14   Highest education level: Not on file  Occupational History   Occupation: Clinical biochemist  Tobacco Use   Smoking status: Former    Packs/day: 1.00    Years: 30.00    Total pack years: 30.00    Types: Cigarettes    Quit date: 07/07/1999    Years since quitting: 22.6   Smokeless tobacco: Never  Vaping Use   Vaping Use: Never used  Substance and Sexual Activity   Alcohol use: Yes    Alcohol/week: 4.0 standard drinks of alcohol  Types: 4 Cans of beer per week   Drug use: No   Sexual activity: Yes    Partners: Female    Comment: married  Other Topics Concern   Not on file  Social History Narrative   Married to [place]. 1 adult child [place].   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    Former smoker, quit 2001   Drinks caffeine, takes a daily vitamin.   Wear seatbelt. Smoke detector in the home. Firearms in the home.   safe in his relationships.   Right handed    Social Determinants of Health   Financial Resource Strain: Not on file  Food Insecurity: Not on file  Transportation Needs: Not on file  Physical Activity: Not on file  Stress: Not on file  Social Connections: Not on file    Tobacco Counseling Counseling given: Not Answered   Clinical Intake:                 Diabetic?***         Activities of Daily Living     No data to display           Patient Care Team: Ma Hillock, DO as PCP - General (Family  Medicine) Orbie Hurst, MD as Referring Physician (Dermatology) Elease Hashimoto (Neurology) Pieter Partridge, DO as Consulting Physician (Neurology)  Indicate any recent Medical Services you may have received from other than [facility] providers in the past year (date may be approximate).     Assessment:   This is a routine wellness examination for [place].  Hearing/Vision screen No results found.  Dietary issues and exercise activities discussed:     Goals Addressed   None   Depression Screen    07/21/2021    2:33 PM 07/15/2020    2:32 PM 06/26/2019    2:56 PM 12/21/2018    2:49 PM 12/10/2017    2:57 PM 07/07/2017    3:09 PM 07/03/2016   11:06 AM  PHQ 2/9 Scores  PHQ - 2 Score 0 0 0 0 0 0 0  PHQ- 9 Score 2          Fall Risk    12/30/2021    2:51 PM 01/08/2021    2:55 PM 07/15/2020    2:32 PM 04/02/2020    2:32 PM 12/21/2018    2:49 PM  [place] in the past year? 0 0 0 0 0  Number falls in past yr: 0 0 0 0   Injury with Fall? 0 0 0 0   Risk for fall due to : No Fall Risks      Follow up Falls evaluation completed   Falls evaluation completed     FALL RISK PREVENTION PERTAINING TO THE HOME:  Any stairs in or around the home? {YES/NO:21197} If so, are there any without handrails? {YES/NO:21197} Home free of loose throw rugs in walkways, pet beds, electrical cords, etc? {YES/NO:21197} Adequate lighting in your home to reduce risk of falls? {YES/NO:21197}  ASSISTIVE DEVICES UTILIZED TO PREVENT FALLS:  Life alert? {YES/NO:21197} Use of a cane, walker or w/c? {YES/NO:21197} Grab bars in the bathroom? {YES/NO:21197} Shower chair or bench in shower? {YES/NO:21197} Elevated toilet seat or a handicapped toilet? {YES/NO:21197}  TIMED UP AND GO:  Was the test performed? {YES/NO:21197}.  Length of time to ambulate 10 feet: *** sec.   {Appearance of MCNO:7096283}  Cognitive Function:        Immunizations Immunization History  Administered  Date(s)  Administered   Hepatitis B, ped/adol 09/22/2013   Influenza-Unspecified 04/05/2017, 04/26/2018, 03/07/2019, 04/05/2020, 05/06/2021   PFIZER(Purple Top)SARS-COV-2 Vaccination 09/03/2019, 10/03/2019, 06/24/2020   Pneumococcal Conjugate-13 07/03/2016   Pneumococcal Polysaccharide-23 06/22/2018   Tdap 07/03/2016   Zoster Recombinat (Shingrix) 07/15/2020, 11/08/2020    {TDAP status:2101805}  {Flu Vaccine status:2101806}  {Pneumococcal vaccine status:2101807}  {Covid-19 vaccine status:2101808}  Qualifies for Shingles Vaccine? {YES/NO:21197}  Zostavax completed {YES/NO:21197}  {Shingrix Completed?:2101804}  Screening Tests Health Maintenance  Topic Date Due   INFLUENZA VACCINE  02/03/2022   COVID-19 Vaccine (4 - Pfizer risk series) 08/07/2023 (Originally 08/19/2020)   COLONOSCOPY (Pts 45-70yr Insurance coverage will need to be confirmed)  10/22/2024   TETANUS/TDAP  07/03/2026   Pneumonia Vaccine 71 Years old  Completed   Hepatitis C Screening  Completed   Zoster Vaccines- Shingrix  Completed   HPV VACCINES  Aged Out    Health Maintenance  Health Maintenance Due  Topic Date Due   INFLUENZA VACCINE  02/03/2022    {Colorectal cancer screening:2101809}  Lung Cancer Screening: (Low Dose CT Chest recommended if Age 371-80years, 30 pack-year currently smoking OR have quit w/in 15years.) {DOES NOT does:27190::"does not"} qualify.   Lung Cancer Screening Referral: ***  Additional Screening:  Hepatitis C Screening: {DOES NOT does:27190::"does not"} qualify; Completed ***  Vision Screening: Recommended annual ophthalmology exams for early detection of glaucoma and other disorders of the eye. Is the patient up to date with their annual eye exam?  {YES/NO:21197} Who is the provider or what is the name of the office in which the patient attends annual eye exams? *** If pt is not established with a provider, would they like to be referred to a provider to establish care?  {YES/NO:21197}.   Dental Screening: Recommended annual dental exams for proper oral hygiene  Community Resource Referral / Chronic Care Management: CRR required this visit?  {YES/NO:21197}  CCM required this visit?  {YES/NO:21197}     Plan:     I have personally reviewed and noted the following in the patient's chart:   Medical and social history Use of alcohol, tobacco or illicit drugs  Current medications and supplements including opioid prescriptions. {Opioid [phone]} Functional ability and status Nutritional status Physical activity Advanced directives List of other physicians Hospitalizations, surgeries, and ER visits in previous 12 months Vitals Screenings to include cognitive, depression, and falls Referrals and appointments  In addition, I have reviewed and discussed with patient certain preventive protocols, quality metrics, and best practice recommendations. A written personalized care plan for preventive services as well as general preventive health recommendations were provided to patient.     VOctaviano Glow CMA   02/25/2022   Nurse Notes: ***

## 2022-03-11 ENCOUNTER — Ambulatory Visit (INDEPENDENT_AMBULATORY_CARE_PROVIDER_SITE_OTHER): Payer: BC Managed Care – PPO

## 2022-03-11 DIAGNOSIS — Z0000 Encounter for general adult medical examination without abnormal findings: Secondary | ICD-10-CM

## 2022-03-11 NOTE — Progress Notes (Signed)
Subjective:   Paul Phillips is a 71 y.o. male who presents for an Initial Medicare Annual Wellness Visit.  I connected with  Paul Phillips on 03/11/22 by an audio only telemedicine application and verified that I am speaking with the correct person using two identifiers.   I discussed the limitations, risks, security and privacy concerns of performing an evaluation and management service by telephone and the availability of in person appointments. I also discussed with the patient that there may be a patient responsible charge related to this service. The patient expressed understanding and verbally consented to this telephonic visit.  Location of Patient: home Location of Provider:office  List any persons and their role that are participating in the visit with the patient.   Post, CMA  Review of Systems    Defer to PCP Cardiac Risk Factors include: advanced age (>62mn, >>44women);male gender     Objective:    There were no vitals filed for this visit. There is no height or weight on file to calculate BMI.     03/11/2022   12:19 PM 02/23/2021    1:58 PM 01/15/2021    2:32 PM 01/08/2021    2:55 PM 07/25/2020    1:51 PM 09/17/2017   12:13 PM 10/12/2016   10:50 AM  Advanced Directives  Does Patient Have a Medical Advance Directive? Yes No No No No No No  Type of Advance Directive Living will        Does patient want to make changes to medical advance directive? No - Patient declined        Would patient like information on creating a medical advance directive?   No - Patient declined   No - Patient declined     Current Medications (verified) Outpatient Encounter Medications as of 03/11/2022  Medication Sig   atorvastatin (LIPITOR) 10 MG tablet Take 1 tablet (10 mg total) by mouth daily.   diclofenac (VOLTAREN) 75 MG EC tablet Take 1 tablet (75 mg total) by mouth 2 (two) times daily.   lisinopril (ZESTRIL) 40 MG tablet Take 1 tablet  (40 mg total) by mouth daily.   Multiple Vitamins-Minerals (MENS 50+ MULTI VITAMIN/MIN) TABS Take 1 tablet by mouth daily.   vitamin B-12 (CYANOCOBALAMIN) 1000 MCG tablet Take 2,500 mcg by mouth daily.    No facility-administered encounter medications on file as of 03/11/2022.    Allergies (verified) Patient has no known allergies.   History: Past Medical History:  Diagnosis Date   Dislocated thumb    GERD (gastroesophageal reflux disease)    Heart murmur    Hypertension    Osteoarthritis of right hip    Osteoarthritis, multiple sites    hands and hips   Osteonecrosis of right hip ([facility]    pt to get hip replacement by [facility] as of 03/2021   Sleep disturbance 06/22/2018   Past Surgical History:  Procedure Laterality Date   COLONOSCOPY  10/26/2016   TA   right total hip arthroplasty Right 04/11/2021   Family History  Problem Relation Age of Onset   Arthritis Mother    Diabetes Mother    Hearing loss Mother    Stroke Mother    Hearing loss Father    Heart disease Father    AAA (abdominal aortic aneurysm) Father    Aneurysm Sister 564      brain, died from aneurysm suddenly.    Heart attack Brother    Arthritis/Rheumatoid Maternal  Grandmother    Brain cancer Maternal Grandfather    Colon cancer Neg Hx    Colon polyps Neg Hx    Esophageal cancer Neg Hx    Stomach cancer Neg Hx    Rectal cancer Neg Hx    Social History   Socioeconomic History   Marital status: Married    Spouse name: Paul Phillips   Number of children: 1   Years of education: 14   Highest education level: Not on file  Occupational History   Occupation: Clinical biochemist  Tobacco Use   Smoking status: Former    Packs/day: 1.00    Years: 30.00    Total pack years: 30.00    Types: Cigarettes    Quit date: 07/07/1999    Years since quitting: 22.6   Smokeless tobacco: Never  Vaping Use   Vaping Use: Never used  Substance and Sexual Activity   Alcohol use: Yes    Alcohol/week: 4.0 standard drinks of  alcohol    Types: 4 Cans of beer per week   Drug use: No   Sexual activity: Yes    Partners: Female    Comment: married  Other Topics Concern   Not on file  Social History Narrative   Married to Paul Phillips. 1 adult child Paul Phillips.   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    Former smoker, quit 2001   Drinks caffeine, takes a daily vitamin.   Wear seatbelt. Smoke detector in the home. Firearms in the home.   safe in his relationships.   Right handed    Social Determinants of Health   Financial Resource Strain: Not on file  Food Insecurity: Not on file  Transportation Needs: Not on file  Physical Activity: Not on file  Stress: Not on file  Social Connections: Not on file    Tobacco Counseling Counseling given: Not Answered   Clinical Intake:              How often do you need to have someone help you when you read instructions, pamphlets, or other written materials from your doctor or pharmacy?: 1 - Never  Diabetic?no   Interpreter Needed?: No      Activities of Daily Living    03/11/2022   12:20 PM 03/11/2022    6:40 AM  In your present state of health, do you have any difficulty performing the following activities:  Hearing? 1 1  Vision? 0 0  Difficulty concentrating or making decisions? 1 1  Walking or climbing stairs? 0 0  Dressing or bathing? 0 0  Doing errands, shopping? 0 0  Preparing Food and eating ? N N  Using the Toilet? N N  In the past six months, have you accidently leaked urine? N N  Do you have problems with loss of bowel control? N N  Managing your Medications? N N  Managing your Finances? N N  Housekeeping or managing your Housekeeping? N N    Patient Care Team: Ma Hillock, DO as PCP - General (Family Medicine) Orbie Hurst, MD as Referring Physician (Dermatology) Elease Hashimoto (Neurology) Pieter Partridge, DO as Consulting Physician (Neurology)  Indicate any recent Medical Services you may have received from other  than [facility] providers in the past year (date may be approximate).     Assessment:   This is a routine wellness examination for Paul Phillips.  Hearing/Vision screen No results found.  Dietary issues and exercise activities discussed: Current Exercise Habits: The patient has a physically strenuous job,  but has no regular exercise apart from work., Intensity: Mild   Goals Addressed   None   Depression Screen    07/21/2021    2:33 PM 07/15/2020    2:32 PM 06/26/2019    2:56 PM 12/21/2018    2:49 PM 12/10/2017    2:57 PM 07/07/2017    3:09 PM 07/03/2016   11:06 AM  PHQ 2/9 Scores  PHQ - 2 Score 0 0 0 0 0 0 0  PHQ- 9 Score 2          Fall Risk    03/11/2022    6:41 AM 03/11/2022    6:40 AM 12/30/2021    2:51 PM 01/08/2021    2:55 PM 07/15/2020    2:32 PM  Fall Risk   Falls in the past year? 0 0 0 0 0  Number falls in past yr:  0 0 0 0  Injury with Fall?  0 0 0 0  Risk for fall due to :   No Fall Risks    Follow up   Falls evaluation completed      FALL RISK PREVENTION PERTAINING TO THE HOME:  Any stairs in or around the home? No  If so, are there any without handrails? No  Home free of loose throw rugs in walkways, pet beds, electrical cords, etc? Yes  Adequate lighting in your home to reduce risk of falls? Yes   ASSISTIVE DEVICES UTILIZED TO PREVENT FALLS:  Life alert? No  Use of a cane, walker or w/c? No  Grab bars in the bathroom? Yes  Shower chair or bench in shower? Yes  Elevated toilet seat or a handicapped toilet? No   TIMED UP AND GO:  Was the test performed? No .  Length of time to ambulate 10 feet: n/a sec.     Cognitive Function:        Immunizations Immunization History  Administered Date(s) Administered   Hepatitis B, ped/adol 09/22/2013   Influenza-Unspecified 04/05/2017, 04/26/2018, 03/07/2019, 04/05/2020, 05/06/2021   PFIZER(Purple Top)SARS-COV-2 Vaccination 09/03/2019, 10/03/2019, 06/24/2020   Pneumococcal Conjugate-13 07/03/2016   Pneumococcal  Polysaccharide-23 06/22/2018   Tdap 07/03/2016   Zoster Recombinat (Shingrix) 07/15/2020, 11/08/2020    TDAP status: Up to date  Flu Vaccine status: Due, Education has been provided regarding the importance of this vaccine. Advised may receive this vaccine at local pharmacy or Health Dept. Aware to provide a copy of the vaccination record if obtained from local pharmacy or Health Dept. Verbalized acceptance and understanding.  Pneumococcal vaccine status: Up to date  Covid-19 vaccine status: Completed vaccines  Qualifies for Shingles Vaccine? Yes   Zostavax completed No   Shingrix Completed?: Yes  Screening Tests Health Maintenance  Topic Date Due   INFLUENZA VACCINE  02/03/2022   COVID-19 Vaccine (4 - Pfizer risk series) 08/07/2023 (Originally 08/19/2020)   COLONOSCOPY (Pts 45-73yr Insurance coverage will need to be confirmed)  10/22/2024   TETANUS/TDAP  07/03/2026   Pneumonia Vaccine 71 Years old  Completed   Hepatitis C Screening  Completed   Zoster Vaccines- Shingrix  Completed   HPV VACCINES  Aged Out    Health Maintenance  Health Maintenance Due  Topic Date Due   INFLUENZA VACCINE  02/03/2022    Colorectal cancer screening: Type of screening: Colonoscopy. Completed 10/23/2019. Repeat every 5 years  Lung Cancer Screening: (Low Dose CT Chest recommended if Age 69-80years, 30 pack-year currently smoking OR have quit w/in 15years.) does qualify.   Lung Cancer Screening  Referral: n/a  Additional Screening:  Hepatitis C Screening: does qualify; Completed 07/01/2016  Vision Screening: Recommended annual ophthalmology exams for early detection of glaucoma and other disorders of the eye. Is the patient up to date with their annual eye exam?  Yes  Who is the provider or what is the name of the office in which the patient attends annual eye exams? [facility] If pt is not established with a provider, would they like to be referred to a provider to establish care? No .    Dental Screening: Recommended annual dental exams for proper oral hygiene  Community Resource Referral / Chronic Care Management: CRR required this visit?  No   CCM required this visit?  No      Plan:     I have personally reviewed and noted the following in the patient's chart:   Medical and social history Use of alcohol, tobacco or illicit drugs  Current medications and supplements including opioid prescriptions. Patient is not currently taking opioid prescriptions. Functional ability and status Nutritional status Physical activity Advanced directives List of other physicians Hospitalizations, surgeries, and ER visits in previous 12 months Vitals Screenings to include cognitive, depression, and falls Referrals and appointments  In addition, I have reviewed and discussed with patient certain preventive protocols, quality metrics, and best practice recommendations. A written personalized care plan for preventive services as well as general preventive health recommendations were provided to patient.     Octaviano Glow, CMA   03/11/2022   Nurse Notes: Non-Face to Face or Face to Face 8 minute visit Encounter   Mr. Ernesta Amble , Thank you for taking time to come for your Medicare Wellness Visit. I appreciate your ongoing commitment to your health goals. Please review the following plan we discussed and let me know if I can assist you in the future.   These are the goals we discussed:  Goals   None     This is a list of the screening recommended for you and due dates:  Health Maintenance  Topic Date Due   Flu Shot  02/03/2022   COVID-19 Vaccine (4 - Pfizer risk series) 08/07/2023*   Colon Cancer Screening  10/22/2024   Tetanus Vaccine  07/03/2026   Pneumonia Vaccine  Completed   Hepatitis C Screening: USPSTF Recommendation to screen - Ages 18-79 yo.  Completed   Zoster (Shingles) Vaccine  Completed   HPV Vaccine  Aged Out  *Topic was postponed. The date shown  is not the original due date.

## 2022-03-11 NOTE — Patient Instructions (Signed)
Health Maintenance, Male Adopting a healthy lifestyle and getting preventive care are important in promoting health and wellness. Ask your health care provider about: The right schedule for you to have regular tests and exams. Things you can do on your own to prevent diseases and keep yourself healthy. What should I know about diet, weight, and exercise? Eat a healthy diet  Eat a diet that includes plenty of vegetables, fruits, low-fat dairy products, and lean protein. Do not eat a lot of foods that are high in solid fats, added sugars, or sodium. Maintain a healthy weight Body mass index (BMI) is a measurement that can be used to identify possible weight problems. It estimates body fat based on height and weight. Your health care provider can help determine your BMI and help you achieve or maintain a healthy weight. Get regular exercise Get regular exercise. This is one of the most important things you can do for your health. Most adults should: Exercise for at least 150 minutes each week. The exercise should increase your heart rate and make you sweat (moderate-intensity exercise). Do strengthening exercises at least twice a week. This is in addition to the moderate-intensity exercise. Spend less time sitting. Even light physical activity can be beneficial. Watch cholesterol and blood lipids Have your blood tested for lipids and cholesterol at 69 years of age, then have this test every 5 years. You may need to have your cholesterol levels checked more often if: Your lipid or cholesterol levels are high. You are older than 69 years of age. You are at high risk for heart disease. What should I know about cancer screening? Many types of cancers can be detected early and may often be prevented. Depending on your health history and family history, you may need to have cancer screening at various ages. This may include screening for: Colorectal cancer. Prostate cancer. Skin cancer. Lung  cancer. What should I know about heart disease, diabetes, and high blood pressure? Blood pressure and heart disease High blood pressure causes heart disease and increases the risk of stroke. This is more likely to develop in people who have high blood pressure readings or are overweight. Talk with your health care provider about your target blood pressure readings. Have your blood pressure checked: Every 3-5 years if you are 18-39 years of age. Every year if you are 40 years old or older. If you are between the ages of 65 and 75 and are a current or former smoker, ask your health care provider if you should have a one-time screening for abdominal aortic aneurysm (AAA). Diabetes Have regular diabetes screenings. This checks your fasting blood sugar level. Have the screening done: Once every three years after age 45 if you are at a normal weight and have a low risk for diabetes. More often and at a younger age if you are overweight or have a high risk for diabetes. What should I know about preventing infection? Hepatitis B If you have a higher risk for hepatitis B, you should be screened for this virus. Talk with your health care provider to find out if you are at risk for hepatitis B infection. Hepatitis C Blood testing is recommended for: Everyone born from 1945 through 1965. Anyone with known risk factors for hepatitis C. Sexually transmitted infections (STIs) You should be screened each year for STIs, including gonorrhea and chlamydia, if: You are sexually active and are younger than 69 years of age. You are older than 69 years of age and your   health care provider tells you that you are at risk for this type of infection. Your sexual activity has changed since you were last screened, and you are at increased risk for chlamydia or gonorrhea. Ask your health care provider if you are at risk. Ask your health care provider about whether you are at high risk for HIV. Your health care provider  may recommend a prescription medicine to help prevent HIV infection. If you choose to take medicine to prevent HIV, you should first get tested for HIV. You should then be tested every 3 months for as long as you are taking the medicine. Follow these instructions at home: Alcohol use Do not drink alcohol if your health care provider tells you not to drink. If you drink alcohol: Limit how much you have to 0-2 drinks a day. Know how much alcohol is in your drink. In the [place] one drink equals one 12 oz bottle of beer (355 mL), one 5 oz glass of wine (148 mL), or one 1 oz glass of hard liquor (44 mL). Lifestyle Do not use any products that contain nicotine or tobacco. These products include cigarettes, chewing tobacco, and vaping devices, such as e-cigarettes. If you need help quitting, ask your health care provider. Do not use street drugs. Do not share needles. Ask your health care provider for help if you need support or information about quitting drugs. General instructions Schedule regular health, dental, and eye exams. Stay current with your vaccines. Tell your health care provider if: You often feel depressed. You have ever been abused or do not feel safe at home. Summary Adopting a healthy lifestyle and getting preventive care are important in promoting health and wellness. Follow your health care provider's instructions about healthy diet, exercising, and getting tested or screened for diseases. Follow your health care provider's instructions on monitoring your cholesterol and blood pressure. This information is not intended to replace advice given to you by your health care provider. Make sure you discuss any questions you have with your health care provider. Document Revised: 11/11/2020 Document Reviewed: 11/11/2020 Elsevier Patient Education  2023 [employer]  

## 2022-04-07 DIAGNOSIS — Z96641 Presence of right artificial hip joint: Secondary | ICD-10-CM | POA: Diagnosis not present

## 2022-06-22 ENCOUNTER — Other Ambulatory Visit: Payer: Self-pay | Admitting: Family Medicine

## 2022-08-05 ENCOUNTER — Encounter: Payer: Self-pay | Admitting: Family Medicine

## 2022-08-05 ENCOUNTER — Ambulatory Visit (INDEPENDENT_AMBULATORY_CARE_PROVIDER_SITE_OTHER): Payer: Medicare HMO | Admitting: Family Medicine

## 2022-08-05 VITALS — BP 132/56 | HR 85 | Temp 98.2°F | Wt 233.4 lb

## 2022-08-05 DIAGNOSIS — Z79899 Other long term (current) drug therapy: Secondary | ICD-10-CM | POA: Diagnosis not present

## 2022-08-05 DIAGNOSIS — M7989 Other specified soft tissue disorders: Secondary | ICD-10-CM

## 2022-08-05 DIAGNOSIS — I10 Essential (primary) hypertension: Secondary | ICD-10-CM

## 2022-08-05 DIAGNOSIS — M1990 Unspecified osteoarthritis, unspecified site: Secondary | ICD-10-CM | POA: Diagnosis not present

## 2022-08-05 DIAGNOSIS — M79641 Pain in right hand: Secondary | ICD-10-CM | POA: Diagnosis not present

## 2022-08-05 DIAGNOSIS — E781 Pure hyperglyceridemia: Secondary | ICD-10-CM | POA: Diagnosis not present

## 2022-08-05 DIAGNOSIS — Z6841 Body Mass Index (BMI) 40.0 and over, adult: Secondary | ICD-10-CM | POA: Diagnosis not present

## 2022-08-05 DIAGNOSIS — Z8249 Family history of ischemic heart disease and other diseases of the circulatory system: Secondary | ICD-10-CM | POA: Diagnosis not present

## 2022-08-05 MED ORDER — ATORVASTATIN CALCIUM 10 MG PO TABS: 10.0000 mg | ORAL_TABLET | Freq: Every day | ORAL | 1 refills | Status: DC

## 2022-08-05 MED ORDER — DICLOFENAC SODIUM 75 MG PO TBEC: 75.0000 mg | DELAYED_RELEASE_TABLET | Freq: Two times a day (BID) | ORAL | 1 refills | Status: DC

## 2022-08-05 MED ORDER — LISINOPRIL 40 MG PO TABS: 40.0000 mg | ORAL_TABLET | Freq: Every day | ORAL | 1 refills | Status: DC

## 2022-08-05 NOTE — Patient Instructions (Signed)
No follow-ups on file.        Great to see you today.  I have refilled the medication(s) we provide.   If labs were collected, we will inform you of lab results once received either by echart message or telephone call.   - echart message- for normal results that have been seen by the patient already.   - telephone call: abnormal results or if patient has not viewed results in their echart.  

## 2022-08-05 NOTE — Progress Notes (Signed)
Patient ID: Paul Phillips, male  DOB: [birth year], 72 y.o.   MRN: [id number] Patient Care Team    Relationship Specialty Notifications Start End  Ma Hillock, DO PCP - General Family Medicine  06/19/16   Orbie Hurst, MD Referring Physician Dermatology  12/21/18   Rondel Jumbo, PA-C  Neurology  01/08/21   Pieter Partridge, DO Consulting Physician Neurology  01/08/21     Chief Complaint  Patient presents with   Hypertension    Pt is fasting    Subjective: Paul Phillips is a 72 y.o. male present for Chronic Conditions/illness Management All past medical history, surgical history, allergies, family history, immunizations, medications and social history were updated in the electronic medical record today. All recent labs, ED visits and hospitalizations within the last year were reviewed.  HTN/HLD/Morbid obesity/thrombocytopenia: Pt reports compliance with lisinopril 40 mg QD.  Patient denies chest pain, shortness of breath, dizziness or lower extremity edema.  Pt takes a daily baby ASA. Pt is  prescribed statin *** Diet: Started watching his diet very closely. Exercise: Is exercising routinely.   RF: Hypertension, hypertriglyceridemia, former smoker, strong family history of heart disease, stroke and aneurysms   Arthritis:  Pt reports voltaren ***       07/21/2021    2:33 PM 07/15/2020    2:32 PM 06/26/2019    2:56 PM 12/21/2018    2:49 PM 12/10/2017    2:57 PM  Depression screen PHQ 2/9  Decreased Interest 0 0 0 0 0  Down, Depressed, Hopeless 0 0 0 0   PHQ - 2 Score 0 0 0 0 0  Altered sleeping 2      Tired, decreased energy 0      Change in appetite 0      Feeling bad or failure about yourself  0      Trouble concentrating 0      Moving slowly or fidgety/restless 0      Suicidal thoughts 0      PHQ-9 Score 2          07/21/2021    2:34 PM  GAD 7 : Generalized Anxiety Score  Nervous, Anxious, on Edge 0  Control/stop worrying 0  Worry too much  - different things 0  Trouble relaxing 0  Restless 0  Easily annoyed or irritable 0  Afraid - awful might happen 0  Total GAD 7 Score 0          03/11/2022    6:41 AM 03/11/2022    6:40 AM 12/30/2021    2:51 PM 01/08/2021    2:55 PM 07/15/2020    2:32 PM  Fall Risk   Falls in the past year? 0 0 0 0 0  Number falls in past yr:  0 0 0 0  Injury with Fall?  0 0 0 0  Risk for fall due to :   No Fall Risks    Follow up   Falls evaluation completed      Immunization History  Administered Date(s) Administered   Fluad Quad(high Dose 65+) 05/12/2022   Hepatitis B, PED/ADOLESCENT 09/22/2013   Influenza-Unspecified 04/05/2017, 04/26/2018, 03/07/2019, 04/05/2020, 05/06/2021   PFIZER(Purple Top)SARS-COV-2 Vaccination 09/03/2019, 10/03/2019, 06/24/2020   Pneumococcal Conjugate-13 07/03/2016   Pneumococcal Polysaccharide-23 06/22/2018   Tdap 07/03/2016   Zoster Recombinat (Shingrix) 07/15/2020, 11/08/2020     Past Medical History:  Diagnosis Date   Dislocated thumb    GERD (gastroesophageal reflux disease)  Heart murmur    Hypertension    Osteoarthritis of right hip    Osteoarthritis, multiple sites    hands and hips   Osteonecrosis of right hip (HCC)    pt to get hip replacement by [facility] as of 03/2021   Sleep disturbance 06/22/2018   No Known Allergies Past Surgical History:  Procedure Laterality Date   COLONOSCOPY  10/26/2016   TA   right total hip arthroplasty Right 04/11/2021   Family History  Problem Relation Age of Onset   Arthritis Mother    Diabetes Mother    Hearing loss Mother    Stroke Mother    Hearing loss Father    Heart disease Father    AAA (abdominal aortic aneurysm) Father    Aneurysm Sister 73       brain, died from aneurysm suddenly.    Heart attack Brother    Arthritis/Rheumatoid Maternal Grandmother    Brain cancer Maternal Grandfather    Colon cancer Neg Hx    Colon polyps Neg Hx    Esophageal cancer Neg Hx    Stomach cancer Neg Hx     Rectal cancer Neg Hx    Social History   Social History Narrative   Married to [place]. 1 adult child [place].   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    Former smoker, quit 2001   Drinks caffeine, takes a daily vitamin.   Wear seatbelt. Smoke detector in the home. Firearms in the home.   safe in his relationships.   Right handed     Allergies as of 08/05/2022   No Known Allergies      Medication List        Accurate as of August 05, 2022  3:13 PM. If you have any questions, ask your nurse or doctor.          atorvastatin 10 MG tablet Commonly known as: LIPITOR Take 1 tablet (10 mg total) by mouth daily.   cyanocobalamin 1000 MCG tablet Commonly known as: VITAMIN B12 Take 2,500 mcg by mouth daily.   diclofenac 75 MG EC tablet Commonly known as: VOLTAREN Take 1 tablet (75 mg total) by mouth 2 (two) times daily.   lisinopril 40 MG tablet Commonly known as: ZESTRIL Take 1 tablet (40 mg total) by mouth daily.   Mens 50+ Multi Vitamin/Min Tabs Take 1 tablet by mouth daily.       All past medical history, surgical history, allergies, family history, immunizations andmedications were updated in the EMR today and reviewed under the history and medication portions of their EMR.     No results found for this or any previous visit (from the past 2160 hour(s)).  No results found.  ROS 14 pt review of systems performed and negative (unless mentioned in an HPI)  Objective: BP (!) 132/56   Pulse 85   Temp 98.2 F (36.8 C)   Wt 233 lb 6.4 oz (105.9 kg)   SpO2 96%   BMI 38.84 kg/m  Physical Exam Vitals and nursing note reviewed.  Constitutional:      General: He is not in acute distress.    Appearance: Normal appearance. He is not ill-appearing, toxic-appearing or diaphoretic.  HENT:     Head: Normocephalic and atraumatic.  Eyes:     General: No scleral icterus.       Right eye: No discharge.        Left eye: No discharge.     Extraocular Movements:  Extraocular movements intact.  Pupils: Pupils are equal, round, and reactive to light.  Cardiovascular:     Rate and Rhythm: Normal rate and regular rhythm.  Pulmonary:     Effort: Pulmonary effort is normal. No respiratory distress.     Breath sounds: Normal breath sounds. No wheezing, rhonchi or rales.  Musculoskeletal:     Right lower leg: No edema.     Left lower leg: No edema.  Skin:    General: Skin is warm and dry.     Coloration: Skin is not jaundiced or pale.     Findings: No rash.  Neurological:     Mental Status: He is alert and oriented to person, place, and time. Mental status is at baseline.  Psychiatric:        Mood and Affect: Mood normal.        Behavior: Behavior normal.        Thought Content: Thought content normal.        Judgment: Judgment normal.     No results found.  Assessment/plan: Paul Phillips is a 72 y.o. male present for Chronic Conditions/illness Management Hypertension/morbid obesity/Statin declinedFamily history of abdominal aortic aneurysm (AAA)/hypertriglycerides:  stable Continue  lisinopril to 40 mg.  Strongly encourage exercise and dietary modifications. Low sodium diet.  Lipitor 10 mg ***, LDL at goal with med. - AAA screen q 5 yrs due 03/2023 - f/u 5.5 mos.  (cpe)   Arthritis/hand swelling arthritis:  Stable Stable continue Voltaren.  Plts have been stable on medication. (His plts clump frequently)  Return in about 24 weeks (around 01/20/2023) for cpe (20 min), Routine chronic condition follow-up.  Orders Placed This Encounter  Procedures   ANA, IFA Comprehensive Panel-([facility])   Cyclic citrul peptide antibody, IgG ([facility])   Rheumatoid Factor   Sedimentation rate   Lipid panel   TSH   CBC w/Diff   Comp Met ([facility])   Hemoglobin A1c    Meds ordered this encounter  Medications   lisinopril (ZESTRIL) 40 MG tablet    Sig: Take 1 tablet (40 mg total) by mouth daily.    Dispense:  90 tablet    Refill:  1    atorvastatin (LIPITOR) 10 MG tablet    Sig: Take 1 tablet (10 mg total) by mouth daily.    Dispense:  90 tablet    Refill:  1   diclofenac (VOLTAREN) 75 MG EC tablet    Sig: Take 1 tablet (75 mg total) by mouth 2 (two) times daily.    Dispense:  180 tablet    Refill:  1    Please do not send request for renewal or a year supply. If pt needs refills more than prescribed,it is time for patients follow up with the provider and they should call the office for an appt. Thanks   Referral Orders  No referral(s) requested today    Note is dictated utilizing voice recognition software. Although note has been proof read prior to signing, occasional typographical errors still can be missed. If any questions arise, please do not hesitate to call for verification.  Electronically signed by: Howard Pouch, DO [place]

## 2022-08-06 LAB — SEDIMENTATION RATE: Sed Rate: 2 mm/h (ref 0–20)

## 2022-08-07 ENCOUNTER — Telehealth: Payer: Self-pay | Admitting: Family Medicine

## 2022-08-07 LAB — CBC WITH DIFFERENTIAL/PLATELET
Absolute Monocytes: 628 cells/uL (ref 200–950)
Basophils Absolute: 29 cells/uL (ref 0–200)
Basophils Relative: 0.4 %
Eosinophils Absolute: 37 cells/uL (ref 15–500)
Eosinophils Relative: 0.5 %
HCT: 45.3 % (ref 38.5–50.0)
Hemoglobin: 15.7 g/dL (ref 13.2–17.1)
Lymphs Abs: 1424 cells/uL (ref 850–3900)
MCH: 30.4 pg (ref 27.0–33.0)
MCHC: 34.7 g/dL (ref 32.0–36.0)
MCV: 87.6 fL (ref 80.0–100.0)
MPV: 12 fL (ref 7.5–12.5)
Monocytes Relative: 8.6 %
Neutro Abs: 5183 cells/uL (ref 1500–7800)
Neutrophils Relative %: 71 %
Platelets: 151 10*3/uL (ref 140–400)
RBC: 5.17 10*6/uL (ref 4.20–5.80)
RDW: 13.1 % (ref 11.0–15.0)
Total Lymphocyte: 19.5 %
WBC: 7.3 10*3/uL (ref 3.8–10.8)

## 2022-08-07 LAB — COMPREHENSIVE METABOLIC PANEL
AG Ratio: 1.9 (calc) (ref 1.0–2.5)
ALT: 20 U/L (ref 9–46)
AST: 21 U/L (ref 10–35)
Albumin: 4.2 g/dL (ref 3.6–5.1)
Alkaline phosphatase (APISO): 62 U/L (ref 35–144)
BUN: 25 mg/dL (ref 7–25)
CO2: 20 mmol/L (ref 20–32)
Calcium: 10.4 mg/dL — ABNORMAL HIGH (ref 8.6–10.3)
Chloride: 107 mmol/L (ref 98–110)
Creat: 1.12 mg/dL (ref 0.70–1.35)
Globulin: 2.2 g/dL (calc) (ref 1.9–3.7)
Glucose, Bld: 84 mg/dL (ref 65–99)
Potassium: 4.4 mmol/L (ref 3.5–5.3)
Sodium: 137 mmol/L (ref 135–146)
Total Bilirubin: 0.7 mg/dL (ref 0.2–1.2)
Total Protein: 6.4 g/dL (ref 6.1–8.1)

## 2022-08-07 LAB — ANA, IFA COMPREHENSIVE PANEL
Anti Nuclear Antibody (ANA): NEGATIVE
ENA SM Ab Ser-aCnc: <1 AI
SM/RNP: <1 AI
SSA (Ro) (ENA) Antibody, IgG: <1 AI
SSB (La) (ENA) Antibody, IgG: <1 AI
Scleroderma (Scl-70) (ENA) Antibody, IgG: <1 AI
ds DNA Ab: <1 IU/mL

## 2022-08-07 LAB — HEMOGLOBIN A1C
Hgb A1c MFr Bld: 5.3 % of total Hgb (ref <5.7)
Mean Plasma Glucose: 105 mg/dL
eAG (mmol/L): 5.8 mmol/L

## 2022-08-07 LAB — LIPID PANEL
Cholesterol: 129 mg/dL (ref <200)
HDL: 46 mg/dL (ref >=40)
LDL Cholesterol (Calc): 57 mg/dL (calc)
Non-HDL Cholesterol (Calc): 83 mg/dL (calc) (ref <130)
Total CHOL/HDL Ratio: 2.8 (calc) (ref <5.0)
Triglycerides: 185 mg/dL — ABNORMAL HIGH (ref <150)

## 2022-08-07 LAB — RHEUMATOID FACTOR: Rheumatoid fact SerPl-aCnc: <14 IU/mL (ref <14)

## 2022-08-07 LAB — CYCLIC CITRUL PEPTIDE ANTIBODY, IGG: Cyclic Citrullin Peptide Ab: <16 UNITS

## 2022-08-07 LAB — EXTRA BLUE TOP TUBE

## 2022-08-07 LAB — TSH: TSH: 1.12 mIU/L (ref 0.40–4.50)

## 2022-08-07 NOTE — Telephone Encounter (Signed)
Please call patient Liver, kidney and thyroid function are normal Blood cell counts and electrolytes are normal Diabetes A1c is normal  Cholesterol panel is at goal. Inflammatory marker and autoimmune panel is normal so far. Rheumatoid factor is negative. There is still 1 lab pending, we may not get back to Monday.  We will call him with results once received.

## 2022-08-07 NOTE — Telephone Encounter (Signed)
Spoke with patient regarding results/recommendations.  

## 2023-01-20 ENCOUNTER — Encounter: Payer: Self-pay | Admitting: Family Medicine

## 2023-01-20 ENCOUNTER — Ambulatory Visit (INDEPENDENT_AMBULATORY_CARE_PROVIDER_SITE_OTHER): Payer: Medicare HMO | Admitting: Family Medicine

## 2023-01-20 VITALS — BP 140/60 | HR 69 | Temp 98.5°F | Ht 65.0 in | Wt 237.6 lb

## 2023-01-20 DIAGNOSIS — Z8249 Family history of ischemic heart disease and other diseases of the circulatory system: Secondary | ICD-10-CM | POA: Diagnosis not present

## 2023-01-20 DIAGNOSIS — Z6841 Body Mass Index (BMI) 40.0 and over, adult: Secondary | ICD-10-CM | POA: Diagnosis not present

## 2023-01-20 DIAGNOSIS — Z0000 Encounter for general adult medical examination without abnormal findings: Secondary | ICD-10-CM

## 2023-01-20 DIAGNOSIS — Z87891 Personal history of nicotine dependence: Secondary | ICD-10-CM | POA: Diagnosis not present

## 2023-01-20 DIAGNOSIS — Z125 Encounter for screening for malignant neoplasm of prostate: Secondary | ICD-10-CM

## 2023-01-20 DIAGNOSIS — I10 Essential (primary) hypertension: Secondary | ICD-10-CM

## 2023-01-20 DIAGNOSIS — E781 Pure hyperglyceridemia: Secondary | ICD-10-CM

## 2023-01-20 DIAGNOSIS — M1990 Unspecified osteoarthritis, unspecified site: Secondary | ICD-10-CM | POA: Diagnosis not present

## 2023-01-20 LAB — CBC WITH DIFFERENTIAL/PLATELET
Basophils Absolute: 0 10*3/uL (ref 0.0–0.1)
Basophils Relative: 0.3 % (ref 0.0–3.0)
Eosinophils Absolute: 0.1 10*3/uL (ref 0.0–0.7)
Eosinophils Relative: 1.1 % (ref 0.0–5.0)
HCT: 43.2 % (ref 39.0–52.0)
Hemoglobin: 14.4 g/dL (ref 13.0–17.0)
Lymphocytes Relative: 22.7 % (ref 12.0–46.0)
Lymphs Abs: 1.3 10*3/uL (ref 0.7–4.0)
MCHC: 33.3 g/dL (ref 30.0–36.0)
MCV: 90 fl (ref 78.0–100.0)
Monocytes Absolute: 0.6 10*3/uL (ref 0.1–1.0)
Monocytes Relative: 9.7 % (ref 3.0–12.0)
Neutro Abs: 3.8 10*3/uL (ref 1.4–7.7)
Neutrophils Relative %: 66.2 % (ref 43.0–77.0)
Platelets: 166 10*3/uL (ref 150.0–400.0)
RBC: 4.8 Mil/uL (ref 4.22–5.81)
RDW: 14.2 % (ref 11.5–15.5)
WBC: 5.7 10*3/uL (ref 4.0–10.5)

## 2023-01-20 LAB — BASIC METABOLIC PANEL
BUN: 21 mg/dL (ref 6–23)
CO2: 21 mEq/L (ref 19–32)
Calcium: 10.3 mg/dL (ref 8.4–10.5)
Chloride: 109 mEq/L (ref 96–112)
Creatinine, Ser: 1.07 mg/dL (ref 0.40–1.50)
GFR: 70.61 mL/min (ref >60.00)
Glucose, Bld: 97 mg/dL (ref 70–99)
Potassium: 4.2 mEq/L (ref 3.5–5.1)
Sodium: 138 mEq/L (ref 135–145)

## 2023-01-20 LAB — PSA: PSA: 1.65 ng/mL (ref 0.10–4.00)

## 2023-01-20 IMAGING — MR MR HIP*R* W/O CM
4 of 5 series · 18 of 40 positions shown · non-contrast
Comparison: Right hip x-rays dated February 12, 2021.

CLINICAL DATA: Right hip pain for the past 3 weeks. No injury or
prior surgery.

EXAM:
MR OF THE RIGHT HIP WITHOUT CONTRAST
TECHNIQUE: Multiplanar, multisequence MR imaging was performed. No intravenous
contrast was administered.

[Series 3: T1 · coronal · 4.0mm · 0.59mm/px · 3 of 40 slices shown]
[im 5/40]
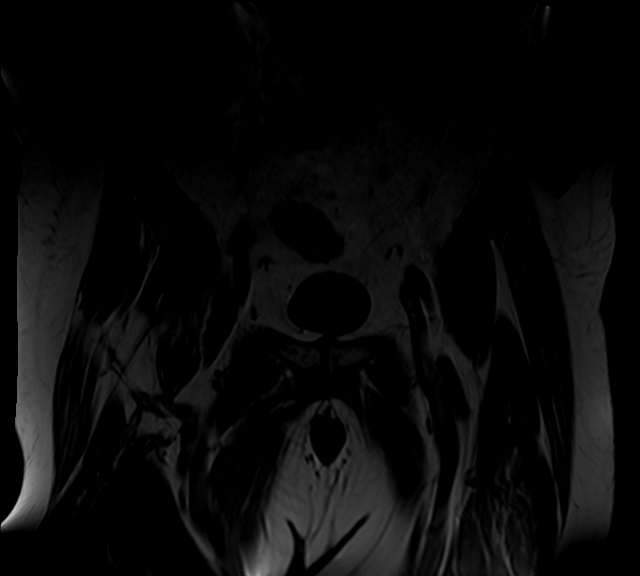
[im 22/40]
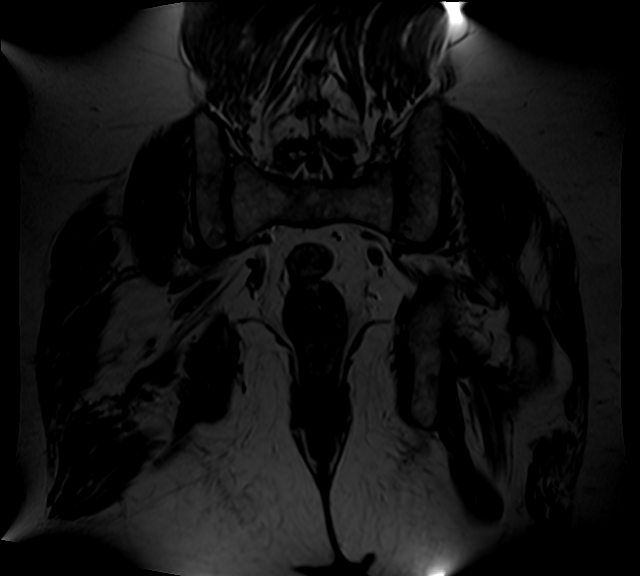
[im 35/40]
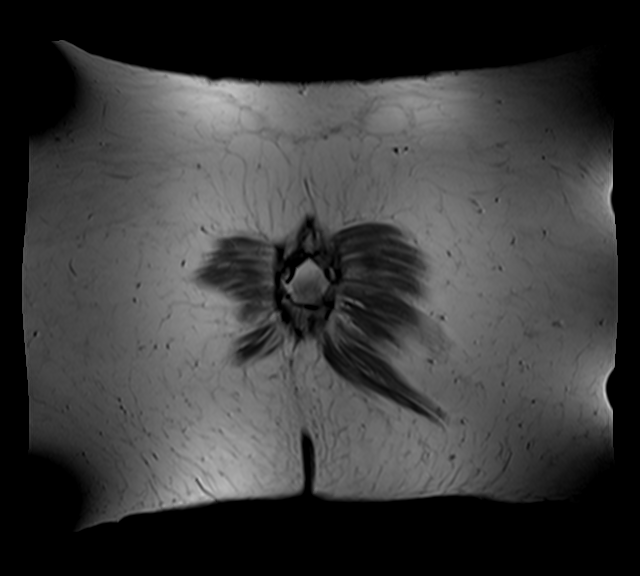

[Series 4: STIR · coronal · 4.0mm · 0.74mm/px · 3 of 40 slices shown]
[im 5/40]
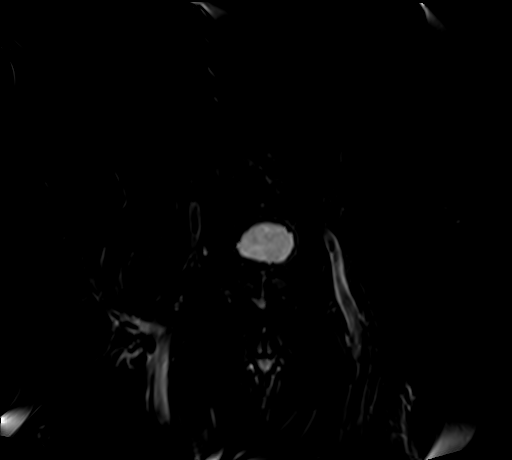
[im 22/40]
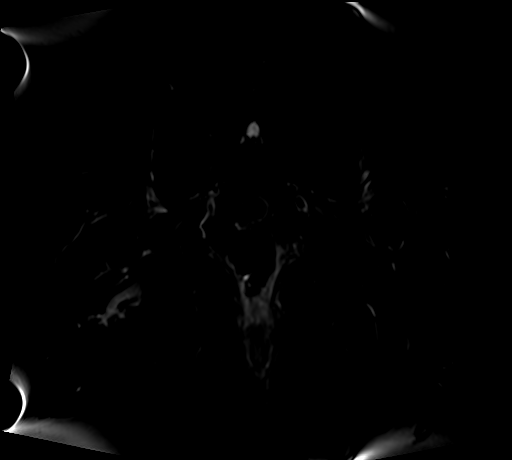
[im 35/40]
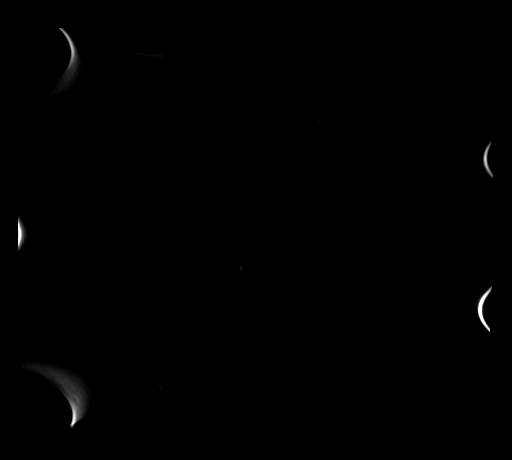

[Series 7: PD fat-sat · sagittal · 4.5mm · 0.35mm/px · 6 of 25 slices shown (1 of 2)]
[im 1/25]
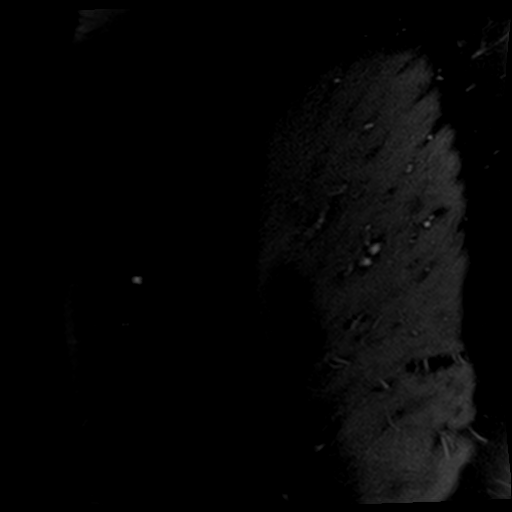
[im 5/25]
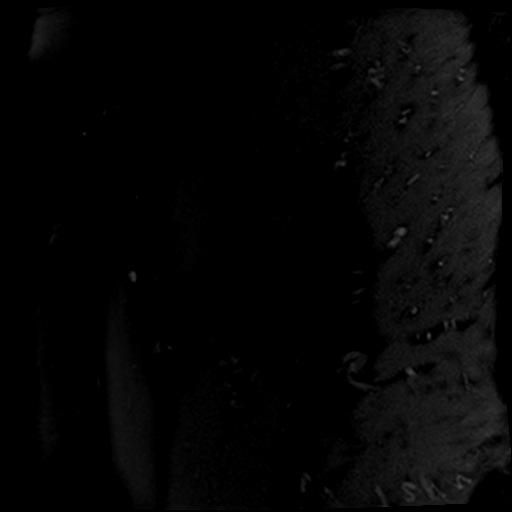
[im 10/25]
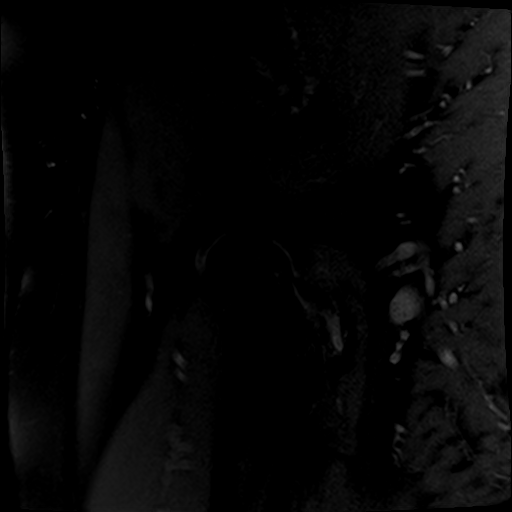
[im 15/25]
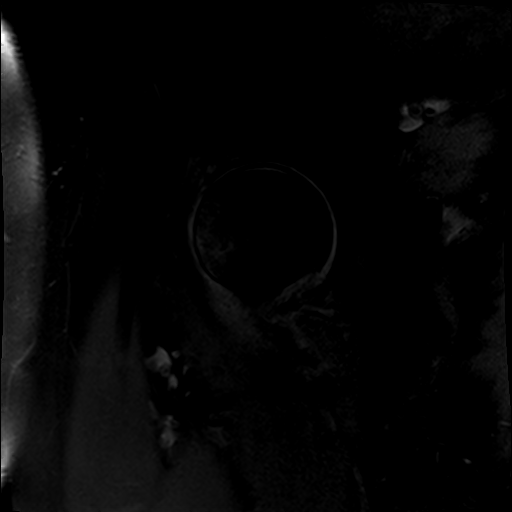
[im 20/25]
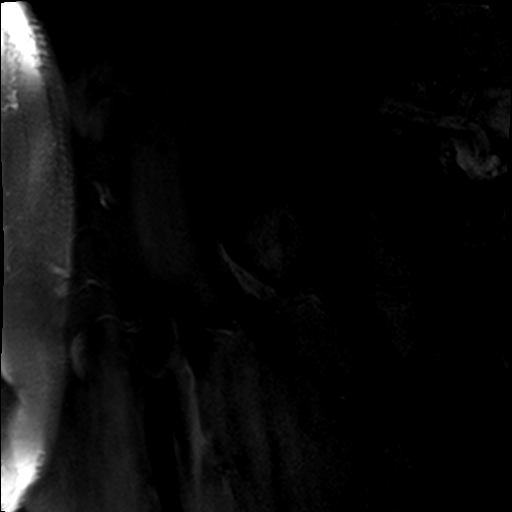
[im 25/25]
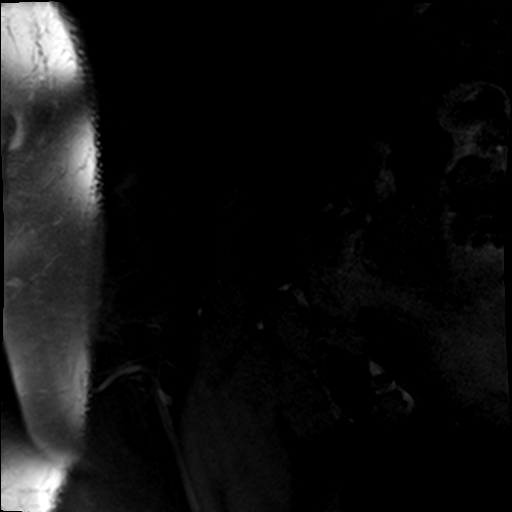

[Series 8: PD fat-sat · coronal · 4.5mm · 0.35mm/px · 6 of 23 slices shown (2 of 2)]
[im 1/23]
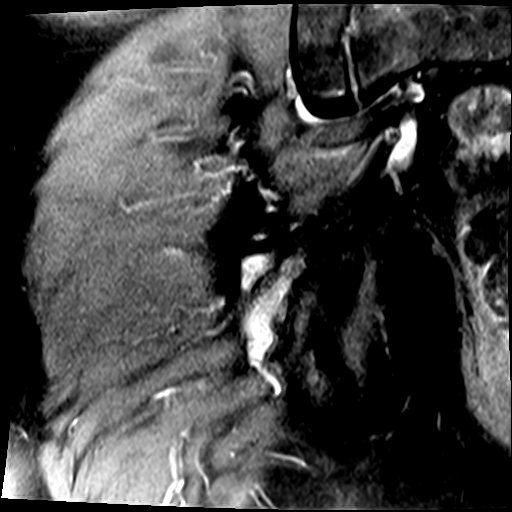
[im 5/23]
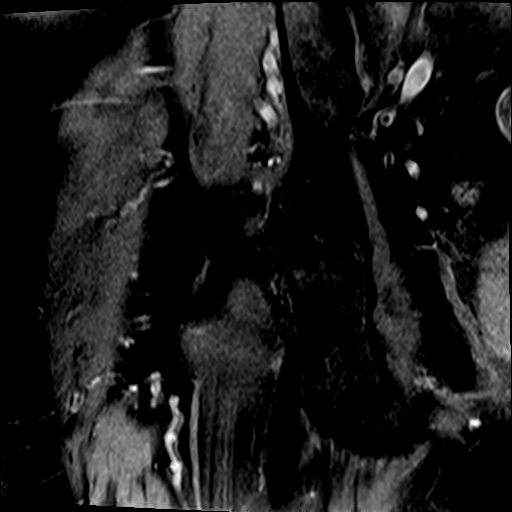
[im 9/23]
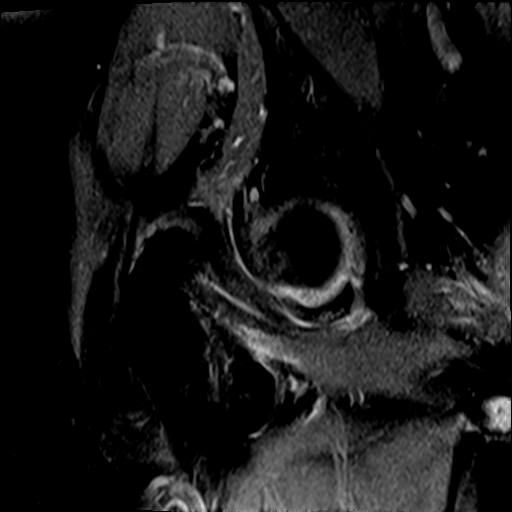
[im 14/23]
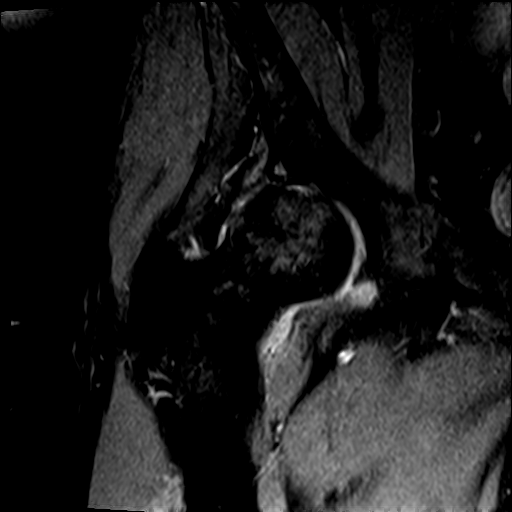
[im 18/23]
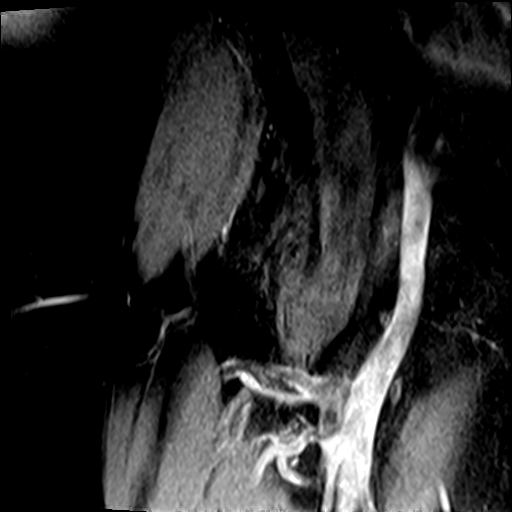
[im 23/23]
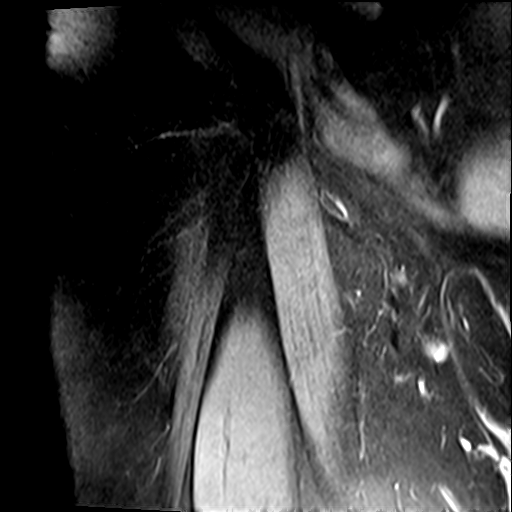

[18 of 40 positions shown; findings below may reference images not displayed]

FINDINGS: Bones: Inner linear subchondral T2 hyperintensity with adjacent
peripheral linear T2 hypointensity in the superior right femoral
head with surrounding marrow edema (series 8, image 12; series 7,
image 11), consistent with avascular necrosis. No acute fracture or
dislocation. No focal bone lesion. The visualized sacroiliac joints
and symphysis pubis appear normal.

Articular cartilage and labrum

Articular cartilage: Prominent near full-thickness cartilage loss in
the superior right hip joint with near bone-on-bone apposition.

Labrum: Degenerated and torn right anterior superior labrum.

Joint or bursal effusion

Joint effusion: Trace right hip joint effusion.

Bursae: No focal periarticular fluid collection.

Muscles and tendons

Muscles and tendons: Partial tear of the right rectus
abdominus-adductor longus aponeurosis (series 4, images 33-37). Tiny
partial tear of the right hamstring tendon origin. The gluteal and
iliopsoas tendons are unremarkable. No muscle edema or atrophy.

Other findings

Miscellaneous: The visualized internal pelvic contents appear
unremarkable.
IMPRESSION: 1. Early acute right femoral head avascular necrosis.
2. Moderate right hip osteoarthritis.
3. Partial tear of the right rectus abdominus-adductor longus
aponeurosis (athletic pubalgia).

## 2023-01-20 MED ORDER — AMLODIPINE BESYLATE 2.5 MG PO TABS: 2.5000 mg | ORAL_TABLET | Freq: Every day | ORAL | 1 refills | Status: DC

## 2023-01-20 MED ORDER — ATORVASTATIN CALCIUM 10 MG PO TABS: 10.0000 mg | ORAL_TABLET | Freq: Every day | ORAL | 1 refills | Status: DC

## 2023-01-20 MED ORDER — DICLOFENAC SODIUM 75 MG PO TBEC: 75.0000 mg | DELAYED_RELEASE_TABLET | Freq: Two times a day (BID) | ORAL | 1 refills | Status: DC

## 2023-01-20 MED ORDER — LISINOPRIL 40 MG PO TABS: 40.0000 mg | ORAL_TABLET | Freq: Every day | ORAL | 1 refills | Status: DC

## 2023-01-20 NOTE — Patient Instructions (Addendum)
Return in about 24 weeks (around 07/07/2023) for Routine chronic condition follow-up.        Great to see you today.  I have refilled the medication(s) we provide.   If labs were collected, we will inform you of lab results once received either by echart message or telephone call.   - echart message- for normal results that have been seen by the patient already.   - telephone call: abnormal results or if patient has not viewed results in their echart.  If you are interested in weight loss counseling please make appt to discuss and bring with you 2 weeks of a food diary/log on notebook paper.  Food log is mandatory on first appt to proceed with counseling.  Weight loss counseling encompasses diet, exercise and can include  medications when appropriate and affordable.  There are routine appts for check-ins and weights to track progress and keep you on track.  Routine check-ins (in person) are also mandatory to continue with prescription refills. Check-in timeline  can range from 4 weeks to 12 weeks, depending on physician's recommendations and which step you are in of your weight loss journey.  Your BMI today is Body mass index is 39.54 kg/m.  Please check with your insurance prior to appt and ask them if they cover weight loss medications for your BMI? And if so, which medications. They may tell you some of the diabetes meds that are used for weight loss also,  are on your formulary- but this does not mean they are covered for weight loss only.  Even if they tell with a prior auth it is covered- make sure they check to see if you personally meet criteria with your BMI.

## 2023-01-20 NOTE — Progress Notes (Signed)
Patient ID: Paul Phillips, male  DOB: 12/29/1950, 72 y.o.   MRN: [id number] Patient Care Team    Relationship Specialty Notifications Start End  Natalia Leatherwood, DO PCP - General Family Medicine  06/19/16   Reginia Naas, MD Referring Physician Dermatology  12/21/18   Marcos Eke, PA-C  Neurology  01/08/21   Drema Dallas, DO Consulting Physician Neurology  01/08/21     Chief Complaint  Patient presents with   Annual Exam    Fasting CPE    Subjective: Paul Phillips is a 72 y.o. male present for CPE and routine chronic condition management All past medical history, surgical history, allergies, family history, immunizations, medications and social history were updated in the electronic medical record today. All recent labs, ED visits and hospitalizations within the last year were reviewed.  Health maintenance:  Colonoscopy: Completed 2021, by Dr. Russella Dar. Multiple polyps, 5 year follow-up recommended.  Immunizations:  tdap UTD 2017, influenza UTD,PNA series completed, shingrix series completed.  Infectious disease screening: HIV and Hep C negative (completed) PSA:  Lab Results  Component Value Date   PSA 1.38 07/21/2021   PSA 0.93 07/15/2020   PSA 0.79 06/26/2019  , pt was counseled on prostate cancer screenings Assistive device: none Oxygen WUJ:WJXB Patient has a Dental home. Hospitalizations/ED visits: Reviewed  HTN/HLD/Morbid obesity/thrombocytopenia: Pt reports compliance with  with  lisnopril 40 mg QD. Patient denies chest pain, shortness of breath, dizziness or lower extremity edema.  Pt takes a daily baby ASA. Pt is  prescribed statin Diet: Started watching his diet very closely. Exercise: Is exercising routinely.   RF: Hypertension, hypertriglyceridemia, former smoker, strong family history of heart disease, stroke and aneurysms   Arthritis:  Pt reports voltaren is working well for his arthritis.  It is keeping him active.        10/20/2022    2:48 PM 07/21/2021    2:33 PM 07/15/2020    2:32 PM 06/26/2019    2:56 PM 12/21/2018    2:49 PM  Depression screen PHQ 2/9  Decreased Interest 0 0 0 0 0  Down, Depressed, Hopeless 0 0 0 0 0  PHQ - 2 Score 0 0 0 0 0  Altered sleeping  2     Tired, decreased energy  0     Change in appetite  0     Feeling bad or failure about yourself   0     Trouble concentrating  0     Moving slowly or fidgety/restless  0     Suicidal thoughts  0     PHQ-9 Score  2         07/21/2021    2:34 PM  GAD 7 : Generalized Anxiety Score  Nervous, Anxious, on Edge 0  Control/stop worrying 0  Worry too much - different things 0  Trouble relaxing 0  Restless 0  Easily annoyed or irritable 0  Afraid - awful might happen 0  Total GAD 7 Score 0            12/04/2022    8:10 AM 10/20/2022    2:48 PM 03/11/2022    6:41 AM 03/11/2022    6:40 AM 12/30/2021    2:51 PM  Fall Risk   Falls in the past year? 0 0 0 0 0  Number falls in past yr:  0  0 0  Injury with Fall?  0  0 0  Risk for  fall due to :     No Fall Risks  Follow up  Falls evaluation completed   Falls evaluation completed    Immunization History  Administered Date(s) Administered   Fluad Quad(high Dose 65+) 05/12/2022   Hepatitis B, PED/ADOLESCENT 09/22/2013   Influenza-Unspecified 04/05/2017, 04/26/2018, 03/07/2019, 04/05/2020, 05/06/2021   PFIZER(Purple Top)SARS-COV-2 Vaccination 09/03/2019, 10/03/2019, 06/24/2020   Pneumococcal Conjugate-13 07/03/2016   Pneumococcal Polysaccharide-23 06/22/2018   Tdap 07/03/2016   Zoster Recombinant(Shingrix) 07/15/2020, 11/08/2020     Past Medical History:  Diagnosis Date   Bell's palsy 12/31/2020   Dislocated thumb    GERD (gastroesophageal reflux disease)    Heart murmur    Hypertension    Osteoarthritis of right hip    Osteoarthritis, multiple sites    hands and hips   Osteonecrosis of right hip (HCC)    pt to get hip replacement by [facility] as of 03/2021   Sleep  disturbance 06/22/2018   No Known Allergies Past Surgical History:  Procedure Laterality Date   COLONOSCOPY  10/26/2016   TA   right total hip arthroplasty Right 04/11/2021   Family History  Problem Relation Age of Onset   Arthritis Mother    Diabetes Mother    Hearing loss Mother    Stroke Mother    Hearing loss Father    Heart disease Father    AAA (abdominal aortic aneurysm) Father    Aneurysm Sister 56       brain, died from aneurysm suddenly.    Heart attack Brother    Arthritis/Rheumatoid Maternal Grandmother    Brain cancer Maternal Grandfather    Colon cancer Neg Hx    Colon polyps Neg Hx    Esophageal cancer Neg Hx    Stomach cancer Neg Hx    Rectal cancer Neg Hx    Social History   Social History Narrative   Married to Paul Phillips. 1 adult child Paul Phillips.   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    Former smoker, quit 2001   Drinks caffeine, takes a daily vitamin.   Wear seatbelt. Smoke detector in the home. Firearms in the home.   safe in his relationships.   Right handed     Allergies as of 01/20/2023   No Known Allergies      Medication List        Accurate as of January 20, 2023  2:22 PM. If you have any questions, ask your nurse or doctor.          amLODipine 2.5 MG tablet Commonly known as: NORVASC Take 1 tablet (2.5 mg total) by mouth daily. Started by: Felix Pacini   atorvastatin 10 MG tablet Commonly known as: LIPITOR Take 1 tablet (10 mg total) by mouth daily.   cyanocobalamin 1000 MCG tablet Commonly known as: VITAMIN B12 Take 2,500 mcg by mouth daily.   diclofenac 75 MG EC tablet Commonly known as: VOLTAREN Take 1 tablet (75 mg total) by mouth 2 (two) times daily.   lisinopril 40 MG tablet Commonly known as: ZESTRIL Take 1 tablet (40 mg total) by mouth daily.   Mens 50+ Multi Vitamin/Min Tabs Take 1 tablet by mouth daily.       All past medical history, surgical history, allergies, family history, immunizations  andmedications were updated in the EMR today and reviewed under the history and medication portions of their EMR.     No results found for this or any previous visit (from the past 2160 hour(s)).  No results found.  ROS 14 pt review of systems performed and negative (unless mentioned in an HPI)  Objective: BP (!) 140/60   Pulse 69   Temp 98.5 F (36.9 C) (Oral)   Ht 5\' 5"  (1.651 m)   Wt 237 lb 9.6 oz (107.8 kg)   SpO2 97%   BMI 39.54 kg/m  Physical Exam Vitals and nursing note reviewed.  Constitutional:      General: He is not in acute distress.    Appearance: Normal appearance. He is not ill-appearing, toxic-appearing or diaphoretic.  HENT:     Head: Normocephalic and atraumatic.     Right Ear: Tympanic membrane, ear canal and external ear normal. There is no impacted cerumen.     Left Ear: Tympanic membrane, ear canal and external ear normal. There is no impacted cerumen.     Nose: Nose normal. No congestion or rhinorrhea.     Mouth/Throat:     Mouth: Mucous membranes are moist.     Pharynx: Oropharynx is clear. No oropharyngeal exudate or posterior oropharyngeal erythema.  Eyes:     General: No scleral icterus.       Right eye: No discharge.        Left eye: No discharge.     Extraocular Movements: Extraocular movements intact.     Pupils: Pupils are equal, round, and reactive to light.  Cardiovascular:     Rate and Rhythm: Normal rate and regular rhythm.     Pulses: Normal pulses.     Heart sounds: Normal heart sounds. No murmur heard.    No friction rub. No gallop.  Pulmonary:     Effort: Pulmonary effort is normal. No respiratory distress.     Breath sounds: Normal breath sounds. No stridor. No wheezing, rhonchi or rales.  Chest:     Chest wall: No tenderness.  Abdominal:     General: Abdomen is flat. Bowel sounds are normal. There is no distension.     Palpations: Abdomen is soft. There is no mass.     Tenderness: There is no abdominal tenderness. There is no  right CVA tenderness, left CVA tenderness, guarding or rebound.     Hernia: No hernia is present.  Musculoskeletal:        General: No swelling or tenderness. Normal range of motion.     Cervical back: Normal range of motion and neck supple.     Right lower leg: No edema.     Left lower leg: No edema.  Lymphadenopathy:     Cervical: No cervical adenopathy.  Skin:    General: Skin is warm and dry.     Coloration: Skin is not jaundiced.     Findings: No bruising, lesion or rash.  Neurological:     General: No focal deficit present.     Mental Status: He is alert and oriented to person, place, and time. Mental status is at baseline.     Cranial Nerves: No cranial nerve deficit.     Sensory: No sensory deficit.     Motor: No weakness.     Coordination: Coordination normal.     Gait: Gait normal.     Deep Tendon Reflexes: Reflexes normal.  Psychiatric:        Mood and Affect: Mood normal.        Behavior: Behavior normal.        Thought Content: Thought content normal.        Judgment: Judgment normal.     No results found.  Assessment/plan: Meyer Russel  R Jan Fireman is a 72 y.o. male present for CPE and Chronic Conditions/illness Management Hypertension/morbid obesity/Statin declinedFamily history of abdominal aortic aneurysm (AAA)/hypertriglycerides:  Over goal Continue Lisinopril 40 mg  Add amlodipine 2.5 mg qd Low sodium diet.  Routine exercise.  CBC and BMP collected today - AAA screen q 5 yrs due 03/2023> ordered  Arthritis:  Stable Continue Voltaren. Plts have been stable on medication  Former smoker AAA screen due 03/2023 for follow up> ordered Ectatic abdominal aorta at risk for aneurysm development.  Routine general medical examination at a health care facility Patient was encouraged to exercise greater than 150 minutes a week. Patient was encouraged to choose a diet filled with fresh fruits and vegetables, and lean meats. AVS provided to patient today for  education/recommendation on gender specific health and safety maintenance. Colonoscopy: Completed 2021, by Dr. Russella Dar. Multiple polyps, 5 year follow-up recommended.  Immunizations:  tdap UTD 2017, influenza UTD,PNA series completed, shingrix series completed.  Infectious disease screening: HIV and Hep C negative (completed)   patient inquired about weight loss counseling and medications- encouraged him to set up appt for weight loss and provided him with required pre-visit work.   Return in about 24 weeks (around 07/07/2023) for Routine chronic condition follow-up.  Orders Placed This Encounter  Procedures   US AORTA DUPLEX COMPLETE   CT CARDIAC SCORING (SELF PAY ONLY)   CBC with Differential/Platelet   PSA   Basic Metabolic Panel (BMET)    Meds ordered this encounter  Medications   atorvastatin (LIPITOR) 10 MG tablet    Sig: Take 1 tablet (10 mg total) by mouth daily.    Dispense:  90 tablet    Refill:  1   diclofenac (VOLTAREN) 75 MG EC tablet    Sig: Take 1 tablet (75 mg total) by mouth 2 (two) times daily.    Dispense:  180 tablet    Refill:  1    Please do not send request for renewal or a year supply. If pt needs refills more than prescribed,it is time for patients follow up with the provider and they should call the office for an appt. Thanks   lisinopril (ZESTRIL) 40 MG tablet    Sig: Take 1 tablet (40 mg total) by mouth daily.    Dispense:  90 tablet    Refill:  1   amLODipine (NORVASC) 2.5 MG tablet    Sig: Take 1 tablet (2.5 mg total) by mouth daily.    Dispense:  90 tablet    Refill:  1   Referral Orders  No referral(s) requested today    Note is dictated utilizing voice recognition software. Although note has been proof read prior to signing, occasional typographical errors still can be missed. If any questions arise, please do not hesitate to call for verification.  Electronically signed by: Felix Pacini, DO [facility] [place]

## 2023-01-21 ENCOUNTER — Ambulatory Visit (INDEPENDENT_AMBULATORY_CARE_PROVIDER_SITE_OTHER): Payer: Self-pay

## 2023-01-21 DIAGNOSIS — I10 Essential (primary) hypertension: Secondary | ICD-10-CM

## 2023-01-21 DIAGNOSIS — E781 Pure hyperglyceridemia: Secondary | ICD-10-CM

## 2023-01-21 DIAGNOSIS — Z8249 Family history of ischemic heart disease and other diseases of the circulatory system: Secondary | ICD-10-CM

## 2023-01-21 DIAGNOSIS — Z87891 Personal history of nicotine dependence: Secondary | ICD-10-CM

## 2023-01-25 ENCOUNTER — Telehealth: Payer: Self-pay | Admitting: Family Medicine

## 2023-01-25 DIAGNOSIS — I2510 Atherosclerotic heart disease of native coronary artery without angina pectoris: Secondary | ICD-10-CM

## 2023-01-25 NOTE — Telephone Encounter (Signed)
Please call pt- his cardiac ct results have returned:   Coronary Calcium Score:259 Percentile: 63rd percentile for age/gender etc. This is an elevated result, and he is already prescribed a statin medication recommended.    Ascending Aorta: Mildly dilated measuring 42mm at the bifurcation of the main pulmonary artery.    Please have patient's schedule an appointment with this provider at his earliest convenience so that we can review the results and order/discuss the medications and weight loss he desired, and we will also need to place a new order for an image study for the repeat aortic image.  Per this image there was some growth in the dilation of his aorta and they are now recommending a CTA follow-up to be ordered.   -This would replace the abdominal ultrasound we had ordered for the routine follow-up.

## 2023-01-27 ENCOUNTER — Ambulatory Visit
Admission: RE | Admit: 2023-01-27 | Discharge: 2023-01-27 | Disposition: A | Discharge status: Home / Self Care | Payer: BC Managed Care – PPO | Source: Ambulatory Visit | Attending: Family Medicine | Admitting: Family Medicine

## 2023-01-27 DIAGNOSIS — Z87891 Personal history of nicotine dependence: Secondary | ICD-10-CM

## 2023-01-27 DIAGNOSIS — I10 Essential (primary) hypertension: Secondary | ICD-10-CM | POA: Diagnosis not present

## 2023-01-29 ENCOUNTER — Encounter: Payer: Self-pay | Admitting: Family Medicine

## 2023-01-29 ENCOUNTER — Ambulatory Visit (INDEPENDENT_AMBULATORY_CARE_PROVIDER_SITE_OTHER): Payer: Medicare HMO | Admitting: Family Medicine

## 2023-01-29 VITALS — BP 142/62 | HR 85 | Temp 98.3°F | Wt 239.4 lb

## 2023-01-29 DIAGNOSIS — R931 Abnormal findings on diagnostic imaging of heart and coronary circulation: Secondary | ICD-10-CM | POA: Insufficient documentation

## 2023-01-29 DIAGNOSIS — E781 Pure hyperglyceridemia: Secondary | ICD-10-CM

## 2023-01-29 DIAGNOSIS — I2510 Atherosclerotic heart disease of native coronary artery without angina pectoris: Secondary | ICD-10-CM

## 2023-01-29 DIAGNOSIS — I77810 Thoracic aortic ectasia: Secondary | ICD-10-CM

## 2023-01-29 DIAGNOSIS — I10 Essential (primary) hypertension: Secondary | ICD-10-CM

## 2023-01-29 DIAGNOSIS — Z87891 Personal history of nicotine dependence: Secondary | ICD-10-CM | POA: Diagnosis not present

## 2023-01-29 DIAGNOSIS — Z6841 Body Mass Index (BMI) 40.0 and over, adult: Secondary | ICD-10-CM | POA: Diagnosis not present

## 2023-01-29 DIAGNOSIS — Z8249 Family history of ischemic heart disease and other diseases of the circulatory system: Secondary | ICD-10-CM

## 2023-01-29 MED ORDER — WEGOVY 0.5 MG/0.5ML ~~LOC~~ SOAJ: 0.5000 mg | SUBCUTANEOUS | 0 refills | Status: DC

## 2023-01-29 MED ORDER — WEGOVY 1 MG/0.5ML ~~LOC~~ SOAJ: 1.0000 mg | SUBCUTANEOUS | 0 refills | Status: DC

## 2023-01-29 MED ORDER — AMLODIPINE BESYLATE 2.5 MG PO TABS: 5.0000 mg | ORAL_TABLET | Freq: Every day | ORAL | Status: DC

## 2023-01-29 MED ORDER — WEGOVY 0.25 MG/0.5ML ~~LOC~~ SOAJ: 0.2500 mg | SUBCUTANEOUS | 0 refills | Status: DC

## 2023-01-29 NOTE — Patient Instructions (Addendum)
Return in about 6 weeks (around 03/12/2023) for Routine chronic condition follow-up.  Take 2 amlodipine (total 5 mg)      Great to see you today.  I have refilled the medication(s) we provide.   If labs were collected or images ordered, we will inform you of  results once we have received them and reviewed. We will contact you either by echart message, or telephone call.  Please give ample time to the testing facility, and our office to run,  receive and review results. Please do not call inquiring of results, even if you can see them in your chart. We will contact you as soon as we are able. If it has been over 1 week since the test was completed, and you have not yet heard from [place], then please call us.    - echart message- for normal results that have been seen by the patient already.   - telephone call: abnormal results or if patient has not viewed results in their echart.  If a referral to a specialist was entered for you, please call us in 2 weeks if you have not heard from the specialist office to schedule.

## 2023-01-29 NOTE — Progress Notes (Signed)
Kierce, Cefalo 1951/05/19, 72 y.o., male MRN: [id number] Paul Phillips Care Team    Relationship Specialty Notifications Start End  Natalia Leatherwood, DO PCP - General Family Medicine  06/19/16   Reginia Naas, MD Referring Physician Dermatology  12/21/18   Marcos Eke, PA-C  Neurology  01/08/21   Drema Dallas, DO Consulting Physician Neurology  01/08/21     Chief Complaint  Paul Phillips presents with   CT results     Subjective: Paul Phillips is a 72 y.o. Pt presents for an OV to discuss coronary calcium score results and findings of ascending aorta dilated to 42 mm.  Paul Phillips is compliant with atorvastatin low-dose.  Paul Phillips would like to add on [facility] for cardiac protection, and to help him lose weight and become more healthy. Coronary Calcium Score:259 Percentile: 63rd percentile  Ascending Aorta: Mildly dilated measuring 42mm at the bifurcation of the main pulmonary artery. > CTA chest  She reports Paul Phillips currently does not exercise and is fairly sedentary.  Paul Phillips has not been watching Paul Phillips diet.  Narrative & Impression  01/27/2023 EXAM: ULTRASOUND OF ABDOMINAL AORTA FINDINGS: Abdominal aortic measurements as follows: Proximal:  2.3 x 2.5 cm Mid:  2.1 x 2.4 cm Distal:  1.9 x 2.4 cm Patent: Yes, peak systolic velocity is 45 cm/s Right common iliac artery: 1.3 cm Left common iliac artery: 1.3 cm IMPRESSION: No evidence of abdominal aortic aneurysm.  03/21/2018: ULTRASOUND OF ABDOMINAL AORTA FINDINGS: Abdominal aortic measurements as follows: Proximal:  2.6 x 2.5 cm Mid:  2.3 x 2.3 cm Distal:  2.1 x 2.5 cm IMPRESSION: Ectatic abdominal aorta at risk for aneurysm development. Recommend followup by ultrasound in 5 years.          10/20/2022    2:48 PM 07/21/2021    2:33 PM 07/15/2020    2:32 PM 06/26/2019    2:56 PM 12/21/2018    2:49 PM  Depression screen PHQ 2/9  Decreased Interest 0 0 0 0 0  Down, Depressed, Hopeless 0 0 0 0 0  PHQ - 2 Score 0 0 0 0 0   Altered sleeping  2     Tired, decreased energy  0     Change in appetite  0     Feeling bad or failure about yourself   0     Trouble concentrating  0     Moving slowly or fidgety/restless  0     Suicidal thoughts  0     PHQ-9 Score  2       No Known Allergies Social History   Social History Narrative   Married to [place]. 1 adult child [place].   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    Former smoker, quit 2001   Drinks caffeine, takes a daily vitamin.   Wear seatbelt. Smoke detector in the home. Firearms in the home.   safe in Paul Phillips relationships.   Right handed    Past Medical History:  Diagnosis Date   Bell's palsy 12/31/2020   Dislocated thumb    GERD (gastroesophageal reflux disease)    Heart murmur    Hypertension    Osteoarthritis of right hip    Osteoarthritis, multiple sites    hands and hips   Osteonecrosis of right hip (HCC)    pt to get hip replacement by [facility] as of 03/2021   Sleep disturbance 06/22/2018   Past Surgical History:  Procedure Laterality Date   COLONOSCOPY  10/26/2016   TA   right total hip arthroplasty Right 04/11/2021   Family History  Problem Relation Age of Onset   Arthritis Mother    Diabetes Mother    Hearing loss Mother    Stroke Mother    Hearing loss Father    Heart disease Father    AAA (abdominal aortic aneurysm) Father    Aneurysm Sister 66       brain, died from aneurysm suddenly.    Heart attack Brother    Arthritis/Rheumatoid Maternal Grandmother    Brain cancer Maternal Grandfather    Colon cancer Neg Hx    Colon polyps Neg Hx    Esophageal cancer Neg Hx    Stomach cancer Neg Hx    Rectal cancer Neg Hx    Allergies as of 01/29/2023   No Known Allergies      Medication List        Accurate as of January 29, 2023  3:02 PM. If you have any questions, ask your nurse or doctor.          amLODipine 2.5 MG tablet Commonly known as: NORVASC Take 1 tablet (2.5 mg total) by mouth daily.    atorvastatin 10 MG tablet Commonly known as: LIPITOR Take 1 tablet (10 mg total) by mouth daily.   cyanocobalamin 1000 MCG tablet Commonly known as: VITAMIN B12 Take 2,500 mcg by mouth daily.   diclofenac 75 MG EC tablet Commonly known as: VOLTAREN Take 1 tablet (75 mg total) by mouth 2 (two) times daily.   lisinopril 40 MG tablet Commonly known as: ZESTRIL Take 1 tablet (40 mg total) by mouth daily.   Mens 50+ Multi Vitamin/Min Tabs Take 1 tablet by mouth daily.   Wegovy 0.25 MG/0.5ML Soaj Generic drug: Semaglutide-Weight Management Inject 0.25 mg into the skin once a week. Started by: Felix Pacini   Wegovy 0.5 MG/0.5ML Soaj Generic drug: Semaglutide-Weight Management Inject 0.5 mg into the skin once a week. Start taking on: February 18, 2023 Started by: Felix Pacini   [facility] 1 MG/0.5ML Soaj Generic drug: Semaglutide-Weight Management Inject 1 mg into the skin once a week. Start taking on: March 08, 2023 Started by: Felix Pacini        All past medical history, surgical history, allergies, family history, immunizations andmedications were updated in the EMR today and reviewed under the history and medication portions of their EMR.     ROS Negative, with the exception of above mentioned in HPI   Objective:  BP (!) 142/62   Pulse 85   Temp 98.3 F (36.8 C)   Wt 239 lb 6.4 oz (108.6 kg)   SpO2 96%   BMI 39.84 kg/m  Body mass index is 39.84 kg/m. Physical Exam Vitals and nursing note reviewed.  Constitutional:      General: Paul Phillips is not in acute distress.    Appearance: Normal appearance. Paul Phillips is not ill-appearing, toxic-appearing or diaphoretic.  HENT:     Head: Normocephalic and atraumatic.  Eyes:     General: No scleral icterus.       Right eye: No discharge.        Left eye: No discharge.     Extraocular Movements: Extraocular movements intact.     Pupils: Pupils are equal, round, and reactive to light.  Cardiovascular:     Rate and Rhythm: Normal  rate and regular rhythm.  Pulmonary:     Effort: Pulmonary effort is normal. No respiratory distress.     Breath  sounds: Normal breath sounds. No wheezing, rhonchi or rales.  Musculoskeletal:     Right lower leg: No edema.     Left lower leg: No edema.  Skin:    General: Skin is warm.     Findings: No rash.  Neurological:     Mental Status: Paul Phillips is alert and oriented to person, place, and time. Mental status is at baseline.  Psychiatric:        Mood and Affect: Mood normal.        Behavior: Behavior normal.        Thought Content: Thought content normal.        Judgment: Judgment normal.      No results found. No results found. No results found for this or any previous visit (from the past 24 hour(s)).  Assessment/Plan: Ignatz Looney is a 72 y.o. male present for OV for  Coronary artery disease involving native coronary artery of native heart without angina pectoris/Morbid obesity with BMI of 40.0-44.9, adult (HCC)/Agatston coronary artery calcium score between 200 and 399 (259, 63rd%) -Continue atorvastatin at 10 mg daily may consider increasing after next visit [facility] taper 0.25 mg - 1 mg.  Paul Phillips will make a nurse visit for instruction on proper injection technique. -We discussed starting light exercise few times a week and focusing on, meats, fresh fruits and vegetables.  Avoiding all other forms of carbohydrates and sugars. Follow-up in 6 weeks with provider  Essential hypertension/FH: heart disease/CAD/HLD Continue lisinopril 40, increased Paul Phillips amlodipine to 5 mg.  Paul Phillips will need a new prescription at next visit to reflect this new dose.  Ascending aorta dilatation (HCC)/Family history of abdominal aortic aneurysm (AAA)/ Former smoker Discussed results of dilated ascending aorta at 42 mm.  There is a family history of aortic aneurysms. - CT Angio Chest W/Cm &/Or Wo Cm; Future   Reviewed expectations re: course of current medical issues. Discussed  self-management of symptoms. Outlined signs and symptoms indicating need for more acute intervention. Paul Phillips verbalized understanding and all questions were answered. Paul Phillips received an After-Visit Summary.    Orders Placed This Encounter  Procedures   CT Angio Chest W/Cm &/Or Wo Cm   Meds ordered this encounter  Medications   WEGOVY 0.25 MG/0.5ML SOAJ    Sig: Inject 0.25 mg into the skin once a week.    Dispense:  2 mL    Refill:  0   WEGOVY 0.5 MG/0.5ML SOAJ    Sig: Inject 0.5 mg into the skin once a week.    Dispense:  2 mL    Refill:  0   WEGOVY 1 MG/0.5ML SOAJ    Sig: Inject 1 mg into the skin once a week.    Dispense:  2 mL    Refill:  0   Referral Orders  No referral(s) requested today     Note is dictated utilizing voice recognition software. Although note has been proof read prior to signing, occasional typographical errors still can be missed. If any questions arise, please do not hesitate to call for verification.   electronically signed by:  Felix Pacini, DO  [facility] [place]

## 2023-02-01 ENCOUNTER — Other Ambulatory Visit: Payer: Self-pay | Admitting: Family Medicine

## 2023-02-02 ENCOUNTER — Telehealth: Payer: Self-pay | Admitting: Family Medicine

## 2023-02-02 NOTE — Telephone Encounter (Signed)
Paul Phillips (Key: [id number] [name] 0.25MG /0.5ML auto-injectors Status: PA Request Created: July 29th, 2024 [id number] Sent: July 30th, 2024  Waiting for insurance determination.

## 2023-02-02 NOTE — Telephone Encounter (Signed)
Patient called to check status of PA for [facility]. I advised him that a CMA will check the status and give him a call when an update is available.

## 2023-02-02 NOTE — Telephone Encounter (Addendum)
Your prior authorization for [name] has been approved!  Message from plan: PA Case: [id number], Status: Approved, Coverage Starts on: 07/06/2022 12:00:00 AM, Coverage Ends on: 07/06/2023 12:00:00 AM. Questions? Contact [phone].  Authorization Expiration Date: July 06, 2023.  Pt advised and scheduled nurse visit for [facility] use education

## 2023-02-03 ENCOUNTER — Ambulatory Visit: Payer: BC Managed Care – PPO

## 2023-02-03 ENCOUNTER — Telehealth: Payer: Self-pay

## 2023-02-03 NOTE — Telephone Encounter (Signed)
Pharmacy Patient Advocate Encounter  Received notification from [facility] that Prior Authorization for [facility] has been  APPROVED 07/30./2024 TO 12/31 /2024  PA #/Case ID/Reference #: [id number]

## 2023-02-03 NOTE — Progress Notes (Signed)
Patient came in today for [facility] teaching. It was demonstrated to patient with training pen:   Patient instructed to clean area with alcohol swab in circular motion Remove cap from syringe Hold syringe up to subcutaneous area (preferably back of thigh) insert & push plunger until all medication is administered. Syringe should be clear with no medication in it When finished, discard into sharps container or milk carton; when full can be taken to pharmacy for proper disposal    Patient was able to demonstrate back to me & was comfortable giving injection. If he has further questions he will call office.

## 2023-02-07 ENCOUNTER — Ambulatory Visit (HOSPITAL_BASED_OUTPATIENT_CLINIC_OR_DEPARTMENT_OTHER)
Admission: RE | Admit: 2023-02-07 | Discharge: 2023-02-07 | Disposition: A | Discharge status: Home / Self Care | Payer: BC Managed Care – PPO | Source: Ambulatory Visit | Attending: Family Medicine | Admitting: Family Medicine

## 2023-02-07 DIAGNOSIS — I2510 Atherosclerotic heart disease of native coronary artery without angina pectoris: Secondary | ICD-10-CM

## 2023-02-07 DIAGNOSIS — I77810 Thoracic aortic ectasia: Secondary | ICD-10-CM

## 2023-02-07 DIAGNOSIS — K8020 Calculus of gallbladder without cholecystitis without obstruction: Secondary | ICD-10-CM | POA: Diagnosis not present

## 2023-02-07 DIAGNOSIS — I10 Essential (primary) hypertension: Secondary | ICD-10-CM | POA: Diagnosis not present

## 2023-02-07 DIAGNOSIS — I517 Cardiomegaly: Secondary | ICD-10-CM | POA: Diagnosis not present

## 2023-02-07 DIAGNOSIS — Z8249 Family history of ischemic heart disease and other diseases of the circulatory system: Secondary | ICD-10-CM

## 2023-02-07 MED ORDER — IOHEXOL 350 MG/ML SOLN
75.0000 mL | Freq: Once | INTRAVENOUS | Status: AC | PRN
  Administered 2023-02-07: 75 mL via INTRAVENOUS

## 2023-02-08 ENCOUNTER — Telehealth: Payer: Self-pay | Admitting: Family Medicine

## 2023-02-08 DIAGNOSIS — M5134 Other intervertebral disc degeneration, thoracic region: Secondary | ICD-10-CM | POA: Insufficient documentation

## 2023-02-08 DIAGNOSIS — K8070 Calculus of gallbladder and bile duct without cholecystitis without obstruction: Secondary | ICD-10-CM | POA: Insufficient documentation

## 2023-02-08 NOTE — Telephone Encounter (Signed)
Please call patient The aortic root is 4.4 cm, this is a dilation and recommended annual imaging by CTA to follow.  Incidentally, it was noted that he has multiple gallstones in his gallbladder and the duct draining from the gallbladder.   - Please ask him if he has been experiencing any right upper quadrant pain of his abdomen? - All stones can cause obstruction of the duct if they are large enough.  When this occurs nausea, vomiting, right upper quadrant pain can occur.  -Caution has to be used with using Wegovy and anybody with gallbladder disease.   Therefore if has felt pain we need to get  him to surgery consult.  If he has no symptoms but starts to  feel any discomfort we need to know right away and he needs to be evaluated.  If it occurs after hours he needs to go to the emergency room.

## 2023-02-08 NOTE — Telephone Encounter (Signed)
Spoke with patient regarding results/recommendations.  

## 2023-02-17 ENCOUNTER — Telehealth: Payer: Self-pay | Admitting: Family Medicine

## 2023-02-17 NOTE — Telephone Encounter (Signed)
Patient is currently still working and his insurance was field not in the correct order. His BCBS should be primary since he is still working, and Paediatric nurse is secondary. He states that BCBS should've paid for the $90 copay in regards to his Wegovy. I informed him I am unsure if they can refile that, but it would be correct for the next time. I advised the patient to see if insurance would run it through again with his receipt and insurance card at the pharmacy. Patient is aware that for the next time it should be correct since its in the correct filing order.

## 2023-02-17 NOTE — Telephone Encounter (Signed)
Spoke with patient regarding results/recommendations.  

## 2023-02-17 NOTE — Telephone Encounter (Signed)
Noted  

## 2023-02-18 ENCOUNTER — Encounter (INDEPENDENT_AMBULATORY_CARE_PROVIDER_SITE_OTHER): Payer: Self-pay

## 2023-02-18 ENCOUNTER — Telehealth: Payer: Self-pay

## 2023-02-18 NOTE — Telephone Encounter (Signed)
Patient checking status of prior auth for his [facility] prescription.  I told him that I did not see where we have received anything from pharmacy requiring auth.  He is going to check with pharmacy and insurance company.  No call back is needed at this time.

## 2023-02-24 ENCOUNTER — Other Ambulatory Visit: Payer: Self-pay | Admitting: Family Medicine

## 2023-02-24 NOTE — Telephone Encounter (Signed)
Patient brought to office forms from [facility] regarding prior auth forms for [facility] to be completed by provider.  Would this go to Prior Auth team? Or would our office handle this request.

## 2023-02-25 ENCOUNTER — Other Ambulatory Visit (HOSPITAL_COMMUNITY): Payer: Self-pay

## 2023-02-25 NOTE — Telephone Encounter (Signed)
PA was already approved. Pt should not need another PA completed until December   Pharmacy Patient Advocate Encounter   Received notification from [facility] that Prior Authorization for [facility] has been  APPROVED 07/30./2024 TO 12/31 /2024  PA #/Case ID/Reference #: [id number]

## 2023-03-01 NOTE — Telephone Encounter (Signed)
Patient f/u on PA for [facility]. Notes state that PA is approved from [facility] until 07/06/2023.  PA was already approved. Pt should not need another PA completed until December     Pharmacy Patient Advocate Encounter   Received notification from [facility] that Prior Authorization for [facility] has been  APPROVED 07/30./2024 TO 12/31 /2024  PA #/Case ID/Reference #: [id number]

## 2023-03-02 NOTE — Telephone Encounter (Signed)
Patient is aware that his PA is approved until December 2024, however he states that he spoke with his insurance company and they have informed him that a new PA is needed with every dosage increase. I told the patient I was unsure if that was the case since its the same medication. He mentions that if this is not approved he will just continue with the current dosage.

## 2023-03-03 ENCOUNTER — Ambulatory Visit (INDEPENDENT_AMBULATORY_CARE_PROVIDER_SITE_OTHER): Payer: Medicare HMO

## 2023-03-03 VITALS — Wt 239.0 lb

## 2023-03-03 DIAGNOSIS — Z0000 Encounter for general adult medical examination without abnormal findings: Secondary | ICD-10-CM | POA: Diagnosis not present

## 2023-03-03 NOTE — Patient Instructions (Signed)
[name] , Thank you for taking time to come for your Medicare Wellness Visit. I appreciate your ongoing commitment to your health goals. Please review the following plan we discussed and let me know if I can assist you in the future.   Referrals/Orders/Follow-Ups/Clinician Recommendations: stay healthy and active   This is a list of the screening recommended for you and due dates:  Health Maintenance  Topic Date Due   Flu Shot  02/04/2023   Medicare Annual Wellness Visit  03/02/2024   Colon Cancer Screening  10/22/2024   DTaP/Tdap/Td vaccine (2 - Td or Tdap) 07/03/2026   Pneumonia Vaccine  Completed   Hepatitis C Screening  Completed   Zoster (Shingles) Vaccine  Completed   HPV Vaccine  Aged Out   COVID-19 Vaccine  Discontinued    Advanced directives: (Declined) Advance directive discussed with you today. Even though you declined this today, please call our office should you change your mind, and we can give you the proper paperwork for you to fill out.  Next Medicare Annual Wellness Visit scheduled for next year: Yes

## 2023-03-03 NOTE — Progress Notes (Signed)
Subjective:   Paul Phillips is a 72 y.o. male who presents for Medicare Annual/Subsequent preventive examination.  Visit Complete: Virtual  I connected with  Paul Phillips on 03/03/23 by a audio enabled telemedicine application and verified that I am speaking with the correct person using two identifiers.  Patient Location: Home  Provider Location: Home Office  I discussed the limitations of evaluation and management by telemedicine. The patient expressed understanding and agreed to proceed.  Patient Medicare AWV questionnaire was completed by the patient on 03/02/23; I have confirmed that all information answered by patient is correct and no changes since this date.  Vital Signs: Unable to obtain new vitals due to this being a telehealth visit.   Review of Systems     Cardiac Risk Factors include: advanced age (>88men, >22 women);hypertension;obesity (BMI >30kg/m2);male gender;dyslipidemia     Objective:    Today's Vitals   03/03/23 1450  Weight: 239 lb (108.4 kg)   Body mass index is 39.77 kg/m.     03/03/2023    2:53 PM 03/11/2022   12:19 PM 02/23/2021    1:58 PM 01/15/2021    2:32 PM 01/08/2021    2:55 PM 07/25/2020    1:51 PM 09/17/2017   12:13 PM  Advanced Directives  Does Patient Have a Medical Advance Directive? No Yes No No No No No  Type of Advance Directive  Living will       Does patient want to make changes to medical advance directive?  No - Patient declined       Would patient like information on creating a medical advance directive? No - Patient declined   No - Patient declined   No - Patient declined    Current Medications (verified) Outpatient Encounter Medications as of 03/03/2023  Medication Sig   amLODipine (NORVASC) 2.5 MG tablet Take 2 tablets (5 mg total) by mouth daily.   atorvastatin (LIPITOR) 10 MG tablet Take 1 tablet (10 mg total) by mouth daily.   diclofenac (VOLTAREN) 75 MG EC tablet Take 1 tablet (75 mg total) by mouth 2  (two) times daily.   lisinopril (ZESTRIL) 40 MG tablet Take 1 tablet (40 mg total) by mouth daily.   Multiple Vitamins-Minerals (MENS 50+ MULTI VITAMIN/MIN) TABS Take 1 tablet by mouth daily.   vitamin B-12 (CYANOCOBALAMIN) 1000 MCG tablet Take 2,500 mcg by mouth daily.    WEGOVY 0.25 MG/0.5ML SOAJ INJECT 0.25 MG INTO THE SKIN ONCE A WEEK   WEGOVY 0.5 MG/0.5ML SOAJ Inject 0.5 mg into the skin once a week.   [START ON 03/08/2023] WEGOVY 1 MG/0.5ML SOAJ Inject 1 mg into the skin once a week.   No facility-administered encounter medications on file as of 03/03/2023.    Allergies (verified) Patient has no known allergies.   History: Past Medical History:  Diagnosis Date   Bell's palsy 12/31/2020   Dislocated thumb    GERD (gastroesophageal reflux disease)    Heart murmur    Hypertension    Osteoarthritis of right hip    Osteoarthritis, multiple sites    hands and hips   Osteonecrosis of right hip (HCC)    pt to get hip replacement by [facility] as of 03/2021   Sleep disturbance 06/22/2018   Past Surgical History:  Procedure Laterality Date   COLONOSCOPY  10/26/2016   TA   right total hip arthroplasty Right 04/11/2021   Family History  Problem Relation Age of Onset   Arthritis Mother  Diabetes Mother    Hearing loss Mother    Stroke Mother    Hearing loss Father    Heart disease Father    AAA (abdominal aortic aneurysm) Father    Aneurysm Sister 44       brain, died from aneurysm suddenly.    Heart attack Brother    Arthritis/Rheumatoid Maternal Grandmother    Brain cancer Maternal Grandfather    Colon cancer Neg Hx    Colon polyps Neg Hx    Esophageal cancer Neg Hx    Stomach cancer Neg Hx    Rectal cancer Neg Hx    Social History   Socioeconomic History   Marital status: Married    Spouse name: Kitty   Number of children: 1   Years of education: 14   Highest education level: Not on file  Occupational History   Occupation: Personnel officer  Tobacco Use    Smoking status: Former    Current packs/day: 0.00    Average packs/day: 1 pack/day for 30.0 years (30.0 ttl pk-yrs)    Types: Cigarettes    Start date: 07/06/1969    Quit date: 07/07/1999    Years since quitting: 23.6   Smokeless tobacco: Never  Vaping Use   Vaping status: Never Used  Substance and Sexual Activity   Alcohol use: Yes    Alcohol/week: 4.0 standard drinks of alcohol    Types: 4 Cans of beer per week   Drug use: No   Sexual activity: Yes    Partners: Female    Comment: married  Other Topics Concern   Not on file  Social History Narrative   Married to [place]. 1 adult child [place].   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    Former smoker, quit 2001   Drinks caffeine, takes a daily vitamin.   Wear seatbelt. Smoke detector in the home. Firearms in the home.   safe in his relationships.   Right handed    Social Determinants of Health   Financial Resource Strain: Low Risk  (03/02/2023)   Overall Financial Resource Strain (CARDIA)    Difficulty of Paying Living Expenses: Not hard at all  Food Insecurity: No Food Insecurity (03/02/2023)   Hunger Vital Sign    Worried About Running Out of Food in the Last Year: Never true    Ran Out of Food in the Last Year: Never true  Transportation Needs: No Transportation Needs (03/02/2023)   PRAPARE - Administrator, Civil Service (Medical): No    Lack of Transportation (Non-Medical): No  Physical Activity: Insufficiently Active (03/02/2023)   Exercise Vital Sign    Days of Exercise per Week: 4 days    Minutes of Exercise per Session: 20 min  Stress: No Stress Concern Present (03/02/2023)   [employer] of Occupational Health - Occupational Stress Questionnaire    Feeling of Stress : Only a little  Social Connections: Moderately Integrated (03/02/2023)   Social Connection and Isolation Panel [NHANES]    Frequency of Communication with Friends and Family: Once a week    Frequency of Social Gatherings with  Friends and Family: More than three times a week    Attends Religious Services: 1 to 4 times per year    Active Member of [employer] or Organizations: No    Attends Banker Meetings: Never    Marital Status: Married    Tobacco Counseling Counseling given: Not Answered   Clinical Intake:  Pre-visit preparation completed: Yes  Pain : No/denies  pain     BMI - recorded: 39.77 Nutritional Status: BMI > 30  Obese Nutritional Risks: None Diabetes: No  How often do you need to have someone help you when you read instructions, pamphlets, or other written materials from your doctor or pharmacy?: 1 - Never  Interpreter Needed?: No  Information entered by :: Lanier Ensign, LPN   Activities of Daily Living    03/02/2023    6:53 PM 12/04/2022    8:10 AM  In your present state of health, do you have any difficulty performing the following activities:  Hearing? 0 0  Vision? 0 0  Difficulty concentrating or making decisions? 1 0  Walking or climbing stairs? 0 0  Dressing or bathing? 0 0  Doing errands, shopping? 0 0  Preparing Food and eating ? N N  Using the Toilet? N N  In the past six months, have you accidently leaked urine? N N  Do you have problems with loss of bowel control? N N  Managing your Medications? N N  Managing your Finances? N N  Housekeeping or managing your Housekeeping? N N    Patient Care Team: Natalia Leatherwood, DO as PCP - General (Family Medicine) Reginia Naas, MD as Referring Physician (Dermatology) Elwyn Reach (Neurology) Drema Dallas, DO as Consulting Physician (Neurology)  Indicate any recent Medical Services you may have received from other than [facility] providers in the past year (date may be approximate).     Assessment:   This is a routine wellness examination for [place].  Hearing/Vision screen Hearing Screening - Comments:: Pt denies any hearing issues   Dietary issues and exercise activities discussed:     Goals  Addressed             This Visit's Progress    Patient Stated       Lose some more weight        Depression Screen    03/03/2023    2:53 PM 10/20/2022    2:48 PM 07/21/2021    2:33 PM 07/15/2020    2:32 PM 06/26/2019    2:56 PM 12/21/2018    2:49 PM 12/10/2017    2:57 PM  PHQ 2/9 Scores  PHQ - 2 Score 0 0 0 0 0 0 0  PHQ- 9 Score   2        Fall Risk    03/02/2023    6:53 PM 12/04/2022    8:10 AM 10/20/2022    2:48 PM 03/11/2022    6:41 AM 03/11/2022    6:40 AM  Fall Risk   Falls in the past year? 0 0 0 0 0  Number falls in past yr: 0  0  0  Injury with Fall? 0  0  0  Risk for fall due to : Impaired vision      Follow up Falls prevention discussed  Falls evaluation completed      MEDICARE RISK AT HOME: Medicare Risk at Home Any stairs in or around the home?: No If so, are there any without handrails?: No Home free of loose throw rugs in walkways, pet beds, electrical cords, etc?: Yes Adequate lighting in your home to reduce risk of falls?: Yes Life alert?: No Use of a cane, walker or w/c?: No Grab bars in the bathroom?: Yes Shower chair or bench in shower?: Yes Elevated toilet seat or a handicapped toilet?: No  TIMED UP AND GO:  Was the test performed?  No  Cognitive Function:        03/03/2023    2:55 PM 03/11/2022   12:24 PM  6CIT Screen  What Year? 0 points 0 points  What month? 0 points 0 points  What time? 0 points 0 points  Count back from 20 0 points 0 points  Months in reverse 0 points 0 points  Repeat phrase 0 points 0 points  Total Score 0 points 0 points    Immunizations Immunization History  Administered Date(s) Administered   Fluad Quad(high Dose 65+) 05/12/2022   Hepatitis B, PED/ADOLESCENT 09/22/2013   Influenza-Unspecified 04/05/2017, 04/26/2018, 03/07/2019, 04/05/2020, 05/06/2021   PFIZER(Purple Top)SARS-COV-2 Vaccination 09/03/2019, 10/03/2019, 06/24/2020   Pneumococcal Conjugate-13 07/03/2016   Pneumococcal Polysaccharide-23  06/22/2018   Tdap 07/03/2016   Zoster Recombinant(Shingrix) 07/15/2020, 11/08/2020    TDAP status: Up to date  Flu Vaccine status: Due, Education has been provided regarding the importance of this vaccine. Advised may receive this vaccine at local pharmacy or Health Dept. Aware to provide a copy of the vaccination record if obtained from local pharmacy or Health Dept. Verbalized acceptance and understanding.  Pneumococcal vaccine status: Up to date  Covid-19 vaccine status: Information provided on how to obtain vaccines.   Qualifies for Shingles Vaccine? Yes   Zostavax completed Yes   Shingrix Completed?: Yes  Screening Tests Health Maintenance  Topic Date Due   INFLUENZA VACCINE  02/04/2023   Medicare Annual Wellness (AWV)  03/02/2024   Colonoscopy  10/22/2024   DTaP/Tdap/Td (2 - Td or Tdap) 07/03/2026   Pneumonia Vaccine 34+ Years old  Completed   Hepatitis C Screening  Completed   Zoster Vaccines- Shingrix  Completed   HPV VACCINES  Aged Out   COVID-19 Vaccine  Discontinued    Health Maintenance  Health Maintenance Due  Topic Date Due   INFLUENZA VACCINE  02/04/2023    Colorectal cancer screening: Type of screening: Colonoscopy. Completed 10/23/19. Repeat every 5 years  Additional Screening:  Hepatitis C Screening:  Completed 07/01/16  Vision Screening: Recommended annual ophthalmology exams for early detection of glaucoma and other disorders of the eye. Is the patient up to date with their annual eye exam?  Yes  Who is the provider or what is the name of the office in which the patient attends annual eye exams?  If pt is not established with a provider, would they like to be referred to a provider to establish care? No .   Dental Screening: Recommended annual dental exams for proper oral hygiene    Community Resource Referral / Chronic Care Management: CRR required this visit?  No   CCM required this visit?  No     Plan:     I have personally reviewed  and noted the following in the patient's chart:   Medical and social history Use of alcohol, tobacco or illicit drugs  Current medications and supplements including opioid prescriptions. Patient is not currently taking opioid prescriptions. Functional ability and status Nutritional status Physical activity Advanced directives List of other physicians Hospitalizations, surgeries, and ER visits in previous 12 months Vitals Screenings to include cognitive, depression, and falls Referrals and appointments  In addition, I have reviewed and discussed with patient certain preventive protocols, quality metrics, and best practice recommendations. A written personalized care plan for preventive services as well as general preventive health recommendations were provided to patient.     Marzella Schlein, LPN   10/12/8117   After Visit Summary: (MyChart) Due to this being a telephonic visit,  the after visit summary with patients personalized plan was offered to patient via MyChart   Nurse Notes: none

## 2023-03-05 ENCOUNTER — Other Ambulatory Visit (HOSPITAL_COMMUNITY): Payer: Self-pay

## 2023-03-05 NOTE — Telephone Encounter (Signed)
Additional PA is not required as long as patient is titrating up in dose every month. Ran test claim, 0.5MG  was filled through insurance on 03/02/23. Also ran test claim for 1MG , received paid claim.

## 2023-03-16 ENCOUNTER — Ambulatory Visit (INDEPENDENT_AMBULATORY_CARE_PROVIDER_SITE_OTHER): Payer: Medicare HMO | Admitting: Family Medicine

## 2023-03-16 ENCOUNTER — Encounter: Payer: Self-pay | Admitting: Family Medicine

## 2023-03-16 VITALS — BP 121/54 | HR 69 | Temp 97.9°F | Wt 221.2 lb

## 2023-03-16 DIAGNOSIS — I10 Essential (primary) hypertension: Secondary | ICD-10-CM

## 2023-03-16 DIAGNOSIS — I77810 Thoracic aortic ectasia: Secondary | ICD-10-CM

## 2023-03-16 DIAGNOSIS — E781 Pure hyperglyceridemia: Secondary | ICD-10-CM | POA: Diagnosis not present

## 2023-03-16 DIAGNOSIS — Z87891 Personal history of nicotine dependence: Secondary | ICD-10-CM | POA: Diagnosis not present

## 2023-03-16 DIAGNOSIS — M1990 Unspecified osteoarthritis, unspecified site: Secondary | ICD-10-CM | POA: Diagnosis not present

## 2023-03-16 DIAGNOSIS — I2510 Atherosclerotic heart disease of native coronary artery without angina pectoris: Secondary | ICD-10-CM | POA: Diagnosis not present

## 2023-03-16 DIAGNOSIS — Z23 Encounter for immunization: Secondary | ICD-10-CM

## 2023-03-16 DIAGNOSIS — Z8249 Family history of ischemic heart disease and other diseases of the circulatory system: Secondary | ICD-10-CM

## 2023-03-16 DIAGNOSIS — R931 Abnormal findings on diagnostic imaging of heart and coronary circulation: Secondary | ICD-10-CM

## 2023-03-16 MED ORDER — WEGOVY 1.7 MG/0.75ML ~~LOC~~ SOAJ: 1.7000 mg | SUBCUTANEOUS | 0 refills | Status: DC

## 2023-03-16 NOTE — Progress Notes (Signed)
Paul, Phillips 19-Jan-1951, 72 y.o., male MRN: [id number] Patient Care Team    Relationship Specialty Notifications Start End  Natalia Leatherwood, DO PCP - General Family Medicine  06/19/16   Reginia Naas, MD Referring Physician Dermatology  12/21/18   Marcos Eke, PA-C  Neurology  01/08/21   Drema Dallas, DO Consulting Physician Neurology  01/08/21     Chief Complaint  Patient presents with   Hypertension    Has 2 more of 0.5 mg     Subjective: Paul Phillips is a 72 y.o. Pt presents for an OV for management of chronic conditions.   HTN/HLD/Morbid obesity/thrombocytopenia: Pt reports compliance with  with  lisnopril 40 mg QD and added amlodipine 2.5 mg.  He states he did have some lower end blood pressures, but mostly has maintained low 110s-120s systolics.  He has lost 19 pounds on Wegovy, since adding the amlodipine.  Patient denies chest pain, shortness of breath, dizziness or lower extremity edema.  Pt takes a daily baby ASA. Pt is  prescribed statin Diet: Started watching his diet very closely. Exercise: Is exercising routinely.   RF: Hypertension, hypertriglyceridemia, former smoker, strong family history of heart disease, stroke and aneurysms Coronary Calcium Score:259 Percentile: 63rd percentile  Ascending Aorta: Mildly dilated measuring 42mm at the bifurcation of the main pulmonary artery.  Patient is tolerating Wegovy taper and is currently on his second dose of Wegovy 0.5 mg weekly injection.  He is having some constipation but is taking MiraLAX which is helping.  Arthritis:  Patient compliant with diclofenac twice daily.  Patient feels medication works well.    Narrative & Impression  01/27/2023 EXAM: ULTRASOUND OF ABDOMINAL AORTA FINDINGS: Abdominal aortic measurements as follows: Proximal:  2.3 x 2.5 cm Mid:  2.1 x 2.4 cm Distal:  1.9 x 2.4 cm Patent: Yes, peak systolic velocity is 45 cm/s Right common iliac artery: 1.3 cm Left  common iliac artery: 1.3 cm IMPRESSION: No evidence of abdominal aortic aneurysm.  03/21/2018: ULTRASOUND OF ABDOMINAL AORTA FINDINGS: Abdominal aortic measurements as follows: Proximal:  2.6 x 2.5 cm Mid:  2.3 x 2.3 cm Distal:  2.1 x 2.5 cm IMPRESSION: Ectatic abdominal aorta at risk for aneurysm development. Recommend followup by ultrasound in 5 years.          03/03/2023    2:53 PM 10/20/2022    2:48 PM 07/21/2021    2:33 PM 07/15/2020    2:32 PM 06/26/2019    2:56 PM  Depression screen PHQ 2/9  Decreased Interest 0 0 0 0 0  Down, Depressed, Hopeless 0 0 0 0 0  PHQ - 2 Score 0 0 0 0 0  Altered sleeping   2    Tired, decreased energy   0    Change in appetite   0    Feeling bad or failure about yourself    0    Trouble concentrating   0    Moving slowly or fidgety/restless   0    Suicidal thoughts   0    PHQ-9 Score   2      No Known Allergies Social History   Social History Narrative   Married to [place]. 1 adult child [place].   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    Former smoker, quit 2001   Drinks caffeine, takes a daily vitamin.   Wear seatbelt. Smoke detector in the home. Firearms in the home.  safe in his relationships.   Right handed    Past Medical History:  Diagnosis Date   Bell's palsy 12/31/2020   Dislocated thumb    GERD (gastroesophageal reflux disease)    Heart murmur    Hypertension    Osteoarthritis of right hip    Osteoarthritis, multiple sites    hands and hips   Osteonecrosis of right hip (HCC)    pt to get hip replacement by [facility] as of 03/2021   Sleep disturbance 06/22/2018   Past Surgical History:  Procedure Laterality Date   COLONOSCOPY  10/26/2016   TA   right total hip arthroplasty Right 04/11/2021   Family History  Problem Relation Age of Onset   Arthritis Mother    Diabetes Mother    Hearing loss Mother    Stroke Mother    Hearing loss Father    Heart disease Father    AAA (abdominal aortic aneurysm)  Father    Aneurysm Sister 56       brain, died from aneurysm suddenly.    Heart attack Brother    Arthritis/Rheumatoid Maternal Grandmother    Brain cancer Maternal Grandfather    Colon cancer Neg Hx    Colon polyps Neg Hx    Esophageal cancer Neg Hx    Stomach cancer Neg Hx    Rectal cancer Neg Hx    Allergies as of 03/16/2023   No Known Allergies      Medication List        Accurate as of March 16, 2023  4:08 PM. If you have any questions, ask your nurse or doctor.          STOP taking these medications    Wegovy 0.5 MG/0.5ML Soaj Generic drug: Semaglutide-Weight Management Replaced by: Wegovy 1.7 MG/0.75ML Soaj You also have another medication with the same name that you need to continue taking as instructed. Stopped by: Felix Pacini       TAKE these medications    amLODipine 2.5 MG tablet Commonly known as: NORVASC Take 2 tablets (5 mg total) by mouth daily.   atorvastatin 10 MG tablet Commonly known as: LIPITOR Take 1 tablet (10 mg total) by mouth daily.   cyanocobalamin 1000 MCG tablet Commonly known as: VITAMIN B12 Take 2,500 mcg by mouth daily.   diclofenac 75 MG EC tablet Commonly known as: VOLTAREN Take 1 tablet (75 mg total) by mouth 2 (two) times daily.   lisinopril 40 MG tablet Commonly known as: ZESTRIL Take 1 tablet (40 mg total) by mouth daily.   Mens 50+ Multi Vitamin/Min Tabs Take 1 tablet by mouth daily.   Wegovy 1 MG/0.5ML Soaj Generic drug: Semaglutide-Weight Management Inject 1 mg into the skin once a week. What changed:  Another medication with the same name was added. Make sure you understand how and when to take each. Another medication with the same name was removed. Continue taking this medication, and follow the directions you see here. Changed by: Felix Pacini   Wegovy 1.7 MG/0.75ML Soaj Generic drug: Semaglutide-Weight Management Inject 1.7 mg into the skin once a week. Start taking on: April 15, 2023 What  changed: You were already taking a medication with the same name, and this prescription was added. Make sure you understand how and when to take each. Replaces: Wegovy 0.5 MG/0.5ML Soaj Changed by: Felix Pacini        All past medical history, surgical history, allergies, family history, immunizations andmedications were updated in the EMR today and reviewed  under the history and medication portions of their EMR.     ROS Negative, with the exception of above mentioned in HPI   Objective:  BP (!) 121/54   Pulse 69   Temp 97.9 F (36.6 C)   Wt 221 lb 3.2 oz (100.3 kg)   SpO2 97%   BMI 36.81 kg/m  Body mass index is 36.81 kg/m. Physical Exam Vitals and nursing note reviewed.  Constitutional:      General: He is not in acute distress.    Appearance: Normal appearance. He is not ill-appearing, toxic-appearing or diaphoretic.  HENT:     Head: Normocephalic and atraumatic.  Eyes:     General: No scleral icterus.       Right eye: No discharge.        Left eye: No discharge.     Extraocular Movements: Extraocular movements intact.     Pupils: Pupils are equal, round, and reactive to light.  Cardiovascular:     Rate and Rhythm: Normal rate and regular rhythm.  Pulmonary:     Effort: Pulmonary effort is normal. No respiratory distress.     Breath sounds: Normal breath sounds. No wheezing, rhonchi or rales.  Musculoskeletal:     Right lower leg: No edema.     Left lower leg: No edema.  Skin:    General: Skin is warm.     Findings: No rash.  Neurological:     Mental Status: He is alert and oriented to person, place, and time. Mental status is at baseline.  Psychiatric:        Mood and Affect: Mood normal.        Behavior: Behavior normal.        Thought Content: Thought content normal.        Judgment: Judgment normal.      No results found. No results found. No results found for this or any previous visit (from the past 24 hour(s)).  Assessment/Plan: Paul Phillips is a 72 y.o. male present for OV for  Coronary artery disease involving native coronary artery of native heart without angina pectoris/Morbid obesity with BMI of 40.0-44.9, adult (HCC)/Agatston coronary artery calcium score between 200 and 399 (259, 63rd%) -Continue atorvastatin at 10 mg daily may consider increasing after next visit - Continue Wegovy taper 0.5 mg, 1 mg> called in 1.7 mg dose today. -He is seeing improvement in his exercise tolerance already.  He has lost 19 pounds and is feeling more energetic.  He is focusing on a diet of lean meats, fresh fruits and vegetables.  Avoiding all other forms of carbohydrates and sugars.   -Associated constipation advised him to continue MiraLAX and he could consider adding Senokot nightly Follow-up in 8 weeks with provider  Hypertension/morbid obesity/Statin declinedFamily history of abdominal aortic aneurysm (AAA)/hypertriglycerides:  Blood pressure has greatly improved with loss of weight. Continue Lisinopril 40 mg  Stop  amlodipine 2.5 mg  (he was increased to 5 mg and he had low pressures so he decreased back to 2.5 mg)> BP low normal since he lost 19 lbs!!!!!! Low sodium diet.  Routine exercise.  - AAA screen q 5 yrs-completed 01/23/2023  Arthritis:  Stable Continue Voltaren. Plts have been stable on medication    Reviewed expectations re: course of current medical issues. Discussed self-management of symptoms. Outlined signs and symptoms indicating need for more acute intervention. Patient verbalized understanding and all questions were answered. Patient received an After-Visit Summary.    No orders of  the defined types were placed in this encounter.  Meds ordered this encounter  Medications   WEGOVY 1.7 MG/0.75ML SOAJ    Sig: Inject 1.7 mg into the skin once a week.    Dispense:  3 mL    Refill:  0    May fill  after 1 mg dose completed   Referral Orders  No referral(s) requested today     Note is  dictated utilizing voice recognition software. Although note has been proof read prior to signing, occasional typographical errors still can be missed. If any questions arise, please do not hesitate to call for verification.   electronically signed by:  Felix Pacini, DO  [facility] [place]

## 2023-03-16 NOTE — Telephone Encounter (Signed)
Pt states that [employer] is needing PA. [employer] is covering but [employer]BS is primary insurance and needs PA completed for them as well. Please advise

## 2023-03-16 NOTE — Patient Instructions (Addendum)
Return in about 8 weeks (around 05/11/2023) for Routine chronic condition follow-up.  Senakot 1-2 tabs before bed can also be helpful with constipation.  Hydrate!    Vitals - 1 value per visit     Weight (lb) Height BMI  01/29/2023 239.4   39.84 kg/m2   03/03/2023 239   39.77 kg/m2   03/16/2023 221.2   36.81 kg/m2         Great to see you today.  I have refilled the medication(s) we provide.   If labs were collected or images ordered, we will inform you of  results once we have received them and reviewed. We will contact you either by echart message, or telephone call.  Please give ample time to the testing facility, and our office to run,  receive and review results. Please do not call inquiring of results, even if you can see them in your chart. We will contact you as soon as we are able. If it has been over 1 week since the test was completed, and you have not yet heard from [place], then please call us.    - echart message- for normal results that have been seen by the patient already.   - telephone call: abnormal results or if patient has not viewed results in their echart.  If a referral to a specialist was entered for you, please call us in 2 weeks if you have not heard from the specialist office to schedule.

## 2023-03-18 ENCOUNTER — Other Ambulatory Visit (HOSPITAL_COMMUNITY): Payer: Self-pay

## 2023-03-24 ENCOUNTER — Telehealth: Payer: Self-pay

## 2023-03-24 ENCOUNTER — Other Ambulatory Visit (HOSPITAL_COMMUNITY): Payer: Self-pay

## 2023-03-24 NOTE — Telephone Encounter (Signed)
Pharmacy Patient Advocate Encounter   Received notification from Patient Advice Request messages that prior authorization for Wegovy 1.7MG /0.75ML auto-injectors is required/requested.   Insurance verification completed.   The patient is insured through [facility] .   Per test claim: PA required; PA submitted to [employer] via CoverMyMeds Key/confirmation #/EOC [id number] Status is pending

## 2023-03-24 NOTE — Telephone Encounter (Signed)
No further action needed at this time.

## 2023-03-30 NOTE — Telephone Encounter (Signed)
Pharmacy Patient Advocate Encounter  Received notification from [facility] that Prior Authorization for [facility] 1.7MG /0.75ML auto-injectors has been APPROVED from 03/24/23 to 03/23/24   PA #/Case ID/Reference #: [id number]

## 2023-04-01 DIAGNOSIS — H52229 Regular astigmatism, unspecified eye: Secondary | ICD-10-CM | POA: Diagnosis not present

## 2023-04-01 DIAGNOSIS — H5213 Myopia, bilateral: Secondary | ICD-10-CM | POA: Diagnosis not present

## 2023-05-11 ENCOUNTER — Encounter: Payer: Self-pay | Admitting: Family Medicine

## 2023-05-11 ENCOUNTER — Ambulatory Visit (INDEPENDENT_AMBULATORY_CARE_PROVIDER_SITE_OTHER): Payer: BC Managed Care – PPO | Admitting: Family Medicine

## 2023-05-11 VITALS — BP 138/58 | HR 74 | Temp 98.0°F | Ht 65.0 in | Wt 210.6 lb

## 2023-05-11 DIAGNOSIS — I77810 Thoracic aortic ectasia: Secondary | ICD-10-CM | POA: Diagnosis not present

## 2023-05-11 DIAGNOSIS — Z6841 Body Mass Index (BMI) 40.0 and over, adult: Secondary | ICD-10-CM | POA: Diagnosis not present

## 2023-05-11 DIAGNOSIS — Z8249 Family history of ischemic heart disease and other diseases of the circulatory system: Secondary | ICD-10-CM | POA: Diagnosis not present

## 2023-05-11 DIAGNOSIS — I10 Essential (primary) hypertension: Secondary | ICD-10-CM | POA: Diagnosis not present

## 2023-05-11 DIAGNOSIS — M1990 Unspecified osteoarthritis, unspecified site: Secondary | ICD-10-CM | POA: Diagnosis not present

## 2023-05-11 DIAGNOSIS — I2510 Atherosclerotic heart disease of native coronary artery without angina pectoris: Secondary | ICD-10-CM

## 2023-05-11 DIAGNOSIS — E781 Pure hyperglyceridemia: Secondary | ICD-10-CM

## 2023-05-11 DIAGNOSIS — R931 Abnormal findings on diagnostic imaging of heart and coronary circulation: Secondary | ICD-10-CM | POA: Diagnosis not present

## 2023-05-11 MED ORDER — WEGOVY 2.4 MG/0.75ML ~~LOC~~ SOAJ: 2.4000 mg | SUBCUTANEOUS | 5 refills | Status: DC

## 2023-05-11 NOTE — Progress Notes (Signed)
[name], [name] 1950-07-16, 72 y.o., male MRN: [id number] Patient Care Team    Relationship Specialty Notifications Start End  Paul Leatherwood, DO PCP - General Family Medicine  06/19/16   Paul Naas, MD Referring Physician Dermatology  12/21/18   Paul Eke, PA-C  Neurology  01/08/21   Paul Dallas, DO Consulting Physician Neurology  01/08/21     Chief Complaint  Patient presents with   Obesity     Subjective: Paul Phillips is a 72 y.o. Pt presents for an OV for management of chronic conditions.   HTN/HLD/Morbid obesity/thrombocytopenia: Pt reports compliance with  with  lisnopril 40 mg QD .  He has lost 29 pounds on Wegovy, since adding the amlodipine.   Patient denies chest pain, shortness of breath, dizziness or lower extremity edema.  Pt takes a daily baby ASA. Pt is  prescribed statin Diet: Started watching his diet very closely. Exercise: Is exercising routinely.   RF: Hypertension, hypertriglyceridemia, former smoker, strong family history of heart disease, stroke and aneurysms Coronary Calcium Score:259 Percentile: 63rd percentile  Ascending Aorta: Mildly dilated measuring 42mm at the bifurcation of the main pulmonary artery.  Patient is tolerating Wegovy taper and is currently on his second dose of Wegovy 0.5 mg weekly injection.  He is having some constipation but is taking MiraLAX which is helping.  Arthritis:  Patient compliant with diclofenac twice daily.  Patient feels medication works well.  Narrative & Impression  01/27/2023 EXAM: ULTRASOUND OF ABDOMINAL AORTA FINDINGS: Abdominal aortic measurements as follows: Proximal:  2.3 x 2.5 cm Mid:  2.1 x 2.4 cm Distal:  1.9 x 2.4 cm Patent: Yes, peak systolic velocity is 45 cm/s Right common iliac artery: 1.3 cm Left common iliac artery: 1.3 cm IMPRESSION: No evidence of abdominal aortic aneurysm.  03/21/2018: ULTRASOUND OF ABDOMINAL AORTA FINDINGS: Abdominal aortic  measurements as follows: Proximal:  2.6 x 2.5 cm Mid:  2.3 x 2.3 cm Distal:  2.1 x 2.5 cm IMPRESSION: Ectatic abdominal aorta at risk for aneurysm development. Recommend followup by ultrasound in 5 years.          03/03/2023    2:53 PM 10/20/2022    2:48 PM 07/21/2021    2:33 PM 07/15/2020    2:32 PM 06/26/2019    2:56 PM  Depression screen PHQ 2/9  Decreased Interest 0 0 0 0 0  Down, Depressed, Hopeless 0 0 0 0 0  PHQ - 2 Score 0 0 0 0 0  Altered sleeping   2    Tired, decreased energy   0    Change in appetite   0    Feeling bad or failure about yourself    0    Trouble concentrating   0    Moving slowly or fidgety/restless   0    Suicidal thoughts   0    PHQ-9 Score   2      No Known Allergies Social History   Social History Narrative   Married to Paul Phillips. 1 adult child [place].   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    Former smoker, quit 2001   Drinks caffeine, takes a daily vitamin.   Wear seatbelt. Smoke detector in the home. Firearms in the home.   safe in his relationships.   Right handed    Past Medical History:  Diagnosis Date   Bell's palsy 12/31/2020   Dislocated thumb    GERD (gastroesophageal reflux  disease)    Heart murmur    Hypertension    Osteoarthritis of right hip    Osteoarthritis, multiple sites    hands and hips   Osteonecrosis of right hip (HCC)    pt to get hip replacement by [facility] as of 03/2021   Sleep disturbance 06/22/2018   Past Surgical History:  Procedure Laterality Date   COLONOSCOPY  10/26/2016   TA   right total hip arthroplasty Right 04/11/2021   Family History  Problem Relation Age of Onset   Arthritis Mother    Diabetes Mother    Hearing loss Mother    Stroke Mother    Hearing loss Father    Heart disease Father    AAA (abdominal aortic aneurysm) Father    Aneurysm Sister 56       brain, died from aneurysm suddenly.    Heart attack Brother    Arthritis/Rheumatoid Maternal Grandmother    Brain  cancer Maternal Grandfather    Colon cancer Neg Hx    Colon polyps Neg Hx    Esophageal cancer Neg Hx    Stomach cancer Neg Hx    Rectal cancer Neg Hx    Allergies as of 05/11/2023   No Known Allergies      Medication List        Accurate as of May 11, 2023  2:17 PM. If you have any questions, ask your nurse or doctor.          STOP taking these medications    Wegovy 1 MG/0.5ML Soaj Generic drug: Semaglutide-Weight Management Replaced by: Wegovy 2.4 MG/0.75ML Soaj You also have another medication with the same name that you need to continue taking as instructed. Stopped by: Paul Phillips       TAKE these medications    atorvastatin 10 MG tablet Commonly known as: LIPITOR Take 1 tablet (10 mg total) by mouth daily.   cyanocobalamin 1000 MCG tablet Commonly known as: VITAMIN B12 Take 2,500 mcg by mouth daily.   diclofenac 75 MG EC tablet Commonly known as: VOLTAREN Take 1 tablet (75 mg total) by mouth 2 (two) times daily.   lisinopril 40 MG tablet Commonly known as: ZESTRIL Take 1 tablet (40 mg total) by mouth daily.   Mens 50+ Multi Vitamin/Min Tabs Take 1 tablet by mouth daily.   Wegovy 1.7 MG/0.75ML Soaj Generic drug: Semaglutide-Weight Management Inject 1.7 mg into the skin once a week. What changed:  Another medication with the same name was added. Make sure you understand how and when to take each. Another medication with the same name was removed. Continue taking this medication, and follow the directions you see here. Changed by: Paul Phillips   Wegovy 2.4 MG/0.75ML Soaj Generic drug: Semaglutide-Weight Management Inject 2.4 mg into the skin once a week. What changed: You were already taking a medication with the same name, and this prescription was added. Make sure you understand how and when to take each. Replaces: Wegovy 1 MG/0.5ML Soaj Changed by: Paul Phillips        All past medical history, surgical history, allergies, family  history, immunizations andmedications were updated in the EMR today and reviewed under the history and medication portions of their EMR.     ROS Negative, with the exception of above mentioned in HPI   Objective:  BP (!) 138/58   Pulse 74   Temp 98 F (36.7 C)   Ht 5\' 5"  (1.651 m)   Wt 210 lb 9.6 oz (95.5 kg)  SpO2 99%   BMI 35.05 kg/m  Body mass index is 35.05 kg/m. Physical Exam Vitals and nursing note reviewed.  Constitutional:      General: He is not in acute distress.    Appearance: Normal appearance. He is not ill-appearing, toxic-appearing or diaphoretic.  HENT:     Head: Normocephalic and atraumatic.  Eyes:     General: No scleral icterus.       Right eye: No discharge.        Left eye: No discharge.     Extraocular Movements: Extraocular movements intact.     Pupils: Pupils are equal, round, and reactive to light.  Cardiovascular:     Rate and Rhythm: Normal rate and regular rhythm.  Pulmonary:     Effort: Pulmonary effort is normal. No respiratory distress.     Breath sounds: Normal breath sounds. No wheezing, rhonchi or rales.  Musculoskeletal:     Right lower leg: No edema.     Left lower leg: No edema.  Skin:    General: Skin is warm.     Findings: No rash.  Neurological:     Mental Status: He is alert and oriented to person, place, and time. Mental status is at baseline.  Psychiatric:        Mood and Affect: Mood normal.        Behavior: Behavior normal.        Thought Content: Thought content normal.        Judgment: Judgment normal.      No results found. No results found. No results found for this or any previous visit (from the past 24 hour(s)).  Assessment/Plan: Goran Olden is a 73 y.o. male present for OV for  Coronary artery disease involving native coronary artery of native heart without angina pectoris/Morbid obesity with BMI of 40.0-44.9, adult (HCC)/Agatston coronary artery calcium score between 200 and 399 (259,  63rd%) - continue atorvastatin at 10 mg daily may consider increasing after next visit - continue Wegovy 1.7 mg > 2.4 mg weekly dose today. -He is seeing improvement in his exercise tolerance already.  He has lost 29 pounds and is feeling more energetic.  He is focusing on a diet of lean meats, fresh fruits and vegetables.  Avoiding all other forms of carbohydrates and sugars.   -Associated constipation advised him to continue MiraLAX and he could consider adding Senokot nightly  Hypertension/morbid obesity/Statin declinedFamily history of abdominal aortic aneurysm (AAA)/hypertriglycerides:  BP borderline today. If does not decrease nxt visit may need to add back amlodipine 2.5 mg qd Continue  Lisinopril 40 mg  Low sodium diet.  Routine exercise.  - AAA screen q 5 yrs-completed 01/23/2023  Arthritis:  Stable Continue Voltaren. Plts have been stable on medication    Reviewed expectations re: course of current medical issues. Discussed self-management of symptoms. Outlined signs and symptoms indicating need for more acute intervention. Patient verbalized understanding and all questions were answered. Patient received an After-Visit Summary.    No orders of the defined types were placed in this encounter.  Meds ordered this encounter  Medications   WEGOVY 2.4 MG/0.75ML SOAJ    Sig: Inject 2.4 mg into the skin once a week.    Dispense:  3 mL    Refill:  5   Referral Orders  No referral(s) requested today     Note is dictated utilizing voice recognition software. Although note has been proof read prior to signing, occasional typographical errors still can be  missed. If any questions arise, please do not hesitate to call for verification.   electronically signed by:  Paul Pacini, DO  [facility] [place]

## 2023-05-11 NOTE — Patient Instructions (Addendum)
         Respirations Weight (lb) Height BMI  01/29/2023  239.4   39.84 kg/m2   03/03/2023  239   39.77 kg/m2   03/16/2023  221.2   36.81 kg/m2   05/11/2023  210.6  5\' 5"  (1.651 m)  35.05 kg/m2      See you in January.      Great to see you today.  I have refilled the medication(s) we provide.   If labs were collected or images ordered, we will inform you of  results once we have received them and reviewed. We will contact you either by echart message, or telephone call.  Please give ample time to the testing facility, and our office to run,  receive and review results. Please do not call inquiring of results, even if you can see them in your chart. We will contact you as soon as we are able. If it has been over 1 week since the test was completed, and you have not yet heard from [place], then please call us.    - echart message- for normal results that have been seen by the patient already.   - telephone call: abnormal results or if patient has not viewed results in their echart.  If a referral to a specialist was entered for you, please call us in 2 weeks if you have not heard from the specialist office to schedule.

## 2023-07-14 ENCOUNTER — Encounter: Payer: Self-pay | Admitting: Family Medicine

## 2023-07-14 ENCOUNTER — Ambulatory Visit (INDEPENDENT_AMBULATORY_CARE_PROVIDER_SITE_OTHER): Payer: Medicare (Managed Care) | Admitting: Family Medicine

## 2023-07-14 VITALS — BP 116/48 | HR 67 | Temp 98.3°F | Wt 203.4 lb

## 2023-07-14 DIAGNOSIS — Z6841 Body Mass Index (BMI) 40.0 and over, adult: Secondary | ICD-10-CM

## 2023-07-14 DIAGNOSIS — I2510 Atherosclerotic heart disease of native coronary artery without angina pectoris: Secondary | ICD-10-CM

## 2023-07-14 DIAGNOSIS — I10 Essential (primary) hypertension: Secondary | ICD-10-CM

## 2023-07-14 DIAGNOSIS — Z8249 Family history of ischemic heart disease and other diseases of the circulatory system: Secondary | ICD-10-CM

## 2023-07-14 DIAGNOSIS — R931 Abnormal findings on diagnostic imaging of heart and coronary circulation: Secondary | ICD-10-CM

## 2023-07-14 DIAGNOSIS — M1990 Unspecified osteoarthritis, unspecified site: Secondary | ICD-10-CM

## 2023-07-14 DIAGNOSIS — E781 Pure hyperglyceridemia: Secondary | ICD-10-CM

## 2023-07-14 DIAGNOSIS — M5134 Other intervertebral disc degeneration, thoracic region: Secondary | ICD-10-CM

## 2023-07-14 DIAGNOSIS — I77810 Thoracic aortic ectasia: Secondary | ICD-10-CM

## 2023-07-14 LAB — COMPREHENSIVE METABOLIC PANEL
ALT: 15 U/L (ref 0–53)
AST: 14 U/L (ref 0–37)
Albumin: 4.1 g/dL (ref 3.5–5.2)
Alkaline Phosphatase: 76 U/L (ref 39–117)
BUN: 30 mg/dL — ABNORMAL HIGH (ref 6–23)
CO2: 25 meq/L (ref 19–32)
Calcium: 9.7 mg/dL (ref 8.4–10.5)
Chloride: 108 meq/L (ref 96–112)
Creatinine, Ser: 1.09 mg/dL (ref 0.40–1.50)
GFR: 68.83 mL/min (ref >60.00)
Glucose, Bld: 70 mg/dL (ref 70–99)
Potassium: 4.3 meq/L (ref 3.5–5.1)
Sodium: 139 meq/L (ref 135–145)
Total Bilirubin: 0.6 mg/dL (ref 0.2–1.2)
Total Protein: 6.3 g/dL (ref 6.0–8.3)

## 2023-07-14 LAB — TSH: TSH: 0.56 u[IU]/mL (ref 0.35–5.50)

## 2023-07-14 LAB — LDL CHOLESTEROL, DIRECT: Direct LDL: 82 mg/dL

## 2023-07-14 MED ORDER — DICLOFENAC SODIUM 75 MG PO TBEC: 75.0000 mg | DELAYED_RELEASE_TABLET | Freq: Two times a day (BID) | ORAL | 1 refills | Status: DC

## 2023-07-14 MED ORDER — CETIRIZINE HCL 10 MG PO TABS: 10.0000 mg | ORAL_TABLET | Freq: Every day | ORAL | 3 refills | Status: DC

## 2023-07-14 MED ORDER — ATORVASTATIN CALCIUM 10 MG PO TABS: 10.0000 mg | ORAL_TABLET | Freq: Every day | ORAL | 1 refills | Status: DC

## 2023-07-14 MED ORDER — WEGOVY 2.4 MG/0.75ML ~~LOC~~ SOAJ: 2.4000 mg | SUBCUTANEOUS | 5 refills | Status: DC

## 2023-07-14 MED ORDER — LISINOPRIL 30 MG PO TABS: 30.0000 mg | ORAL_TABLET | Freq: Every day | ORAL | 1 refills | Status: DC

## 2023-07-14 NOTE — Progress Notes (Signed)
 Paul Phillips, Paul Phillips 01/04/1951, 73 y.o., male MRN: [id number] Patient Care Team    Relationship Specialty Notifications Start End  Catherine Paul LABOR, DO PCP - General Family Medicine  06/19/16   Claudene Loving, MD Referring Physician Dermatology  12/21/18   Dina Camie BRAVO, PA-C  Neurology  01/08/21   Skeet Juliene JONELLE, DO Consulting Physician Neurology  01/08/21     Chief Complaint  Patient presents with   Hypertension     Subjective: Paul Phillips is a 73 y.o. Pt presents for an OV for management of chronic conditions.   HTN/HLD/Morbid obesity/thrombocytopenia: Pt reports compliance with lisinopril  40 mg QD .  He has lost additional 18 pounds on Wegovy ,. Patient is tolerating Wegovy  2.4 weekly.  He is having some constipation but is taking MiraLAX which is helping. Patient denies chest pain, shortness of breath, dizziness or lower extremity edema.  Pt takes a daily baby ASA. Pt is  prescribed statin Diet: Started watching his diet very closely. Exercise: Is exercising routinely.   RF: Hypertension, hypertriglyceridemia, former smoker, strong family history of heart disease, stroke and aneurysms  Coronary Calcium  Score:259 Percentile: 63rd percentile  Ascending Aorta: Mildly dilated measuring 42mm at the bifurcation of the main pulmonary artery.    Arthritis:  Patient complaining with diclofenac  twice daily.  Patient feels medication works well.  Narrative & Impression  01/27/2023 EXAM: ULTRASOUND OF ABDOMINAL AORTA FINDINGS: Abdominal aortic measurements as follows: Proximal:  2.3 x 2.5 cm Mid:  2.1 x 2.4 cm Distal:  1.9 x 2.4 cm Patent: Yes, peak systolic velocity is 45 cm/s Right common iliac artery: 1.3 cm Left common iliac artery: 1.3 cm IMPRESSION: No evidence of abdominal aortic aneurysm.  03/21/2018: ULTRASOUND OF ABDOMINAL AORTA FINDINGS: Abdominal aortic measurements as follows: Proximal:  2.6 x 2.5 cm Mid:  2.3 x 2.3 cm Distal:  2.1 x  2.5 cm IMPRESSION: Ectatic abdominal aorta at risk for aneurysm development. Recommend followup by ultrasound in 5 years.          03/03/2023    2:53 PM 10/20/2022    2:48 PM 07/21/2021    2:33 PM 07/15/2020    2:32 PM 06/26/2019    2:56 PM  Depression screen PHQ 2/9  Decreased Interest 0 0 0 0 0  Down, Depressed, Hopeless 0 0 0 0 0  PHQ - 2 Score 0 0 0 0 0  Altered sleeping   2    Tired, decreased energy   0    Change in appetite   0    Feeling bad or failure about yourself    0    Trouble concentrating   0    Moving slowly or fidgety/restless   0    Suicidal thoughts   0    PHQ-9 Score   2      No Known Allergies Social History   Social History Narrative   Married to Paul Phillips. 1 adult child Paul Phillips.   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    Former smoker, quit 2001   Drinks caffeine, takes a daily vitamin.   Wear seatbelt. Smoke detector in the home. Firearms in the home.   safe in his relationships.   Right handed    Past Medical History:  Diagnosis Date   Bell's palsy 12/31/2020   Dislocated thumb    GERD (gastroesophageal reflux disease)    Heart murmur    Hypertension    Osteoarthritis of right hip  Osteoarthritis, multiple sites    hands and hips   Osteonecrosis of right hip (HCC)    pt to get hip replacement by [facility] as of 03/2021   Sleep disturbance 06/22/2018   Past Surgical History:  Procedure Laterality Date   COLONOSCOPY  10/26/2016   TA   right total hip arthroplasty Right 04/11/2021   Family History  Problem Relation Age of Onset   Arthritis Mother    Diabetes Mother    Hearing loss Mother    Stroke Mother    Hearing loss Father    Heart disease Father    AAA (abdominal aortic aneurysm) Father    Aneurysm Sister 7       brain, died from aneurysm suddenly.    Heart attack Brother    Arthritis/Rheumatoid Maternal Grandmother    Brain cancer Maternal Grandfather    Colon cancer Neg Hx    Colon polyps Neg Hx    Esophageal  cancer Neg Hx    Stomach cancer Neg Hx    Rectal cancer Neg Hx    Allergies as of 07/14/2023   No Known Allergies      Medication List        Accurate as of July 14, 2023  2:45 PM. If you have any questions, ask your nurse or doctor.          STOP taking these medications    azithromycin 250 MG tablet Commonly known as: ZITHROMAX Stopped by: Paul Phillips       TAKE these medications    Anoro Ellipta 62.5-25 MCG/ACT Aepb Generic drug: umeclidinium-vilanterol Inhale 1 puff into the lungs daily.   atorvastatin  10 MG tablet Commonly known as: LIPITOR Take 1 tablet (10 mg total) by mouth daily.   cetirizine  10 MG tablet Commonly known as: ZYRTEC  Take 1 tablet (10 mg total) by mouth at bedtime. Started by: Paul Phillips   cyanocobalamin 1000 MCG tablet Commonly known as: VITAMIN B12 Take 2,500 mcg by mouth daily.   diclofenac  75 MG EC tablet Commonly known as: VOLTAREN  Take 1 tablet (75 mg total) by mouth 2 (two) times daily.   lisinopril  30 MG tablet Commonly known as: ZESTRIL  Take 1 tablet (30 mg total) by mouth daily. What changed:  medication strength how much to take Changed by: Paul Phillips   Mens 50+ Multi Vitamin/Min Tabs Take 1 tablet by mouth daily.   Wegovy  2.4 MG/0.75ML Soaj Generic drug: Semaglutide -Weight Management Inject 2.4 mg into the skin once a week. What changed: Another medication with the same name was removed. Continue taking this medication, and follow the directions you see here. Changed by: Paul Phillips        All past medical history, surgical history, allergies, family history, immunizations andmedications were updated in the EMR today and reviewed under the history and medication portions of their EMR.     ROS Negative, with the exception of above mentioned in HPI   Objective:  BP (!) 116/48   Pulse 67   Temp 98.3 F (36.8 C)   Wt 203 lb 6.4 oz (92.3 kg)   SpO2 98%   BMI 33.85 kg/m  Body mass index is 33.85  kg/m. Physical Exam Vitals and nursing note reviewed.  Constitutional:      General: He is not in acute distress.    Appearance: Normal appearance. He is not ill-appearing, toxic-appearing or diaphoretic.  HENT:     Head: Normocephalic and atraumatic.  Eyes:     General: No scleral icterus.  Right eye: No discharge.        Left eye: No discharge.     Extraocular Movements: Extraocular movements intact.     Pupils: Pupils are equal, round, and reactive to light.  Cardiovascular:     Rate and Rhythm: Normal rate and regular rhythm.     Heart sounds: No murmur heard. Pulmonary:     Effort: Pulmonary effort is normal. No respiratory distress.     Breath sounds: Normal breath sounds. No wheezing, rhonchi or rales.  Musculoskeletal:     Right lower leg: No edema.     Left lower leg: No edema.  Skin:    General: Skin is warm.     Findings: No rash.  Neurological:     Mental Status: He is alert and oriented to person, place, and time. Mental status is at baseline.  Psychiatric:        Mood and Affect: Mood normal.        Behavior: Behavior normal.        Thought Content: Thought content normal.        Judgment: Judgment normal.    No results found. No results found. No results found for this or any previous visit (from the past 24 hours).  Assessment/Plan: Yuvin Bussiere is a 73 y.o. male present for OV for  Coronary artery disease involving native coronary artery of native heart without angina pectoris/Morbid obesity with BMI of 40.0-44.9, adult (HCC)/Agatston coronary artery calcium  score between 200 and 399 (259, 63rd%) -Continue atorvastatin  at 10 mg daily may consider increasing after next visit - Continue Wegovy  2.4 mg weekly dose today. -He is seeing improvement in his exercise tolerance already.  He has lost an additional 18 pounds and is feeling more energetic.  He is focusing on a diet of lean meats, fresh fruits and vegetables.  Avoiding all other forms  of carbohydrates and sugars.   -Associated constipation advised him to continue MiraLAX and he could consider adding Senokot nightly  Hypertension/morbid obesity/Statin declinedFamily history of abdominal aortic aneurysm (AAA)/hypertriglycerides:  Looks great decrease lisinopril  40 mg > 30 mg Low sodium diet.  Routine exercise.  - AAA screen q 5 yrs-completed 01/23/2023  Arthritis:  Stable Continue Voltaren . Plts have been stable on medication   Reviewed expectations re: course of current medical issues. Discussed self-management of symptoms. Outlined signs and symptoms indicating need for more acute intervention. Patient verbalized understanding and all questions were answered. Patient received an After-Visit Summary.    Orders Placed This Encounter  Procedures   Comp Met (CMET)   TSH   Direct LDL   Meds ordered this encounter  Medications   diclofenac  (VOLTAREN ) 75 MG EC tablet    Sig: Take 1 tablet (75 mg total) by mouth 2 (two) times daily.    Dispense:  180 tablet    Refill:  1    Please do not send request for renewal or a year supply. If pt needs refills more than prescribed,it is time for patients follow up with the provider and they should call the office for an appt. Thanks   lisinopril  (ZESTRIL ) 30 MG tablet    Sig: Take 1 tablet (30 mg total) by mouth daily.    Dispense:  90 tablet    Refill:  1   WEGOVY  2.4 MG/0.75ML SOAJ    Sig: Inject 2.4 mg into the skin once a week.    Dispense:  3 mL    Refill:  5   atorvastatin  (  LIPITOR) 10 MG tablet    Sig: Take 1 tablet (10 mg total) by mouth daily.    Dispense:  90 tablet    Refill:  1   cetirizine  (ZYRTEC ) 10 MG tablet    Sig: Take 1 tablet (10 mg total) by mouth at bedtime.    Dispense:  90 tablet    Refill:  3   Referral Orders  No referral(s) requested today     Note is dictated utilizing voice recognition software. Although note has been proof read prior to signing, occasional typographical errors  still can be missed. If any questions arise, please do not hesitate to call for verification.   electronically signed by:  Paul Bellini, DO  [facility] [place]

## 2023-07-14 NOTE — Patient Instructions (Addendum)

## 2023-07-15 ENCOUNTER — Other Ambulatory Visit: Payer: Self-pay | Admitting: Family Medicine

## 2023-11-24 DIAGNOSIS — L821 Other seborrheic keratosis: Secondary | ICD-10-CM | POA: Diagnosis not present

## 2023-11-24 DIAGNOSIS — C44319 Basal cell carcinoma of skin of other parts of face: Secondary | ICD-10-CM | POA: Diagnosis not present

## 2023-11-24 DIAGNOSIS — W908XXS Exposure to other nonionizing radiation, sequela: Secondary | ICD-10-CM | POA: Diagnosis not present

## 2023-11-24 DIAGNOSIS — D485 Neoplasm of uncertain behavior of skin: Secondary | ICD-10-CM | POA: Diagnosis not present

## 2023-11-24 DIAGNOSIS — C44311 Basal cell carcinoma of skin of nose: Secondary | ICD-10-CM | POA: Diagnosis not present

## 2023-11-24 DIAGNOSIS — L570 Actinic keratosis: Secondary | ICD-10-CM | POA: Diagnosis not present

## 2023-11-24 DIAGNOSIS — L578 Other skin changes due to chronic exposure to nonionizing radiation: Secondary | ICD-10-CM | POA: Diagnosis not present

## 2023-12-03 ENCOUNTER — Other Ambulatory Visit: Payer: Self-pay | Admitting: Family Medicine

## 2023-12-03 NOTE — Telephone Encounter (Signed)
 Copied from CRM [id number]. Topic: Clinical - Medication Refill >> Dec 03, 2023  4:52 PM Leah C wrote: Medication: atorvastatin  (LIPITOR) 10 MG tablet; lisinopril  (ZESTRIL ) 30 MG tablet   Has the patient contacted their pharmacy? [place] out [place] the [place] and is requesting if he could get a few pills for his cholesterol and blood pressure to hold him over the weekend.   This is the patient's preferred pharmacy:  [id number] - [place], [place] - [id number] [place] [address] Phone: [phone] Fax: [phone]    Is this the correct pharmacy for this prescription? Yes If no, delete pharmacy and type the correct one.   Has the prescription been filled recently? No  Is the patient out of the medication? Yes  Has the patient been seen for an appointment in the last year OR does the patient have an upcoming appointment? Yes  Can we respond through MyChart? No, Patient prefers call back.   Agent: Please be advised that Rx refills may take up to 3 business days. We ask that you follow-up with your pharmacy.

## 2023-12-06 ENCOUNTER — Other Ambulatory Visit: Payer: Self-pay

## 2023-12-06 ENCOUNTER — Telehealth: Payer: Self-pay

## 2023-12-06 MED ORDER — ATORVASTATIN CALCIUM 10 MG PO TABS: 10.0000 mg | ORAL_TABLET | Freq: Every day | ORAL | 0 refills | Status: DC

## 2023-12-06 MED ORDER — LISINOPRIL 30 MG PO TABS: 30.0000 mg | ORAL_TABLET | Freq: Every day | ORAL | 0 refills | Status: DC

## 2023-12-06 NOTE — Telephone Encounter (Signed)
 Communication  Reason for CRM: atorvastatin  (LIPITOR) 10 MG tablet; lisinopril  (ZESTRIL ) 30 MG tablet        Patient Is waiting on medication to be sent to pharmacy. Advised the office is closed but a message can be sent   Rx sent.

## 2024-01-05 DIAGNOSIS — L578 Other skin changes due to chronic exposure to nonionizing radiation: Secondary | ICD-10-CM | POA: Diagnosis not present

## 2024-01-05 DIAGNOSIS — W908XXS Exposure to other nonionizing radiation, sequela: Secondary | ICD-10-CM | POA: Diagnosis not present

## 2024-01-05 DIAGNOSIS — D485 Neoplasm of uncertain behavior of skin: Secondary | ICD-10-CM | POA: Diagnosis not present

## 2024-01-05 DIAGNOSIS — L820 Inflamed seborrheic keratosis: Secondary | ICD-10-CM | POA: Diagnosis not present

## 2024-01-05 DIAGNOSIS — L570 Actinic keratosis: Secondary | ICD-10-CM | POA: Diagnosis not present

## 2024-01-05 DIAGNOSIS — C44329 Squamous cell carcinoma of skin of other parts of face: Secondary | ICD-10-CM | POA: Diagnosis not present

## 2024-01-05 DIAGNOSIS — L821 Other seborrheic keratosis: Secondary | ICD-10-CM | POA: Diagnosis not present

## 2024-01-24 ENCOUNTER — Encounter: Payer: Self-pay | Admitting: Family Medicine

## 2024-01-24 ENCOUNTER — Encounter: Payer: BC Managed Care – PPO | Admitting: Family Medicine

## 2024-01-24 ENCOUNTER — Ambulatory Visit (INDEPENDENT_AMBULATORY_CARE_PROVIDER_SITE_OTHER): Payer: Medicare (Managed Care) | Admitting: Family Medicine

## 2024-01-24 ENCOUNTER — Ambulatory Visit: Payer: Self-pay | Admitting: Family Medicine

## 2024-01-24 VITALS — BP 128/68 | HR 63 | Temp 98.2°F | Wt 192.0 lb

## 2024-01-24 DIAGNOSIS — E781 Pure hyperglyceridemia: Secondary | ICD-10-CM

## 2024-01-24 DIAGNOSIS — S4992XA Unspecified injury of left shoulder and upper arm, initial encounter: Secondary | ICD-10-CM | POA: Diagnosis not present

## 2024-01-24 DIAGNOSIS — I10 Essential (primary) hypertension: Secondary | ICD-10-CM | POA: Diagnosis not present

## 2024-01-24 DIAGNOSIS — S4991XA Unspecified injury of right shoulder and upper arm, initial encounter: Secondary | ICD-10-CM | POA: Diagnosis not present

## 2024-01-24 DIAGNOSIS — Z23 Encounter for immunization: Secondary | ICD-10-CM | POA: Diagnosis not present

## 2024-01-24 DIAGNOSIS — Z6841 Body Mass Index (BMI) 40.0 and over, adult: Secondary | ICD-10-CM

## 2024-01-24 LAB — COMPREHENSIVE METABOLIC PANEL WITH GFR
ALT: 22 U/L (ref 0–53)
AST: 20 U/L (ref 0–37)
Albumin: 4.6 g/dL (ref 3.5–5.2)
Alkaline Phosphatase: 72 U/L (ref 39–117)
BUN: 32 mg/dL — ABNORMAL HIGH (ref 6–23)
CO2: 23 meq/L (ref 19–32)
Calcium: 11.8 mg/dL — ABNORMAL HIGH (ref 8.4–10.5)
Chloride: 108 meq/L (ref 96–112)
Creatinine, Ser: 1.83 mg/dL — ABNORMAL HIGH (ref 0.40–1.50)
GFR: 36.82 mL/min — ABNORMAL LOW (ref >60.00)
Glucose, Bld: 103 mg/dL — ABNORMAL HIGH (ref 70–99)
Potassium: 5.6 meq/L — ABNORMAL HIGH (ref 3.5–5.1)
Sodium: 141 meq/L (ref 135–145)
Total Bilirubin: 1 mg/dL (ref 0.2–1.2)
Total Protein: 7 g/dL (ref 6.0–8.3)

## 2024-01-24 LAB — LIPID PANEL
Cholesterol: 139 mg/dL (ref 0–200)
HDL: 54 mg/dL (ref >39.00)
LDL Cholesterol: 73 mg/dL (ref 0–99)
NonHDL: 84.96
Total CHOL/HDL Ratio: 3
Triglycerides: 61 mg/dL (ref 0.0–149.0)
VLDL: 12.2 mg/dL (ref 0.0–40.0)

## 2024-01-24 LAB — CBC
HCT: 47.7 % (ref 39.0–52.0)
Hemoglobin: 16.2 g/dL (ref 13.0–17.0)
MCHC: 33.9 g/dL (ref 30.0–36.0)
MCV: 88.7 fl (ref 78.0–100.0)
Platelets: 184 K/uL (ref 150.0–400.0)
RBC: 5.37 Mil/uL (ref 4.22–5.81)
RDW: 14.6 % (ref 11.5–15.5)
WBC: 8.5 K/uL (ref 4.0–10.5)

## 2024-01-24 LAB — HEMOGLOBIN A1C: Hgb A1c MFr Bld: 5.3 % (ref 4.6–6.5)

## 2024-01-24 LAB — TSH: TSH: 1.06 u[IU]/mL (ref 0.35–5.50)

## 2024-01-24 MED ORDER — CEPHALEXIN 500 MG PO CAPS: 500.0000 mg | ORAL_CAPSULE | Freq: Two times a day (BID) | ORAL | 0 refills | Status: DC

## 2024-01-24 NOTE — Progress Notes (Unsigned)
 Paul Phillips, Paul Phillips Jul 01, 1951, 73 y.o., male MRN: [id number] Patient Care Team    Relationship Specialty Notifications Start End  Catherine Charlies LABOR, DO PCP - General Family Medicine  06/19/16   Claudene Loving, MD Referring Physician Dermatology  12/21/18   Dina Camie BRAVO, PA-C  Neurology  01/08/21   Skeet Juliene JONELLE, DO Consulting Physician Neurology  01/08/21     Chief Complaint  Patient presents with   Arm Injury    L arm. Injured while at work today. Pt has not applied anything to the wound but is wrapped.      Subjective: Paul Phillips is a 73 y.o. Pt presents for an OV with complaints of left arm injury, that occurred this morning at work.  of *** duration.  Associated symptoms include ***.  Pt has wrapped the wound in guaze     03/03/2023    2:53 PM 10/20/2022    2:48 PM 07/21/2021    2:33 PM 07/15/2020    2:32 PM 06/26/2019    2:56 PM  Depression screen PHQ 2/9  Decreased Interest 0 0 0 0 0  Down, Depressed, Hopeless 0 0 0 0 0  PHQ - 2 Score 0 0 0 0 0  Altered sleeping   2    Tired, decreased energy   0    Change in appetite   0    Feeling bad or failure about yourself    0    Trouble concentrating   0    Moving slowly or fidgety/restless   0    Suicidal thoughts   0    PHQ-9 Score   2      No Known Allergies Social History   Social History Narrative   Married to Paul Phillips. 1 adult child Paul Phillips.   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    Former smoker, quit 2001   Drinks caffeine, takes a daily vitamin.   Wear seatbelt. Smoke detector in the home. Firearms in the home.   safe in his relationships.   Right handed    Past Medical History:  Diagnosis Date   Bell's palsy 12/31/2020   Dislocated thumb    GERD (gastroesophageal reflux disease)    Heart murmur    Hypertension    Osteoarthritis of right hip    Osteoarthritis, multiple sites    hands and hips   Osteonecrosis of right hip (HCC)    pt to get hip replacement by [facility]  as of 03/2021   Sleep disturbance 06/22/2018   Past Surgical History:  Procedure Laterality Date   COLONOSCOPY  10/26/2016   TA   right total hip arthroplasty Right 04/11/2021   Family History  Problem Relation Age of Onset   Arthritis Mother    Diabetes Mother    Hearing loss Mother    Stroke Mother    Hearing loss Father    Heart disease Father    AAA (abdominal aortic aneurysm) Father    Aneurysm Sister 56       brain, died from aneurysm suddenly.    Heart attack Brother    Arthritis/Rheumatoid Maternal Grandmother    Brain cancer Maternal Grandfather    Colon cancer Neg Hx    Colon polyps Neg Hx    Esophageal cancer Neg Hx    Stomach cancer Neg Hx    Rectal cancer Neg Hx    Allergies as of 01/24/2024   No Known Allergies  Medication List        Accurate as of January 24, 2024 11:33 AM. If you have any questions, ask your nurse or doctor.          Anoro Ellipta 62.5-25 MCG/ACT Aepb Generic drug: umeclidinium-vilanterol Inhale 1 puff into the lungs daily.   atorvastatin  10 MG tablet Commonly known as: LIPITOR Take 1 tablet (10 mg total) by mouth daily.   cephALEXin  500 MG capsule Commonly known as: KEFLEX  Take 1 capsule (500 mg total) by mouth 2 (two) times daily for 5 days. Started by: Charlies Bellini   cetirizine  10 MG tablet Commonly known as: ZYRTEC  Take 1 tablet (10 mg total) by mouth at bedtime.   cyanocobalamin 1000 MCG tablet Commonly known as: VITAMIN B12 Take 2,500 mcg by mouth daily.   diclofenac  75 MG EC tablet Commonly known as: VOLTAREN  Take 1 tablet (75 mg total) by mouth 2 (two) times daily.   lisinopril  30 MG tablet Commonly known as: ZESTRIL  Take 1 tablet (30 mg total) by mouth daily.   Mens 50+ Multi Vitamin/Min Tabs Take 1 tablet by mouth daily.   Wegovy  2.4 MG/0.75ML Soaj Generic drug: Semaglutide -Weight Management Inject 2.4 mg into the skin once a week.        All past medical history, surgical history,  allergies, family history, immunizations andmedications were updated in the EMR today and reviewed under the history and medication portions of their EMR.     ROS Negative, with the exception of above mentioned in HPI   Objective:  BP 128/68   Pulse 63   Temp 98.2 F (36.8 C)   Wt 192 lb (87.1 kg)   SpO2 98%   BMI 31.95 kg/m  Body mass index is 31.95 kg/m.  Physical Exam   No results found. No results found. No results found for this or any previous visit (from the past 24 hours).  Assessment/Plan: Paul Phillips is a 73 y.o. male present for OV for  *** - reschedule CPE until next week- will collect his labs today since fasting Address acute injury today - left arm skin avulasion *** right ***  Tdap updated Hiniclens cleaning and presuure dressing.  *** Reviewed expectations re: course of current medical issues. Discussed self-management of symptoms. Outlined signs and symptoms indicating need for more acute intervention. Patient verbalized understanding and all questions were answered. Patient received an After-Visit Summary.    Orders Placed This Encounter  Procedures   Tdap vaccine greater than or equal to 7yo IM   CBC   Comp Met (CMET)   TSH   Hemoglobin A1c   Lipid panel   Meds ordered this encounter  Medications   cephALEXin  (KEFLEX ) 500 MG capsule    Sig: Take 1 capsule (500 mg total) by mouth 2 (two) times daily for 5 days.    Dispense:  10 capsule    Refill:  0   Referral Orders  No referral(s) requested today     Note is dictated utilizing voice recognition software. Although note has been proof read prior to signing, occasional typographical errors still can be missed. If any questions arise, please do not hesitate to call for verification.   electronically signed by:  Charlies Bellini, DO  [facility] [place]

## 2024-01-24 NOTE — Patient Instructions (Addendum)
 Return in about 1 week (around 01/31/2024) for cpe (20 min), Routine chronic condition follow-up.  Wound follow up      Great to see you today.  I have refilled the medication(s) we provide.   If labs were collected or images ordered, we will inform you of  results once we have received them and reviewed. We will contact you either by echart message, or telephone call.  Please give ample time to the testing facility, and our office to run,  receive and review results. Please do not call inquiring of results, even if you can see them in your chart. We will contact you as soon as we are able. If it has been over 1 week since the test was completed, and you have not yet heard from us , then please call us .    - echart message- for normal results that have been seen by the patient already.   - telephone call: abnormal results or if patient has not viewed results in their echart.  If a referral to a specialist was entered for you, please call us  in 2 weeks if you have not heard from the specialist office to schedule.

## 2024-01-24 NOTE — Telephone Encounter (Signed)
 Please call patient Due to his injury, we rescheduled his physical till next week, but we did give him factors labs today which we will review in more detail at his appointment next week.  However his kidney function is severely declined since last check in January. Please advise him to increase his water consumption at least 80 ounces daily and we will need to recheck this particular lab again next week.  His kidney function is about half of what it was 6 months ago.

## 2024-01-31 ENCOUNTER — Encounter: Payer: Self-pay | Admitting: Family Medicine

## 2024-01-31 ENCOUNTER — Ambulatory Visit (INDEPENDENT_AMBULATORY_CARE_PROVIDER_SITE_OTHER): Payer: Medicare (Managed Care) | Admitting: Family Medicine

## 2024-01-31 VITALS — BP 118/66 | HR 69 | Temp 98.1°F | Ht 65.0 in | Wt 192.3 lb

## 2024-01-31 DIAGNOSIS — Z125 Encounter for screening for malignant neoplasm of prostate: Secondary | ICD-10-CM

## 2024-01-31 DIAGNOSIS — E781 Pure hyperglyceridemia: Secondary | ICD-10-CM

## 2024-01-31 DIAGNOSIS — Z0000 Encounter for general adult medical examination without abnormal findings: Secondary | ICD-10-CM

## 2024-01-31 DIAGNOSIS — R931 Abnormal findings on diagnostic imaging of heart and coronary circulation: Secondary | ICD-10-CM | POA: Diagnosis not present

## 2024-01-31 DIAGNOSIS — M1990 Unspecified osteoarthritis, unspecified site: Secondary | ICD-10-CM

## 2024-01-31 DIAGNOSIS — E875 Hyperkalemia: Secondary | ICD-10-CM

## 2024-01-31 DIAGNOSIS — I77810 Thoracic aortic ectasia: Secondary | ICD-10-CM | POA: Diagnosis not present

## 2024-01-31 DIAGNOSIS — Z6841 Body Mass Index (BMI) 40.0 and over, adult: Secondary | ICD-10-CM | POA: Diagnosis not present

## 2024-01-31 DIAGNOSIS — N179 Acute kidney failure, unspecified: Secondary | ICD-10-CM

## 2024-01-31 DIAGNOSIS — I10 Essential (primary) hypertension: Secondary | ICD-10-CM | POA: Diagnosis not present

## 2024-01-31 DIAGNOSIS — Z8249 Family history of ischemic heart disease and other diseases of the circulatory system: Secondary | ICD-10-CM

## 2024-01-31 DIAGNOSIS — Z87891 Personal history of nicotine dependence: Secondary | ICD-10-CM

## 2024-01-31 DIAGNOSIS — K635 Polyp of colon: Secondary | ICD-10-CM

## 2024-01-31 LAB — BASIC METABOLIC PANEL WITH GFR
BUN: 23 mg/dL (ref 6–23)
CO2: 22 meq/L (ref 19–32)
Calcium: 10.3 mg/dL (ref 8.4–10.5)
Chloride: 107 meq/L (ref 96–112)
Creatinine, Ser: 1.11 mg/dL (ref 0.40–1.50)
GFR: 67.08 mL/min (ref >60.00)
Glucose, Bld: 87 mg/dL (ref 70–99)
Potassium: 5 meq/L (ref 3.5–5.1)
Sodium: 137 meq/L (ref 135–145)

## 2024-01-31 MED ORDER — ATORVASTATIN CALCIUM 10 MG PO TABS: 10.0000 mg | ORAL_TABLET | Freq: Every day | ORAL | 3 refills | Status: AC

## 2024-01-31 MED ORDER — LISINOPRIL 30 MG PO TABS: 30.0000 mg | ORAL_TABLET | Freq: Every day | ORAL | 1 refills | Status: AC

## 2024-01-31 MED ORDER — DICLOFENAC SODIUM 75 MG PO TBEC: 75.0000 mg | DELAYED_RELEASE_TABLET | Freq: Two times a day (BID) | ORAL | 1 refills | Status: AC

## 2024-01-31 MED ORDER — TAMSULOSIN HCL 0.4 MG PO CAPS: 0.4000 mg | ORAL_CAPSULE | Freq: Every day | ORAL | 1 refills | Status: AC

## 2024-01-31 MED ORDER — WEGOVY 2.4 MG/0.75ML ~~LOC~~ SOAJ: 2.4000 mg | SUBCUTANEOUS | 5 refills | Status: AC

## 2024-01-31 NOTE — Progress Notes (Signed)
 Date      Paul Phillips, Paul Phillips Jul 21, 1950, 73 y.o., male MRN: [id number] Patient Care Team    Relationship Specialty Notifications Start End  Catherine Charlies LABOR, DO PCP - General Family Medicine  06/19/16   Claudene Loving, MD Referring Physician Dermatology  12/21/18   Dina Camie BRAVO, PA-C  Neurology  01/08/21   Skeet Juliene JONELLE, DO Consulting Physician Neurology  01/08/21   Cloretta Plough    01/31/24     Chief Complaint  Patient presents with   Annual Exam    Chronic condition management.       Subjective: Paul Phillips is a 73 y.o. Pt presents for an OV for management of chronic conditions.   Health maintenance:  Colonoscopy: Completed 2021, by Dr. Aneita. Multiple polyps, 5 year follow-up recommended.  Immunizations:  tdap UTD 01/2024, influenza UTD,PNA series completed, shingrix series completed.  Infectious disease screening: HIV and Hep C negative (completed) PSA: collected today Lab Results  Component Value Date   PSA 1.65 01/20/2023   PSA 1.38 07/21/2021   PSA 0.93 07/15/2020   , pt was counseled on prostate cancer screenings Assistive device: none Oxygen [id number] Patient has a Dental home. Hospitalizations/ED visits: Reviewed  HTN/HLD/Morbid obesity/thrombocytopenia: Pt reports compliance with lisinopril  30 mg QD .   Patient is tolerating Wegovy  2.4 weekly.  He is having some constipation but is taking MiraLAX which is helping. Patient denies chest pain, shortness of breath, dizziness or lower extremity edema.   Pt takes a daily baby ASA. Pt is  prescribed statin Diet: Started watching his diet very closely. Exercise: Is exercising routinely.   RF: Hypertension, hypertriglyceridemia, former smoker, strong family history of heart disease, stroke and aneurysms  Coronary Calcium  Score:259 Percentile: 63rd percentile  Ascending Aorta: Mildly dilated measuring 42mm at the bifurcation of the main pulmonary artery.    Arthritis:  Patient complaining with  diclofenac  twice daily.  Patient feels medication works well.  Narrative & Impression  01/27/2023 EXAM: ULTRASOUND OF ABDOMINAL AORTA FINDINGS: Abdominal aortic measurements as follows: Proximal:  2.3 x 2.5 cm Mid:  2.1 x 2.4 cm Distal:  1.9 x 2.4 cm Patent: Yes, peak systolic velocity is 45 cm/s Right common iliac artery: 1.3 cm Left common iliac artery: 1.3 cm IMPRESSION: No evidence of abdominal aortic aneurysm.  03/21/2018: ULTRASOUND OF ABDOMINAL AORTA FINDINGS: Abdominal aortic measurements as follows: Proximal:  2.6 x 2.5 cm Mid:  2.3 x 2.3 cm Distal:  2.1 x 2.5 cm IMPRESSION: Ectatic abdominal aorta at risk for aneurysm development. Recommend followup by ultrasound in 5 years.          01/31/2024   10:57 AM 03/03/2023    2:53 PM 10/20/2022    2:48 PM 07/21/2021    2:33 PM 07/15/2020    2:32 PM  Depression screen PHQ 2/9  Decreased Interest 0 0 0 0 0  Down, Depressed, Hopeless 0 0 0 0 0  PHQ - 2 Score 0 0 0 0 0  Altered sleeping 0   2   Tired, decreased energy 0   0   Change in appetite 0   0   Feeling bad or failure about yourself  0   0   Trouble concentrating 0   0   Moving slowly or fidgety/restless 0   0   Suicidal thoughts 0   0   PHQ-9 Score 0   2   Difficult doing work/chores Not difficult at all  No Known Allergies Social History   Social History Narrative   Married to [place]. 1 adult child [place].   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    Former smoker, quit 2001   Drinks caffeine, takes a daily vitamin.   Wear seatbelt. Smoke detector in the home. Firearms in the home.   safe in his relationships.   Right handed    Past Medical History:  Diagnosis Date   Bell's palsy 12/31/2020   Dislocated thumb    GERD (gastroesophageal reflux disease)    Heart murmur    Hypertension    Osteoarthritis of right hip    Osteoarthritis, multiple sites    hands and hips   Osteonecrosis of right hip (HCC)    pt to get hip replacement by  [facility] as of 03/2021   Sleep disturbance 06/22/2018   Past Surgical History:  Procedure Laterality Date   COLONOSCOPY  10/26/2016   TA   right total hip arthroplasty Right 04/11/2021   Family History  Problem Relation Age of Onset   Arthritis Mother    Diabetes Mother    Hearing loss Mother    Stroke Mother    Hearing loss Father    Heart disease Father    AAA (abdominal aortic aneurysm) Father    Aneurysm Sister 56       brain, died from aneurysm suddenly.    Heart attack Brother    Arthritis/Rheumatoid Maternal Grandmother    Brain cancer Maternal Grandfather    Colon cancer Neg Hx    Colon polyps Neg Hx    Esophageal cancer Neg Hx    Stomach cancer Neg Hx    Rectal cancer Neg Hx    Allergies as of 01/31/2024   No Known Allergies      Medication List        Accurate as of January 31, 2024 11:22 AM. If you have any questions, ask your nurse or doctor.          STOP taking these medications    cephALEXin  500 MG capsule Commonly known as: KEFLEX  Stopped by: Charlies Bellini       TAKE these medications    Anoro Ellipta 62.5-25 MCG/ACT Aepb Generic drug: umeclidinium-vilanterol Inhale 1 puff into the lungs daily.   atorvastatin  10 MG tablet Commonly known as: LIPITOR Take 1 tablet (10 mg total) by mouth daily.   cetirizine  10 MG tablet Commonly known as: ZYRTEC  Take 1 tablet (10 mg total) by mouth at bedtime.   cyanocobalamin 1000 MCG tablet Commonly known as: VITAMIN B12 Take 2,500 mcg by mouth daily.   diclofenac  75 MG EC tablet Commonly known as: VOLTAREN  Take 1 tablet (75 mg total) by mouth 2 (two) times daily.   lisinopril  30 MG tablet Commonly known as: ZESTRIL  Take 1 tablet (30 mg total) by mouth daily.   Mens 50+ Multi Vitamin/Min Tabs Take 1 tablet by mouth daily.   tamsulosin  0.4 MG Caps capsule Commonly known as: FLOMAX  Take 1 capsule (0.4 mg total) by mouth daily. Started by: Charlies Bellini   Wegovy  2.4 MG/0.75ML Soaj Generic  drug: Semaglutide -Weight Management Inject 2.4 mg into the skin once a week.        All past medical history, surgical history, allergies, family history, immunizations andmedications were updated in the EMR today and reviewed under the history and medication portions of their EMR.     ROS Negative, with the exception of above mentioned in HPI   Objective:  BP 118/66  Pulse 69   Temp 98.1 F (36.7 C)   Ht 5' 5 (1.651 m)   Wt 192 lb 4.8 oz (87.2 kg)   SpO2 98%   BMI 32.00 kg/m  Body mass index is 32 kg/m. Physical Exam Vitals and nursing note reviewed. Exam conducted with a chaperone present.  Constitutional:      General: He is not in acute distress.    Appearance: Normal appearance. He is obese. He is not ill-appearing, toxic-appearing or diaphoretic.  HENT:     Head: Normocephalic and atraumatic.     Right Ear: Tympanic membrane, ear canal and external ear normal. There is no impacted cerumen.     Left Ear: Tympanic membrane, ear canal and external ear normal. There is no impacted cerumen.     Nose: Nose normal. No congestion or rhinorrhea.     Mouth/Throat:     Mouth: Mucous membranes are moist.     Pharynx: Oropharynx is clear. No oropharyngeal exudate or posterior oropharyngeal erythema.  Eyes:     General: No scleral icterus.       Right eye: No discharge.        Left eye: No discharge.     Extraocular Movements: Extraocular movements intact.     Pupils: Pupils are equal, round, and reactive to light.  Cardiovascular:     Rate and Rhythm: Normal rate and regular rhythm.     Pulses: Normal pulses.     Heart sounds: Normal heart sounds. No murmur heard.    No friction rub. No gallop.  Pulmonary:     Effort: Pulmonary effort is normal. No respiratory distress.     Breath sounds: Normal breath sounds. No stridor. No wheezing, rhonchi or rales.  Chest:     Chest wall: No tenderness.  Abdominal:     General: Abdomen is flat. Bowel sounds are normal. There is  no distension.     Palpations: Abdomen is soft. There is no mass.     Tenderness: There is no abdominal tenderness. There is no right CVA tenderness, left CVA tenderness, guarding or rebound.     Hernia: No hernia is present.  Musculoskeletal:        General: No swelling or tenderness. Normal range of motion.     Cervical back: Normal range of motion and neck supple.     Right lower leg: No edema.     Left lower leg: No edema.  Lymphadenopathy:     Cervical: No cervical adenopathy.  Skin:    General: Skin is warm and dry.     Coloration: Skin is not jaundiced.     Findings: No bruising, lesion or rash.     Comments: Bilateral arm abrasions healing well, no erythema or drainage  Neurological:     General: No focal deficit present.     Mental Status: He is alert and oriented to person, place, and time. Mental status is at baseline.     Cranial Nerves: No cranial nerve deficit.     Sensory: No sensory deficit.     Motor: No weakness.     Coordination: Coordination normal.     Gait: Gait normal.     Deep Tendon Reflexes: Reflexes normal.  Psychiatric:        Mood and Affect: Mood normal.        Behavior: Behavior normal.        Thought Content: Thought content normal.        Judgment: Judgment normal.  No results found. No results found. No results found for this or any previous visit (from the past 24 hours).  Assessment/Plan: Paul Phillips is a 73 y.o. male present for OV for CPE and Chronic Conditions/illness Management Coronary artery disease involving native coronary artery of native heart without angina pectoris/Morbid obesity with BMI of 40.0-44.9, adult (HCC)/[place] coronary artery calcium  score between 200 and 399 (259, 63rd%) - continue  atorvastatin  at 10 mg daily  - continue Wegovy  2.4 mg weekly dose today. Reviewed lipid results.   Hypertension/morbid obesity/Statin declinedFamily history of abdominal aortic aneurysm (AAA)/hypertriglycerides:   stable Continue lisinopril  30 mg Low sodium diet.  Routine exercise.  - AAA screen q 5 yrs-completed 01/23/2023  Arthritis:  Stable Continue  Voltaren . Plts have been stable on medication> may need to hold if kidney fx still low.   Polyp of colon, unspecified part of colon, unspecified type Colonoscopy due next year  Hypercalcemia/Hyperkalemia/AKI Uncertain cause- he has made sure he is hydrating well this past week.  - Basic Metabolic Panel (BMET) - PTH, Intact and Calcium  - Vitamin D  (25 hydroxy)  Prostate cancer screening - PSA, Medicare ( [facility] only)  Routine general medical examination at a health care facility (Primary) Patient was encouraged to exercise greater than 150 minutes a week. Patient was encouraged to choose a diet filled with fresh fruits and vegetables, and lean meats. AVS provided to patient today for education/recommendation on gender specific health and safety maintenance. Colonoscopy: Completed 2021, by Dr. Aneita. Multiple polyps, 5 year follow-up recommended.  Immunizations:  tdap UTD 01/2024, influenza UTD,PNA series completed, shingrix series completed.  Infectious disease screening: HIV and Hep C negative (completed) PSA: collected today    Reviewed expectations re: course of current medical issues. Discussed self-management of symptoms. Outlined signs and symptoms indicating need for more acute intervention. Patient verbalized understanding and all questions were answered. Patient received an After-Visit Summary.    Orders Placed This Encounter  Procedures   Basic Metabolic Panel (BMET)   PTH, Intact and Calcium    Vitamin D  (25 hydroxy)   PSA, Medicare ( [facility] only)   Meds ordered this encounter  Medications   diclofenac  (VOLTAREN ) 75 MG EC tablet    Sig: Take 1 tablet (75 mg total) by mouth 2 (two) times daily.    Dispense:  180 tablet    Refill:  1    Please do not send request for renewal or a year supply. If pt  needs refills more than prescribed,it is time for patients follow up with the provider and they should call the office for an appt. Thanks   lisinopril  (ZESTRIL ) 30 MG tablet    Sig: Take 1 tablet (30 mg total) by mouth daily.    Dispense:  90 tablet    Refill:  1   WEGOVY  2.4 MG/0.75ML SOAJ    Sig: Inject 2.4 mg into the skin once a week.    Dispense:  3 mL    Refill:  5   atorvastatin  (LIPITOR) 10 MG tablet    Sig: Take 1 tablet (10 mg total) by mouth daily.    Dispense:  90 tablet    Refill:  3   tamsulosin  (FLOMAX ) 0.4 MG CAPS capsule    Sig: Take 1 capsule (0.4 mg total) by mouth daily.    Dispense:  90 capsule    Refill:  1   Referral Orders  No referral(s) requested today     Note is dictated utilizing voice  recognition software. Although note has been proof read prior to signing, occasional typographical errors still can be missed. If any questions arise, please do not hesitate to call for verification.   electronically signed by:  Charlies Bellini, DO  [facility] - [place]

## 2024-02-01 ENCOUNTER — Ambulatory Visit: Payer: Self-pay | Admitting: Family Medicine

## 2024-02-01 LAB — PTH, INTACT AND CALCIUM
Calcium: 10.3 mg/dL (ref 8.6–10.3)
PTH: 34 pg/mL (ref 16–77)

## 2024-02-01 LAB — PSA, MEDICARE: PSA: 1.75 ng/mL (ref 0.10–4.00)

## 2024-02-01 LAB — VITAMIN D 25 HYDROXY (VIT D DEFICIENCY, FRACTURES): VITD: 28.56 ng/mL — ABNORMAL LOW (ref 30.00–100.00)

## 2024-02-07 DIAGNOSIS — C44329 Squamous cell carcinoma of skin of other parts of face: Secondary | ICD-10-CM | POA: Diagnosis not present

## 2024-02-21 DIAGNOSIS — C44319 Basal cell carcinoma of skin of other parts of face: Secondary | ICD-10-CM | POA: Diagnosis not present

## 2024-03-09 DIAGNOSIS — C44311 Basal cell carcinoma of skin of nose: Secondary | ICD-10-CM | POA: Diagnosis not present

## 2024-03-09 DIAGNOSIS — D049 Carcinoma in situ of skin, unspecified: Secondary | ICD-10-CM | POA: Diagnosis not present

## 2024-04-03 DIAGNOSIS — L578 Other skin changes due to chronic exposure to nonionizing radiation: Secondary | ICD-10-CM | POA: Diagnosis not present

## 2024-04-03 DIAGNOSIS — Z08 Encounter for follow-up examination after completed treatment for malignant neoplasm: Secondary | ICD-10-CM | POA: Diagnosis not present

## 2024-04-03 DIAGNOSIS — Z85828 Personal history of other malignant neoplasm of skin: Secondary | ICD-10-CM | POA: Diagnosis not present

## 2024-04-03 DIAGNOSIS — W908XXS Exposure to other nonionizing radiation, sequela: Secondary | ICD-10-CM | POA: Diagnosis not present

## 2024-04-17 ENCOUNTER — Telehealth: Payer: Self-pay

## 2024-04-17 ENCOUNTER — Other Ambulatory Visit (HOSPITAL_COMMUNITY): Payer: Self-pay

## 2024-04-17 NOTE — Telephone Encounter (Signed)
 Pharmacy Patient Advocate Encounter   Received notification from CoverMyMeds that prior authorization for Wegovy  2.4MG /0.75ML auto-injectors is required/requested.   Insurance verification completed.   The patient is insured through [facility]   Per test claim: PA required; PA submitted to above mentioned insurance via Latent Key/confirmation #/EOC [employer] Status is pending

## 2024-04-18 ENCOUNTER — Other Ambulatory Visit (HOSPITAL_COMMUNITY): Payer: Self-pay

## 2024-04-19 NOTE — Telephone Encounter (Signed)
 Pharmacy Patient Advocate Encounter  Received notification from [facility] that Prior Authorization for Wegovy  2.4MG /0.75ML auto-injectors   has been APPROVED from 04/18/2024 to 04/18/2025 PLEASE BE ADVISED  A LETTER OF APPROVAL HAS BEEN UPLOADED TO MEDIA   PA #/Case ID/Reference #: [id number]

## 2024-04-19 NOTE — Telephone Encounter (Signed)
 Pt advised.

## 2024-05-17 ENCOUNTER — Ambulatory Visit: Payer: Medicare (Managed Care)

## 2024-05-17 VITALS — Ht 65.0 in | Wt 192.0 lb

## 2024-05-17 DIAGNOSIS — Z0000 Encounter for general adult medical examination without abnormal findings: Secondary | ICD-10-CM | POA: Diagnosis not present

## 2024-05-17 NOTE — Patient Instructions (Addendum)
 [name],  Thank you for taking the time for your Medicare Wellness Visit. I appreciate your continued commitment to your health goals. Please review the care plan we discussed, and feel free to reach out if I can assist you further.  Please note that Annual Wellness Visits do not include a physical exam. Some assessments may be limited, especially if the visit was conducted virtually. If needed, we may recommend an in-person follow-up with your provider.  Ongoing Care Seeing your primary care provider every 3 to 6 months helps us  monitor your health and provide consistent, personalized care. Next office visit on 07/25/24.   Keep up the good work.   Referrals If a referral was made during today's visit and you haven't received any updates within two weeks, please contact the referred provider directly to check on the status.  Recommended Screenings:  Health Maintenance  Topic Date Due   Flu Shot  02/04/2024   Colon Cancer Screening  10/22/2024   Medicare Annual Wellness Visit  05/17/2025   DTaP/Tdap/Td vaccine (3 - Td or Tdap) 01/23/2034   Pneumococcal Vaccine for age over 64  Completed   Hepatitis C Screening  Completed   Zoster (Shingles) Vaccine  Completed   Meningitis B Vaccine  Aged Out   Hepatitis B Vaccine  Discontinued   COVID-19 Vaccine  Discontinued       05/14/2024    2:38 PM  Advanced Directives  Does Patient Have a Medical Advance Directive? Yes  Type of Estate Agent of [place];Living will    Vision: Annual vision screenings are recommended for early detection of glaucoma, cataracts, and diabetic retinopathy. These exams can also reveal signs of chronic conditions such as diabetes and high blood pressure.  Dental: Annual dental screenings help detect early signs of oral cancer, gum disease, and other conditions linked to overall health, including heart disease and diabetes.  Please see the attached documents for additional preventive  care recommendations.

## 2024-05-17 NOTE — Progress Notes (Signed)
 Chief Complaint  Patient presents with   Medicare Wellness     Subjective:   Paul Phillips is a 73 y.o. male who presents for a Medicare Annual Wellness Visit.  Allergies (verified) Patient has no known allergies.   History: Past Medical History:  Diagnosis Date   Bell's palsy 12/31/2020   Dislocated thumb    GERD (gastroesophageal reflux disease)    Heart murmur    Hypertension    Osteoarthritis of right hip    Osteoarthritis, multiple sites    hands and hips   Osteonecrosis of right hip (HCC)    pt to get hip replacement by [facility] as of 03/2021   Sleep disturbance 06/22/2018   Past Surgical History:  Procedure Laterality Date   COLONOSCOPY  10/26/2016   TA   right total hip arthroplasty Right 04/11/2021   Family History  Problem Relation Age of Onset   Arthritis Mother    Diabetes Mother    Hearing loss Mother    Stroke Mother    Hearing loss Father    Heart disease Father    AAA (abdominal aortic aneurysm) Father    Aneurysm Sister 56       brain, died from aneurysm suddenly.    Heart attack Brother    Arthritis/Rheumatoid Maternal Grandmother    Brain cancer Maternal Grandfather    Colon cancer Neg Hx    Colon polyps Neg Hx    Esophageal cancer Neg Hx    Stomach cancer Neg Hx    Rectal cancer Neg Hx    Social History   Occupational History   Occupation: Personnel Officer  Tobacco Use   Smoking status: Former    Current packs/day: 0.00    Average packs/day: 1 pack/day for 30.0 years (30.0 ttl pk-yrs)    Types: Cigarettes    Start date: 07/06/1969    Quit date: 07/07/1999    Years since quitting: 24.8   Smokeless tobacco: Never  Vaping Use   Vaping status: Never Used  Substance and Sexual Activity   Alcohol use: Yes    Alcohol/week: 4.0 standard drinks of alcohol    Types: 4 Cans of beer per week   Drug use: No   Sexual activity: Yes    Partners: Female    Comment: married   Tobacco Counseling Counseling given: Not  Answered  SDOH Screenings   Food Insecurity: No Food Insecurity (05/14/2024)  Housing: Low Risk  (05/14/2024)  Transportation Needs: No Transportation Needs (05/14/2024)  Utilities: Not At Risk (03/02/2023)  Alcohol Screen: Low Risk  (05/14/2024)  Depression (PHQ2-9): Low Risk  (01/31/2024)  Financial Resource Strain: Low Risk  (05/14/2024)  Physical Activity: Insufficiently Active (05/14/2024)  Social Connections: Moderately Integrated (05/14/2024)  Stress: No Stress Concern Present (05/14/2024)  Tobacco Use: Medium Risk (05/17/2024)  Health Literacy: Adequate Health Literacy (03/03/2023)   See flowsheets for full screening details  Depression Screen PHQ 2 & 9 Depression Scale- Over the past 2 weeks, how often have you been bothered by any of the following problems? Little interest or pleasure in doing things: 0 Feeling down, depressed, or hopeless (PHQ Adolescent also includes...irritable): 0 PHQ-2 Total Score: 0 Trouble falling or staying asleep, or sleeping too much: 0 Feeling tired or having little energy: 0 Poor appetite or overeating (PHQ Adolescent also includes...weight loss): 0 Feeling bad about yourself - or that you are a failure or have let yourself or your family down: 0 Trouble concentrating on things, such as reading the newspaper  or watching television (PHQ Adolescent also includes...like school work): 0 Moving or speaking so slowly that other people could have noticed. Or the opposite - being so fidgety or restless that you have been moving around a lot more than usual: 0 Thoughts that you would be better off dead, or of hurting yourself in some way: 0 PHQ-9 Total Score: 0 If you checked off any problems, how difficult have these problems made it for you to do your work, take care of things at home, or get along with other people?: Not difficult at all     Goals Addressed   None    Visit info / Clinical Intake: Medicare Wellness Visit Type:: Subsequent Annual Wellness  Visit Persons participating in visit:: patient Medicare Wellness Visit Mode:: Telephone If telephone:: video declined Because this visit was a virtual/telehealth visit:: vitals recorded from last visit If Telephone or Video please confirm:: I connected with the patient using audio enabled telemedicine application and verified that I am speaking with the correct person using two identifiers; I discussed the limitations of evaluation and management by telemedicine; The patient expressed understanding and agreed to proceed Patient Location:: Home Provider Location:: Home Information given by:: patient Interpreter Needed?: No Pre-visit prep was completed: yes AWV questionnaire completed by patient prior to visit?: yes Date:: 05/14/24 Living arrangements:: lives with spouse/significant other Patient's Overall Health Status Rating: very good Typical amount of pain: none Does pain affect daily life?: no Are you currently prescribed opioids?: no  Dietary Habits and Nutritional Risks How many meals a day?: 3 Eats fruit and vegetables daily?: yes Most meals are obtained by: preparing own meals In the last 2 weeks, have you had any of the following?: none Diabetic:: no  Functional Status Activities of Daily Living (to include ambulation/medication): (Patient-Rptd) Independent Ambulation: Independent with device- listed below Home Assistive Devices/Equipment: Contact lenses; Eyeglasses Medication Administration: Independent Home Management: (Patient-Rptd) Independent Manage your own finances?: yes Primary transportation is: driving Concerns about vision?: no *vision screening is required for WTM* Concerns about hearing?: no  Fall Screening Falls in the past year?: (Patient-Rptd) 0 Number of falls in past year: 0 Was there an injury with Fall?: 0 Fall Risk Category Calculator: 0 Patient Fall Risk Level: Low Fall Risk  Fall Risk Patient at Risk for Falls Due to: No Fall Risks Fall  risk Follow up: Falls evaluation completed; Falls prevention discussed  Home and Transportation Safety: All rugs have non-skid backing?: yes All stairs or steps have railings?: N/A, no stairs Grab bars in the bathtub or shower?: yes Have non-skid surface in bathtub or shower?: yes Good home lighting?: yes Regular seat belt use?: yes Hospital stays in the last year:: no  Cognitive Assessment Difficulty concentrating, remembering, or making decisions? : yes (remembering) Will 6CIT or Mini Cog be Completed: yes What year is it?: 0 points Give patient an address phrase to remember (5 components): [address] [place], [place]        Objective:    Today's Vitals   05/17/24 1532  Weight: 192 lb (87.1 kg)  Height: 5' 5 (1.651 m)   Body mass index is 31.95 kg/m.  Current Medications (verified) Outpatient Encounter Medications as of 05/17/2024  Medication Sig   atorvastatin  (LIPITOR) 10 MG tablet Take 1 tablet (10 mg total) by mouth daily.   cetirizine  (ZYRTEC ) 10 MG tablet Take 1 tablet (10 mg total) by mouth at bedtime.   diclofenac  (VOLTAREN ) 75 MG EC tablet Take 1 tablet (75 mg total)  by mouth 2 (two) times daily.   lisinopril  (ZESTRIL ) 30 MG tablet Take 1 tablet (30 mg total) by mouth daily.   Multiple Vitamins-Minerals (MENS 50+ MULTI VITAMIN/MIN) TABS Take 1 tablet by mouth daily.   tamsulosin  (FLOMAX ) 0.4 MG CAPS capsule Take 1 capsule (0.4 mg total) by mouth daily.   vitamin B-12 (CYANOCOBALAMIN) 1000 MCG tablet Take 2,500 mcg by mouth daily.    WEGOVY  2.4 MG/0.75ML SOAJ Inject 2.4 mg into the skin once a week.   ANORO ELLIPTA 62.5-25 MCG/ACT AEPB Inhale 1 puff into the lungs daily.   No facility-administered encounter medications on file as of 05/17/2024.   Hearing/Vision screen Hearing Screening - Comments:: Denies hearing difficulties   Immunizations and Health Maintenance Health Maintenance  Topic Date Due   Influenza Vaccine  02/04/2024   Medicare Annual Wellness  (AWV)  03/02/2024   Colonoscopy  10/22/2024   DTaP/Tdap/Td (3 - Td or Tdap) 01/23/2034   Pneumococcal Vaccine: 50+ Years  Completed   Hepatitis C Screening  Completed   Zoster Vaccines- Shingrix  Completed   Meningococcal B Vaccine  Aged Out   Hepatitis B Vaccines 19-59 Average Risk  Discontinued   COVID-19 Vaccine  Discontinued        Assessment/Plan:  This is a routine wellness examination for [place].  Patient Care Team: Catherine Charlies LABOR, DO as PCP - General (Family Medicine) Claudene Loving, MD as Referring Physician (Dermatology) Wertman, Sara E, PA-C (Neurology) Skeet Juliene SAUNDERS, DO as Consulting Physician (Neurology) Cloretta Plough  I have personally reviewed and noted the following in the patient's chart:   Medical and social history Use of alcohol, tobacco or illicit drugs  Current medications and supplements including opioid prescriptions. Functional ability and status Nutritional status Physical activity Advanced directives List of other physicians Hospitalizations, surgeries, and ER visits in previous 12 months Vitals Screenings to include cognitive, depression, and falls Referrals and appointments  No orders of the defined types were placed in this encounter.  In addition, I have reviewed and discussed with patient certain preventive protocols, quality metrics, and best practice recommendations. A written personalized care plan for preventive services as well as general preventive health recommendations were provided to patient.   Tehya Leath L Mahi Zabriskie, CMA   05/17/2024   No follow-ups on file.  After Visit Summary: (MyChart) Due to this being a telephonic visit, the after visit summary with patients personalized plan was offered to patient via MyChart   Nurse Notes: Patient is up to date on all health maintenance with no concerns to address today.

## 2024-06-07 DIAGNOSIS — Z86007 Personal history of in-situ neoplasm of skin: Secondary | ICD-10-CM | POA: Diagnosis not present

## 2024-06-07 DIAGNOSIS — L905 Scar conditions and fibrosis of skin: Secondary | ICD-10-CM | POA: Diagnosis not present

## 2024-06-07 DIAGNOSIS — Z08 Encounter for follow-up examination after completed treatment for malignant neoplasm: Secondary | ICD-10-CM | POA: Diagnosis not present

## 2024-06-24 ENCOUNTER — Other Ambulatory Visit: Payer: Self-pay | Admitting: Family Medicine

## 2024-07-25 ENCOUNTER — Ambulatory Visit: Payer: Medicare (Managed Care) | Admitting: Family Medicine

## 2024-08-01 ENCOUNTER — Ambulatory Visit: Payer: Medicare (Managed Care) | Admitting: Family Medicine

## 2024-08-05 ENCOUNTER — Other Ambulatory Visit: Payer: Self-pay | Admitting: Family Medicine
